# Patient Record
Sex: Male | Born: 1945 | Race: White | Hispanic: No | Marital: Married | Smoking: Never smoker
Health system: Southern US, Community
[De-identification: ages and names within clinical notes are randomized; demographics above are authoritative.]

## PROBLEM LIST (undated history)

## (undated) DIAGNOSIS — G629 Polyneuropathy, unspecified: Secondary | ICD-10-CM

## (undated) DIAGNOSIS — R011 Cardiac murmur, unspecified: Secondary | ICD-10-CM

## (undated) DIAGNOSIS — I359 Nonrheumatic aortic valve disorder, unspecified: Secondary | ICD-10-CM

## (undated) DIAGNOSIS — G8929 Other chronic pain: Secondary | ICD-10-CM

## (undated) DIAGNOSIS — E785 Hyperlipidemia, unspecified: Secondary | ICD-10-CM

## (undated) DIAGNOSIS — G4730 Sleep apnea, unspecified: Secondary | ICD-10-CM

## (undated) DIAGNOSIS — R519 Headache, unspecified: Secondary | ICD-10-CM

## (undated) DIAGNOSIS — M1711 Unilateral primary osteoarthritis, right knee: Secondary | ICD-10-CM

## (undated) DIAGNOSIS — K219 Gastro-esophageal reflux disease without esophagitis: Secondary | ICD-10-CM

## (undated) DIAGNOSIS — M1990 Unspecified osteoarthritis, unspecified site: Secondary | ICD-10-CM

## (undated) DIAGNOSIS — L570 Actinic keratosis: Secondary | ICD-10-CM

## (undated) DIAGNOSIS — Z8709 Personal history of other diseases of the respiratory system: Secondary | ICD-10-CM

## (undated) DIAGNOSIS — M549 Dorsalgia, unspecified: Secondary | ICD-10-CM

## (undated) DIAGNOSIS — R531 Weakness: Secondary | ICD-10-CM

## (undated) DIAGNOSIS — R51 Headache: Secondary | ICD-10-CM

## (undated) DIAGNOSIS — I2510 Atherosclerotic heart disease of native coronary artery without angina pectoris: Secondary | ICD-10-CM

## (undated) HISTORY — PX: CHOLECYSTECTOMY: SHX55

## (undated) HISTORY — PX: KNEE ARTHROSCOPY: SUR90

## (undated) HISTORY — PX: BACK SURGERY: SHX140

## (undated) HISTORY — PX: ESOPHAGOGASTRODUODENOSCOPY: SHX1529

## (undated) HISTORY — DX: Atherosclerotic heart disease of native coronary artery without angina pectoris: I25.10

## (undated) HISTORY — DX: Actinic keratosis: L57.0

## (undated) HISTORY — DX: Polyneuropathy, unspecified: G62.9

## (undated) HISTORY — PX: OTHER SURGICAL HISTORY: SHX169

## (undated) HISTORY — PX: COLONOSCOPY: SHX174

---

## 1970-10-12 HISTORY — PX: FRACTURE SURGERY: SHX138

## 2005-12-01 IMAGING — CT CT LMYELO
2 of 9 series · 5 of 33 positions shown, 6 images · non-contrast
Comparison: none

______________________________________________________________
BASIMA [DATE] BASIMA [DATE]

REASON FOR CONSULTATION: ROUTINE
____________________________________________
EXAM: SCREEN BREAST EXAM
BILATERAL MAMMOGRAM, [DATE]:
Comparison is made to study [DATE].
The breasts reveal an involutional pattern. A small amount of residual
dense fibroglandular tissue remains in the retroareolar regions. I see
no dominant mass nor malignant-appearing grouping of microcalcification.
 No new architectural distortion nor skin thickening is seen.

[Series 2: — · axial · 0.39mm/px · z∈[+1237,+1327]mm · 2 of 92 slices shown]
[im 31/92  bone]
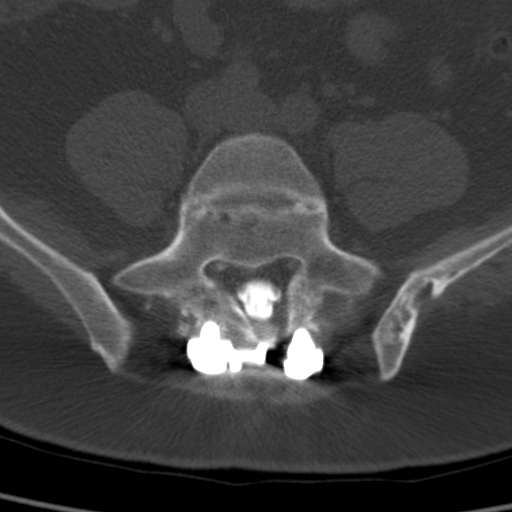
[im 61/92  bone]
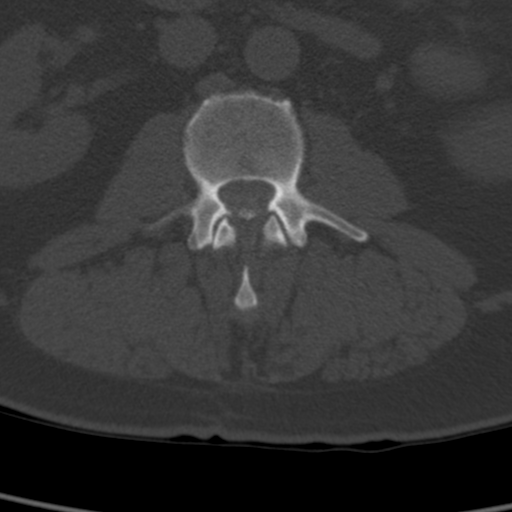

[Series 5: bone · axial · 0.39mm/px · z∈[+1213,+1351]mm · 3 of 92 slices shown, 4 images]
[im 23/92  soft-tissue]
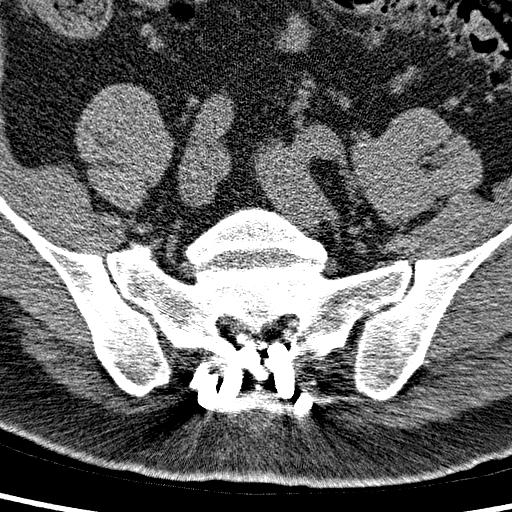
[im 23/92  bone]
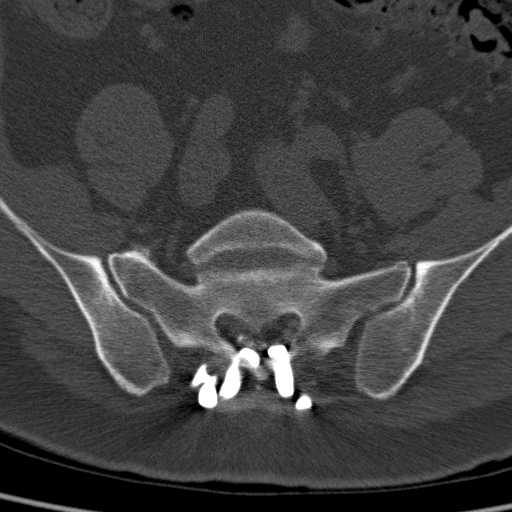
[im 46/92  bone]
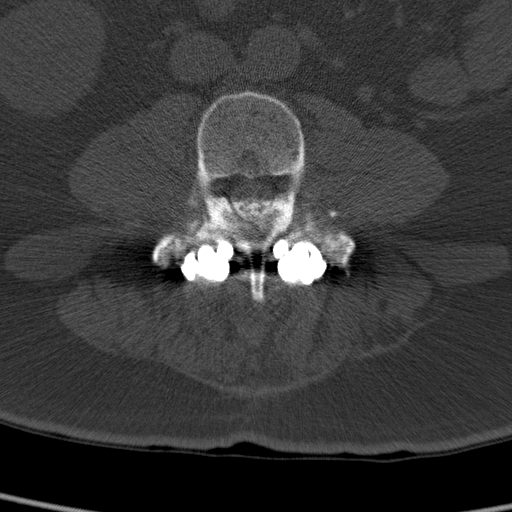
[im 69/92  bone]
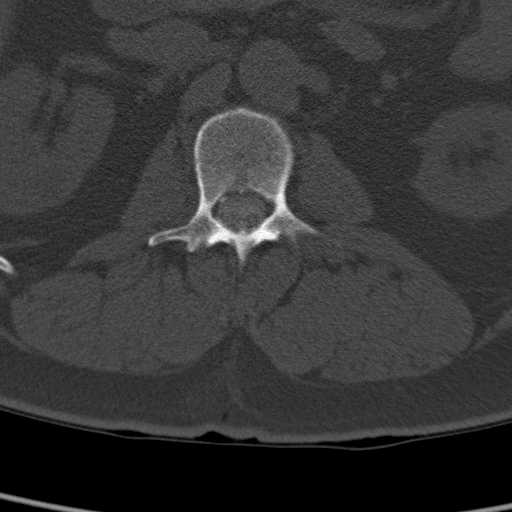

[5 of 33 positions shown; findings below may reference images not displayed]

IMPRESSION: Stable breasts with no findings suspicious for malignancy.

Recommendation: Please continue to encourage yearly follow-up.

## 2006-03-19 ENCOUNTER — Encounter (INDEPENDENT_AMBULATORY_CARE_PROVIDER_SITE_OTHER): Payer: Self-pay | Admitting: *Deleted

## 2006-03-19 ENCOUNTER — Ambulatory Visit (HOSPITAL_COMMUNITY): Admission: RE | Admit: 2006-03-19 | Discharge: 2006-03-19 | Payer: Self-pay | Admitting: *Deleted

## 2006-10-13 DIAGNOSIS — C4491 Basal cell carcinoma of skin, unspecified: Secondary | ICD-10-CM

## 2006-10-13 HISTORY — DX: Basal cell carcinoma of skin, unspecified: C44.91

## 2007-02-15 DIAGNOSIS — C4491 Basal cell carcinoma of skin, unspecified: Secondary | ICD-10-CM

## 2007-02-15 HISTORY — DX: Basal cell carcinoma of skin, unspecified: C44.91

## 2007-08-16 DIAGNOSIS — C4499 Other specified malignant neoplasm of skin, unspecified: Secondary | ICD-10-CM

## 2007-08-16 DIAGNOSIS — C4491 Basal cell carcinoma of skin, unspecified: Secondary | ICD-10-CM

## 2007-08-16 HISTORY — DX: Other specified malignant neoplasm of skin, unspecified: C44.99

## 2007-08-16 HISTORY — DX: Basal cell carcinoma of skin, unspecified: C44.91

## 2007-09-14 ENCOUNTER — Ambulatory Visit (HOSPITAL_COMMUNITY): Admission: RE | Admit: 2007-09-14 | Discharge: 2007-09-14 | Payer: Self-pay | Admitting: *Deleted

## 2007-09-14 ENCOUNTER — Encounter (INDEPENDENT_AMBULATORY_CARE_PROVIDER_SITE_OTHER): Payer: Self-pay | Admitting: *Deleted

## 2011-02-24 NOTE — Op Note (Signed)
NAMEGRAESYN, SCHREIFELS               ACCOUNT NO.:  [id number]   MEDICAL RECORD NO.:  [id number]          PATIENT TYPE:  AMB   LOCATION:  ENDO                         FACILITY:  [facility]   PHYSICIAN:  Georgiana Spinner, M.D.    DATE OF BIRTH:  [birth year]   DATE OF PROCEDURE:  DATE OF DISCHARGE:                               OPERATIVE REPORT   PROCEDURE:  Upper endoscopy with biopsy.   INDICATIONS:  GERD.   ANESTHESIA:  Fentanyl 75 mcg, Versed 8 mg, Benadryl 25 mg.   PROCEDURE:  With the patient mildly sedated in the left lateral  decubitus position,  the Pentax videoscopic endoscope was inserted in  the mouth, passed under direct vision through the esophagus which  appeared normal until we reached the distal esophagus and there was  changes of Barrett's photographed and biopsied.  We entered into the  stomach. The fundus, body, antrum, duodenal bulb, and second portion of  duodenum were visualized. From this point, the endoscope was slowly  withdrawn taking circumferential views of the remaining gastric and  esophageal mucosa.  The patient's vital signs and pulse oximeter  remained stable.  The patient tolerated the procedure well without  apparent complications.   FINDINGS:  Barrett's esophagus biopsied.  Await biopsy report.  The  patient will call me for results and follow-up with me as an outpatient.           ______________________________  Georgiana Spinner, M.D.     GMO/MEDQ  D:  09/14/2007  T:  09/14/2007  Job:  [id number]

## 2011-02-27 NOTE — Op Note (Signed)
[name], Jimmy Rangel               ACCOUNT NO.:  [id number]   MEDICAL RECORD NO.:  [id number]          PATIENT TYPE:  AMB   LOCATION:  ENDO                         FACILITY:  [facility]   PHYSICIAN:  Georgiana Spinner, M.D.    DATE OF BIRTH:  25-Jun-1946   DATE OF PROCEDURE:  03/19/2006  DATE OF DISCHARGE:                                 OPERATIVE REPORT   INDICATIONS:  GERD.   ANESTHESIA:  Demerol 75, Versed 7.5 mg.   PROCEDURE:  With the patient mildly sedated in the left lateral decubitus  position, the Olympus videoscopic endoscope was inserted in the mouth,  passed under direct vision through the esophagus which appeared normal until  we reached distal esophagus.  There was a question of short segment  Barrett's photographed and biopsied.  We entered into the stomach.  Fundus,  body, antrum, duodenal bulb, and second portion duodenum appeared normal.  From this point, the endoscope was slowly withdrawn, taking circumferential  views of duodenal mucosa until the endoscope was then pulled back in the  stomach, placed in retroflexion to view the stomach from below.  The  endoscope was straightened and withdrawn, taking circumferential views of  the remaining gastric and esophageal mucosa.  The patient's vital signs and  pulse oximeter remained stable.  The patient tolerated the procedure well  without apparent complications.   FINDINGS:  Question of Barrett's esophagus, biopsied.  Await biopsy report.  The patient will call me for results and follow-up with me as an outpatient.  Proceed to colonoscopy as planned.           ______________________________  Georgiana Spinner, M.D.     GMO/MEDQ  D:  03/19/2006  T:  03/19/2006  Job:  [id number]

## 2011-02-27 NOTE — Op Note (Signed)
Jimmy Rangel, Jimmy Rangel               ACCOUNT NO.:  [id number]   MEDICAL RECORD NO.:  [id number]          PATIENT TYPE:  AMB   LOCATION:  ENDO                         FACILITY:  [facility]   PHYSICIAN:  Georgiana Spinner, M.D.    DATE OF BIRTH:  10/08/1946   DATE OF PROCEDURE:  03/19/2006  DATE OF DISCHARGE:                                 OPERATIVE REPORT   PROCEDURE:  Colonoscopy   INDICATIONS:  Colon cancer screening.   ANESTHESIA:  Demerol 25, Versed 2.5 mg.   PROCEDURE:  With the patient mildly sedated in the left lateral decubitus  position, the Olympus videoscopic colonoscope was inserted into the rectum  after normal rectal exam; and passed under direct vision to the cecum,  identified by ileocecal valve and appendiceal orifice both of which were  photographed.  From this point the colonoscope was slowly withdrawn taking  circumferential views of colonic mucosa stopping only to photograph  diverticulosis seen in the right and left colon,  and also in the sigmoid  colon; where there was a change in the mucosa in the area of diverticulosis.  Thus, I elected to biopsy it; until we reached the rectum which appeared  normal on direct and showed hemorrhoids on retroflexed view.  The endoscope  was straightened and withdrawn.  The patient's vital signs and pulse  oximeter remained stable.  The patient tolerated the procedure well without  apparent complications.   FINDINGS:  Diverticulosis of right-and-left colon.  Change in the mucosa  lining in the sigmoid colon near the area of diverticulosis biopsied.  Await  biopsy report.  The patient will call me for results and follow up with me  as an outpatient.           ______________________________  Georgiana Spinner, M.D.     GMO/MEDQ  D:  03/19/2006  T:  03/19/2006  Job:  [id number]

## 2011-10-26 ENCOUNTER — Other Ambulatory Visit: Payer: Self-pay | Admitting: Dermatology

## 2013-02-27 ENCOUNTER — Other Ambulatory Visit: Payer: Self-pay | Admitting: Dermatology

## 2015-03-29 IMAGING — CR DG ABDOMEN 1V
2 series · 2 of 2 positions shown · non-contrast
Comparison: None.

CLINICAL DATA: Constipation.  Evaluate for small bowel obstruction.

EXAM:
ABDOMEN - 1 VIEW

[abdomen kub (1 of 2)]
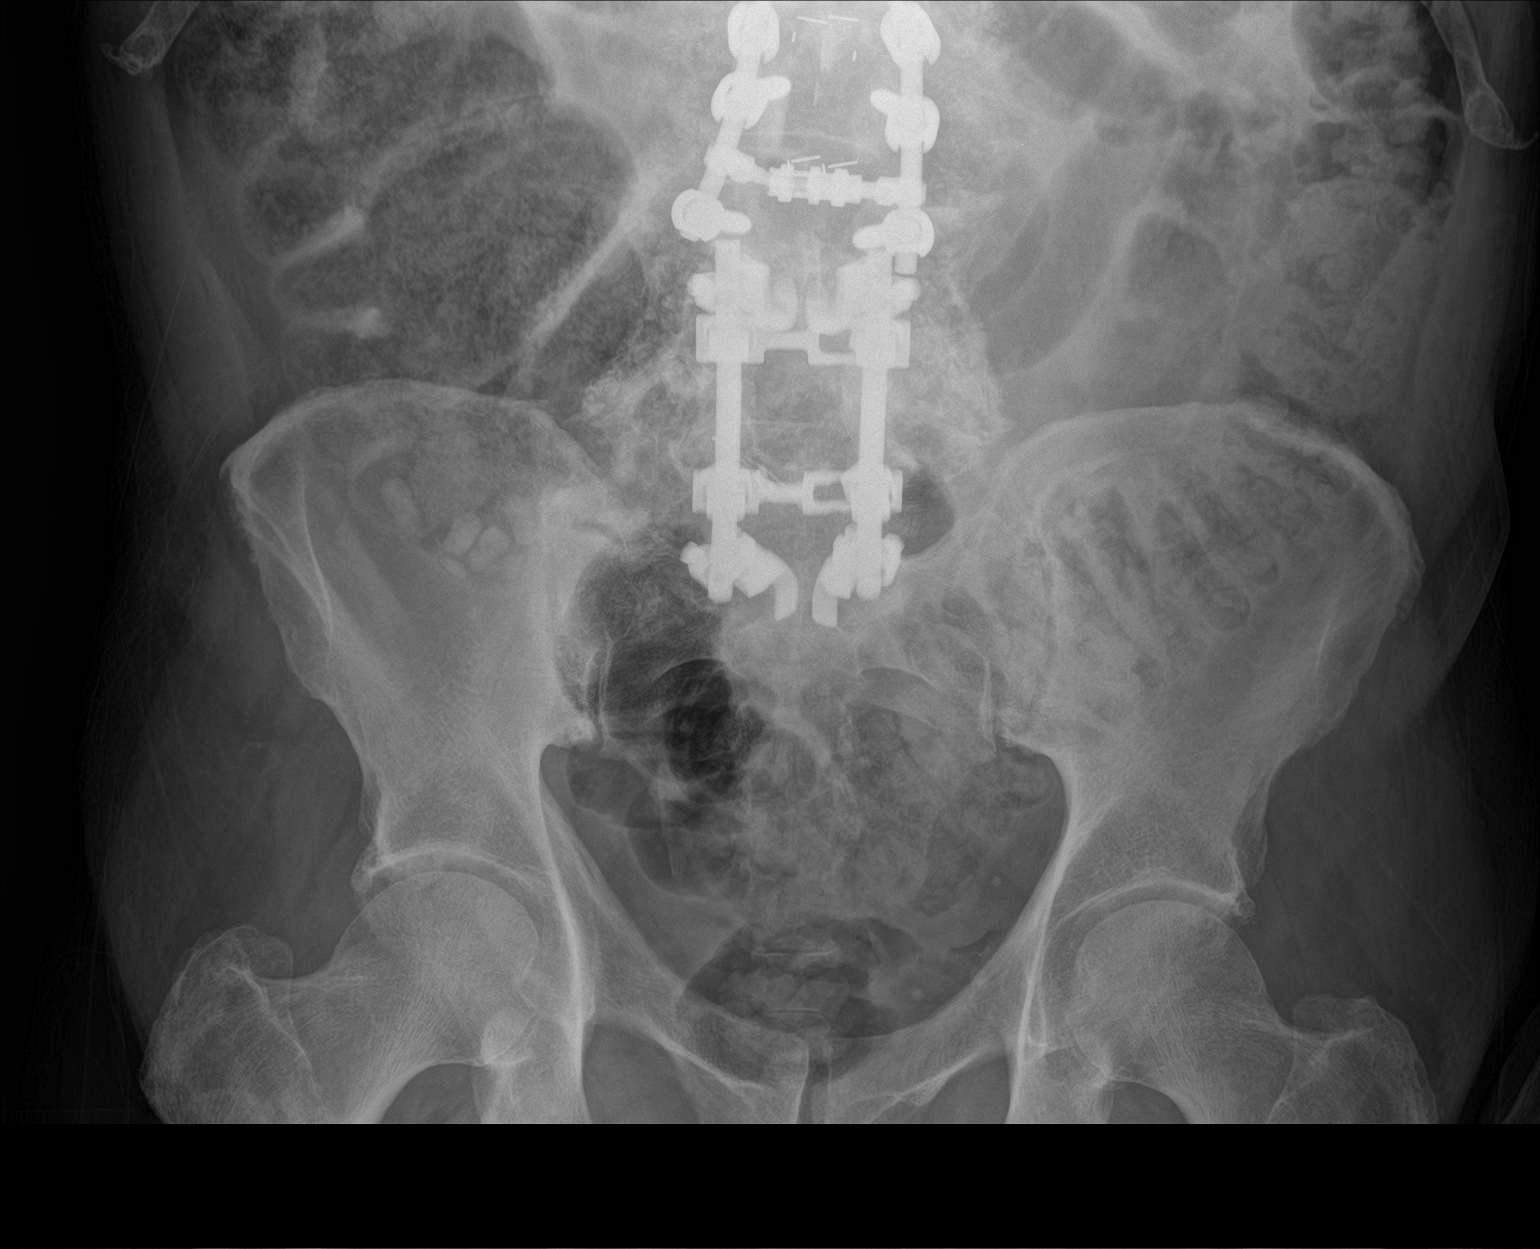

[abdomen kub (2 of 2)]
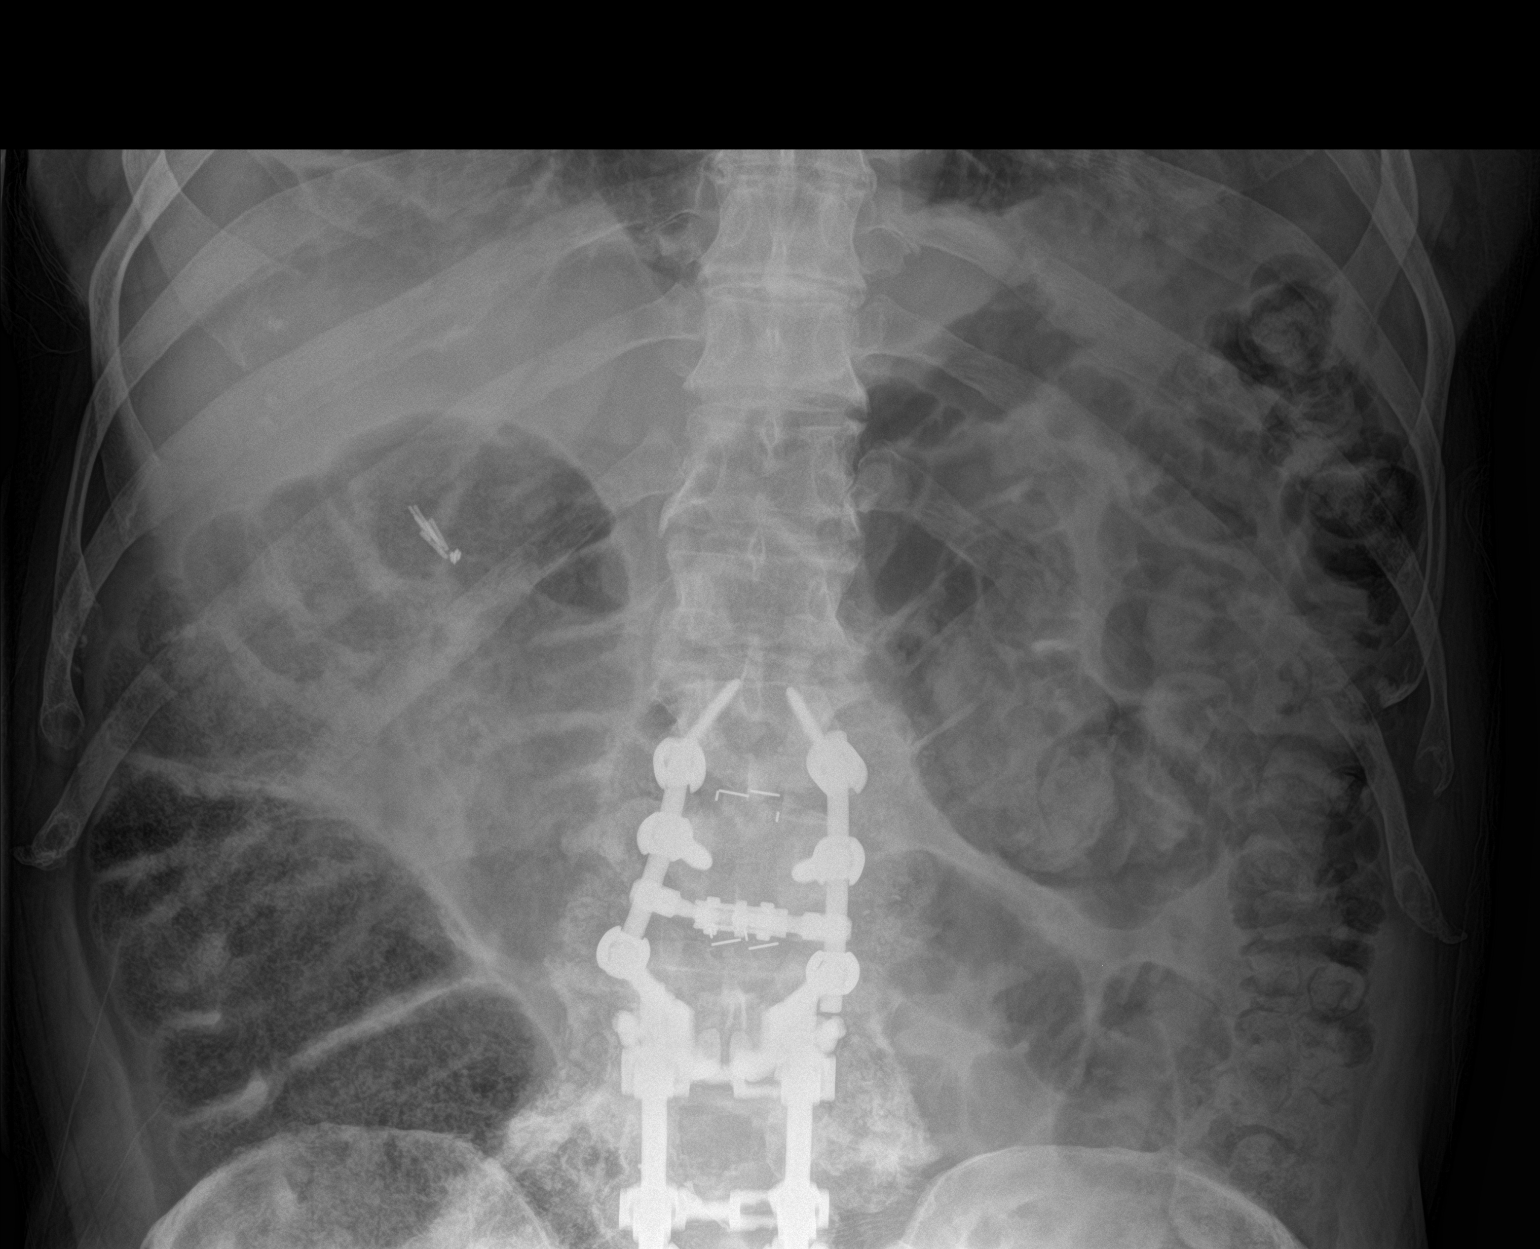

[2 of 2 positions shown; findings below may reference images not displayed]

FINDINGS: Two views of the abdomen and pelvis. Cholecystectomy. Lumbosacral
spine fixation. No free intraperitoneal air. Large colonic stool
burden. Colonic prominence throughout. No small bowel distension.
Distal gas in stool.
IMPRESSION: Possible constipation.

No evidence of bowel obstruction or free intraperitoneal air.

## 2015-04-09 ENCOUNTER — Other Ambulatory Visit: Payer: Self-pay | Admitting: Dermatology

## 2015-05-30 DIAGNOSIS — E7800 Pure hypercholesterolemia, unspecified: Secondary | ICD-10-CM | POA: Insufficient documentation

## 2016-02-19 ENCOUNTER — Other Ambulatory Visit: Payer: Self-pay | Admitting: Neurosurgery

## 2016-02-25 ENCOUNTER — Encounter (HOSPITAL_COMMUNITY)
Admission: RE | Admit: 2016-02-25 | Discharge: 2016-02-25 | Disposition: A | Discharge status: Home / Self Care | Payer: Medicare Other | Source: Ambulatory Visit | Attending: Neurosurgery | Admitting: Neurosurgery

## 2016-02-25 ENCOUNTER — Encounter (HOSPITAL_COMMUNITY): Payer: Self-pay

## 2016-02-25 HISTORY — DX: Weakness: R53.1

## 2016-02-25 HISTORY — DX: Personal history of other diseases of the respiratory system: Z87.09

## 2016-02-25 HISTORY — DX: Gastro-esophageal reflux disease without esophagitis: K21.9

## 2016-02-25 HISTORY — DX: Unspecified osteoarthritis, unspecified site: M19.90

## 2016-02-25 HISTORY — DX: Dorsalgia, unspecified: M54.9

## 2016-02-25 HISTORY — DX: Headache, unspecified: R51.9

## 2016-02-25 HISTORY — DX: Hyperlipidemia, unspecified: E78.5

## 2016-02-25 HISTORY — DX: Other chronic pain: G89.29

## 2016-02-25 HISTORY — DX: Headache: R51

## 2016-02-25 LAB — BASIC METABOLIC PANEL
Anion gap: 9 (ref 5–15)
BUN: 11 mg/dL (ref 6–20)
CALCIUM: 9.2 mg/dL (ref 8.9–10.3)
CO2: 26 mmol/L (ref 22–32)
CREATININE: 0.96 mg/dL (ref 0.61–1.24)
Chloride: 100 mmol/L — ABNORMAL LOW (ref 101–111)
GFR calc Af Amer: >60 mL/min (ref >60)
GFR calc non Af Amer: >60 mL/min (ref >60)
GLUCOSE: 123 mg/dL — AB (ref 65–99)
Potassium: 3.9 mmol/L (ref 3.5–5.1)
Sodium: 135 mmol/L (ref 135–145)

## 2016-02-25 LAB — TYPE AND SCREEN
ABO/RH(D): A NEG
Antibody Screen: NEGATIVE

## 2016-02-25 LAB — CBC
HCT: 50.5 % (ref 39.0–52.0)
HEMOGLOBIN: 16.7 g/dL (ref 13.0–17.0)
MCH: 32.1 pg (ref 26.0–34.0)
MCHC: 33.1 g/dL (ref 30.0–36.0)
MCV: 97.1 fL (ref 78.0–100.0)
Platelets: 344 10*3/uL (ref 150–400)
RBC: 5.2 MIL/uL (ref 4.22–5.81)
RDW: 14.8 % (ref 11.5–15.5)
WBC: 6.9 10*3/uL (ref 4.0–10.5)

## 2016-02-25 LAB — SURGICAL PCR SCREEN
MRSA, PCR: POSITIVE — AB
Staphylococcus aureus: POSITIVE — AB

## 2016-02-25 LAB — ABO/RH: ABO/RH(D): A NEG

## 2016-02-25 NOTE — Progress Notes (Signed)
Mupirocin script called into the [facility] on [address].

## 2016-02-25 NOTE — Pre-Procedure Instructions (Signed)
GARR SUDBURY  02/25/2016      [id number] [address], [place] [id number] [place] [address] Phone: [phone] Fax: [phone]    Your procedure is scheduled on Thurs, May 18 @ 10:00 AM  Report to [facility] Admitting at 7:00 AM  Call this number if you have problems the morning of surgery:  [phone]   Remember:  Do not eat food or drink liquids after midnight.  Take these medicines the morning of surgery with A SIP OF WATER Pain Pill(if needed) and Omeprazole(Prilosec)              Stop taking your Diclofenac along with any Vitamins or Herbal Medications. No Goody's,BC's,Aleve,Advil,Motrin,Ibuprofen,or Fish Oil.    Do not wear jewelry.  Do not wear lotions, powders, or colognes.               Men may shave face and neck.  Do not bring valuables to the hospital.  [facility] is not responsible for any belongings or valuables.  Contacts, dentures or bridgework may not be worn into surgery.  Leave your suitcase in the car.  After surgery it may be brought to your room.  For patients admitted to the hospital, discharge time will be determined by your treatment team.  Patients discharged the day of surgery will not be allowed to drive home.    [facility]  Before surgery, you can play an important role.  Because skin is not sterile, your skin needs to be as free of germs as possible.  You can reduce the number of germs on you skin by washing with CHG (chlorahexidine gluconate) soap before surgery.  CHG is an antiseptic cleaner which kills germs and bonds with the skin to continue killing germs even after washing.  Please DO NOT use if you have an allergy to CHG or antibacterial soaps.  If your skin becomes reddened/irritated stop using the CHG and inform your nurse when you arrive at Short Stay.  Do not shave (including legs and underarms) for at  least 48 hours prior to the first CHG shower.  You may shave your face.  Please follow these instructions carefully:   1.  Shower with CHG Soap the night before surgery and the                                morning of Surgery.  2.  If you choose to wash your hair, wash your hair first as usual with your       normal shampoo.  3.  After you shampoo, rinse your hair and body thoroughly to remove the                      Shampoo.  4.  Use CHG as you would any other liquid soap.  You can apply chg directly       to the skin and wash gently with scrungie or a clean washcloth.  5.  Apply the CHG Soap to your body ONLY FROM THE NECK DOWN.        Do not use on open wounds or open sores.  Avoid contact with your eyes,       ears, mouth and genitals (private parts).  Wash genitals (private parts)       with  your normal soap.  6.  Wash thoroughly, paying special attention to the area where your surgery        will be performed.  7.  Thoroughly rinse your body with warm water from the neck down.  8.  DO NOT shower/wash with your normal soap after using and rinsing off       the CHG Soap.  9.  Pat yourself dry with a clean towel.            10.  Wear clean pajamas.            11.  Place clean sheets on your bed the night of your first shower and do not        sleep with pets.  Day of Surgery  Do not apply any lotions/deoderants the morning of surgery.  Please wear clean clothes to the hospital/surgery center.  ons:    Please read over the following fact sheets that you were given. Pain Booklet, Coughing and Deep Breathing, Blood Transfusion Information, MRSA Information and Surgical Site Infection Prevention

## 2016-02-25 NOTE — Progress Notes (Signed)
Cardiologist denies  Medical Md is Dr.Aidenn Melford Aase  Echo/stress test > 12 yrs ago  Heart cath denies  EKG denies  CXR denies

## 2016-02-26 MED ORDER — CEFAZOLIN SODIUM-DEXTROSE 2-4 GM/100ML-% IV SOLN
2.0000 g | INTRAVENOUS | Status: AC
  Administered 2016-02-27 (×2): 2 g via INTRAVENOUS
  Filled 2016-02-26: qty 100

## 2016-02-27 ENCOUNTER — Encounter (HOSPITAL_COMMUNITY): Payer: Self-pay | Admitting: *Deleted

## 2016-02-27 ENCOUNTER — Inpatient Hospital Stay (HOSPITAL_COMMUNITY): Payer: Medicare Other | Admitting: Certified Registered"

## 2016-02-27 ENCOUNTER — Encounter (HOSPITAL_COMMUNITY)
Admission: AD | Disposition: A | Discharge status: Home / Self Care | Payer: Self-pay | Source: Ambulatory Visit | Attending: Neurosurgery

## 2016-02-27 ENCOUNTER — Inpatient Hospital Stay (HOSPITAL_COMMUNITY): Payer: Medicare Other

## 2016-02-27 ENCOUNTER — Inpatient Hospital Stay (HOSPITAL_COMMUNITY)
Admission: AD | Admit: 2016-02-27 | Discharge: 2016-02-29 | DRG: 460 | Disposition: A | Discharge status: Home / Self Care | Payer: Medicare Other | Source: Ambulatory Visit | Attending: Neurosurgery | Admitting: Neurosurgery

## 2016-02-27 DIAGNOSIS — Z419 Encounter for procedure for purposes other than remedying health state, unspecified: Secondary | ICD-10-CM

## 2016-02-27 DIAGNOSIS — M4726 Other spondylosis with radiculopathy, lumbar region: Secondary | ICD-10-CM | POA: Diagnosis present

## 2016-02-27 DIAGNOSIS — M48062 Spinal stenosis, lumbar region with neurogenic claudication: Secondary | ICD-10-CM | POA: Diagnosis present

## 2016-02-27 DIAGNOSIS — Z01812 Encounter for preprocedural laboratory examination: Secondary | ICD-10-CM

## 2016-02-27 DIAGNOSIS — Z981 Arthrodesis status: Secondary | ICD-10-CM

## 2016-02-27 DIAGNOSIS — M5116 Intervertebral disc disorders with radiculopathy, lumbar region: Principal | ICD-10-CM | POA: Diagnosis present

## 2016-02-27 DIAGNOSIS — K219 Gastro-esophageal reflux disease without esophagitis: Secondary | ICD-10-CM | POA: Diagnosis present

## 2016-02-27 DIAGNOSIS — Z0183 Encounter for blood typing: Secondary | ICD-10-CM

## 2016-02-27 DIAGNOSIS — E785 Hyperlipidemia, unspecified: Secondary | ICD-10-CM | POA: Diagnosis present

## 2016-02-27 DIAGNOSIS — M4806 Spinal stenosis, lumbar region: Secondary | ICD-10-CM | POA: Diagnosis present

## 2016-02-27 IMAGING — RF DG C-ARM 61-120 MIN
1 series · 3 of 3 positions shown · non-contrast
Comparison: Lumbar MRI [DATE].

CLINICAL DATA: 70-year-old male undergoing lumbar spine surgery.
Initial encounter.

EXAM:
DG C-ARM 61-120 MIN; LUMBAR SPINE - 2-3 VIEW

[Series 1: run · 3 of 3 slices shown]
[im 1/3]
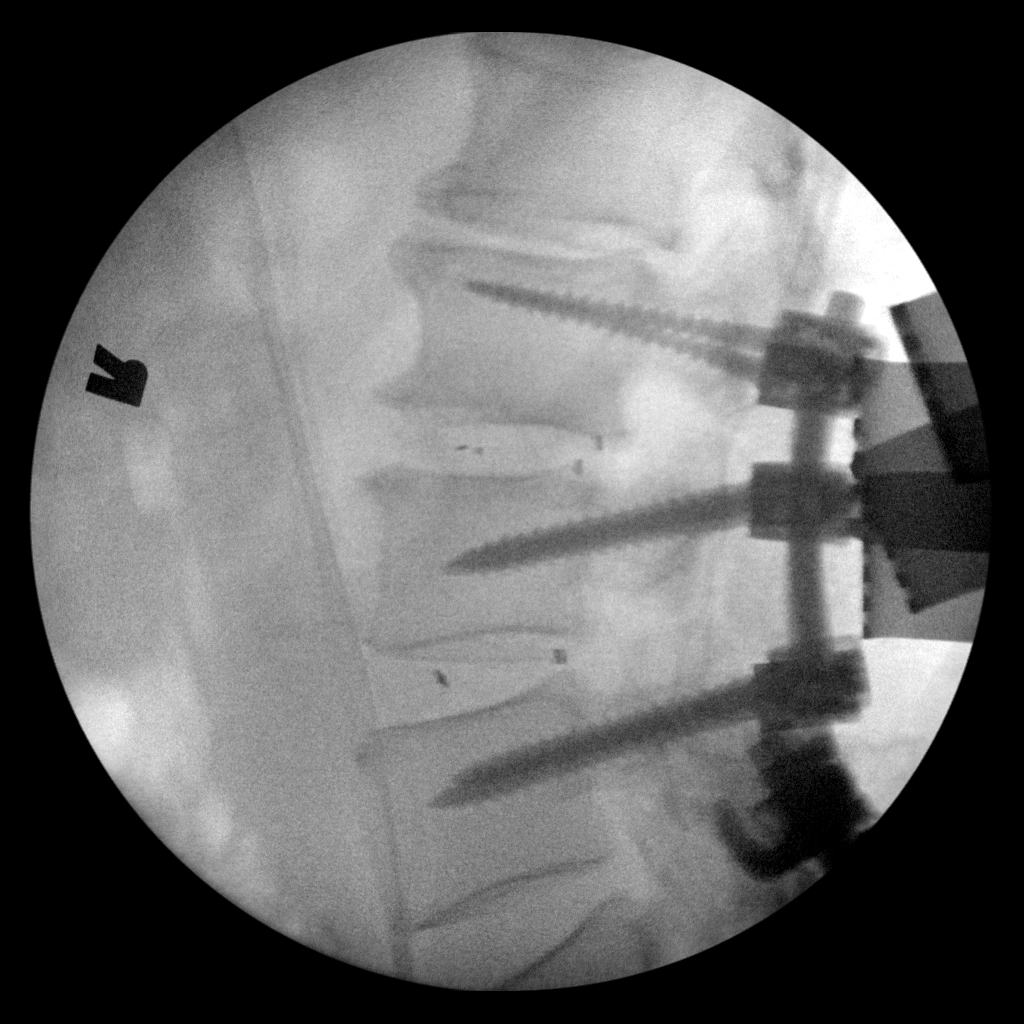
[im 2/3]
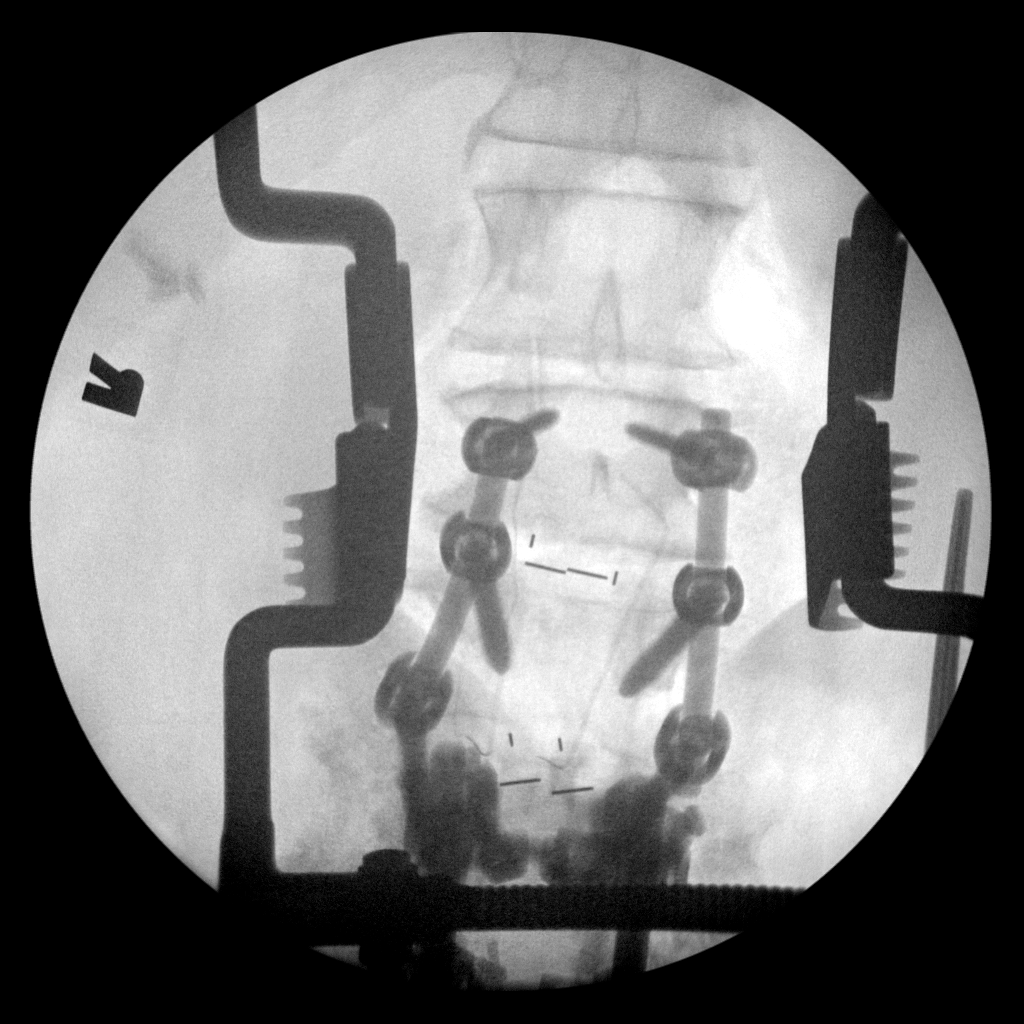
[im 3/3]
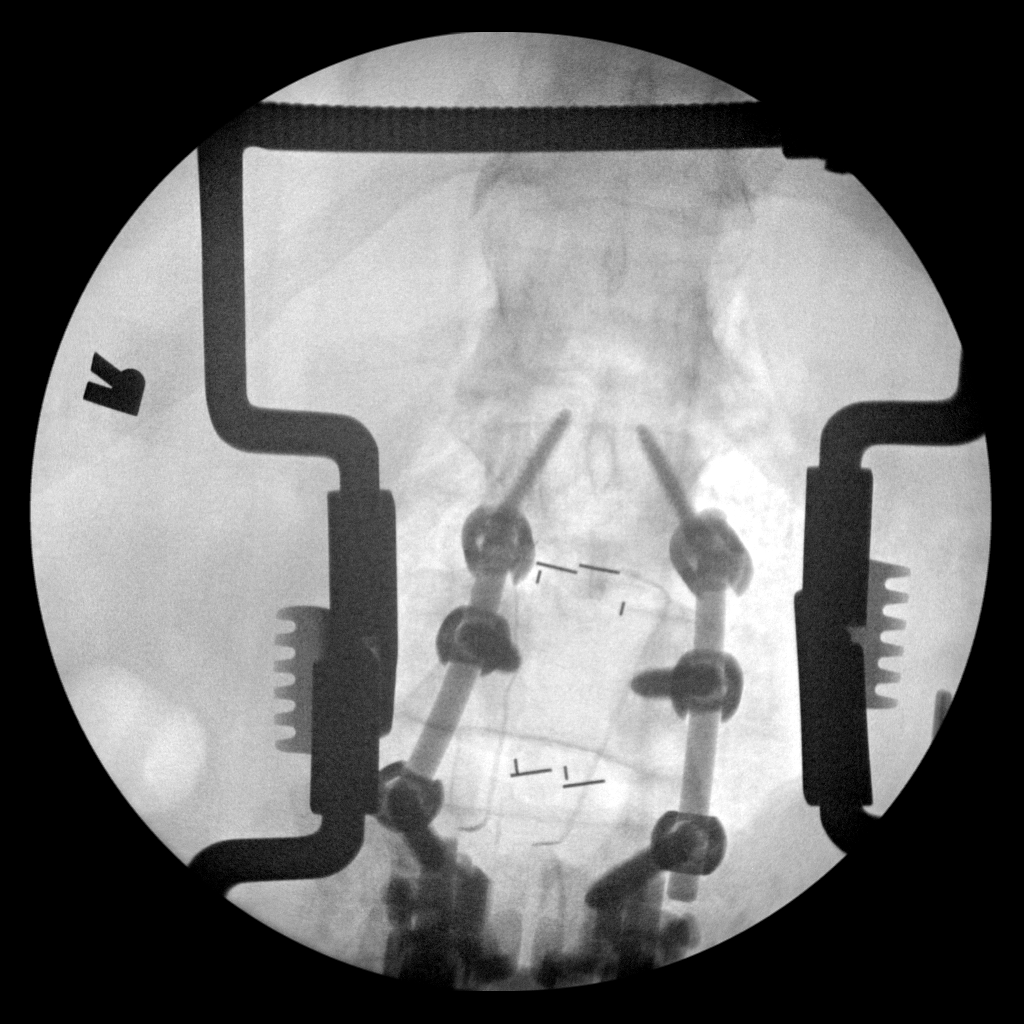

[3 of 3 positions shown; findings below may reference images not displayed]

Intraoperative image 110 4 hours
today.

Also there are thoracic and lumbar spine radiographs from [HOSPITAL]
neurosurgery dated [DATE].
FINDINGS: 3 intraoperative fluoroscopic views of the lumbar spine in the AP
and lateral projections.

The numbering system on the previous MRI and today intraoperative
films designate the level of chronic anterolisthesis as L4-L5, with
a sacralized L1 level.

However, the comparison thoracic and lumbar radiographs from
[DATE] suggest the lowest full size ribs are T12 and the S1
level is lumbarized, resulting in the level of chronic
anterolisthesis as L5-S1.

By the later numbering system, the fusion depicted on these images
is at L2-L3 and L3-L4, with transpedicular and interbody implants.
IMPRESSION: Multilevel lumbar posterior and interbody fusion hardware placed.
Note Transitional anatomy, with different numbering systems on the
recent comparison MRI versus the numbering suggested on thoracic and
lumbar radiographs performed [DATE].

## 2016-02-27 IMAGING — CR DG LUMBAR SPINE 2-3V
2 series · 2 of 2 positions shown · non-contrast
Comparison: [DATE] lumbar spine MRI.

CLINICAL DATA: L2-3 and L3-4 decompression and PLIF.

EXAM:
LUMBAR SPINE - 2-3 VIEW

[lat (1 of 2)]
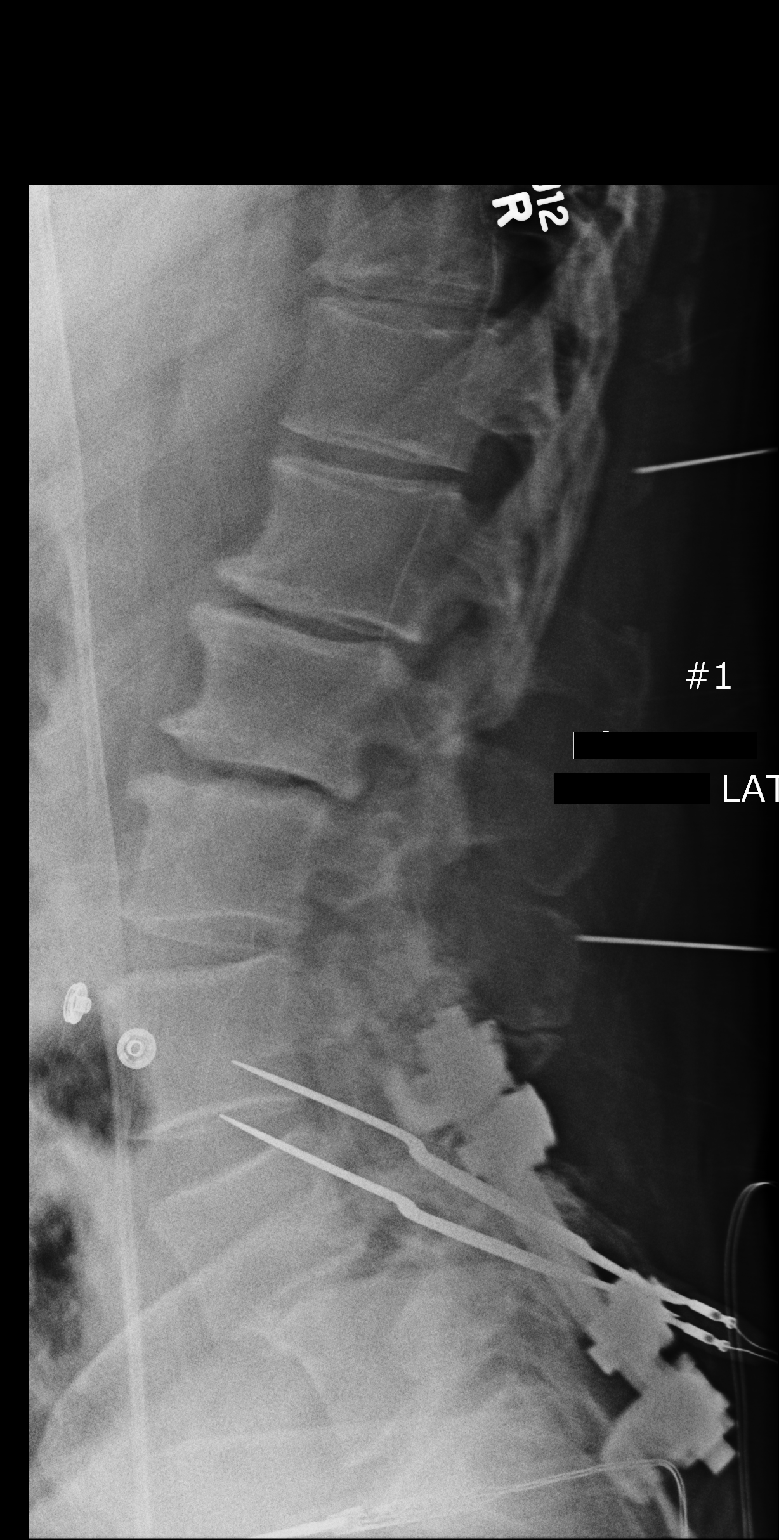

[lat (2 of 2)]
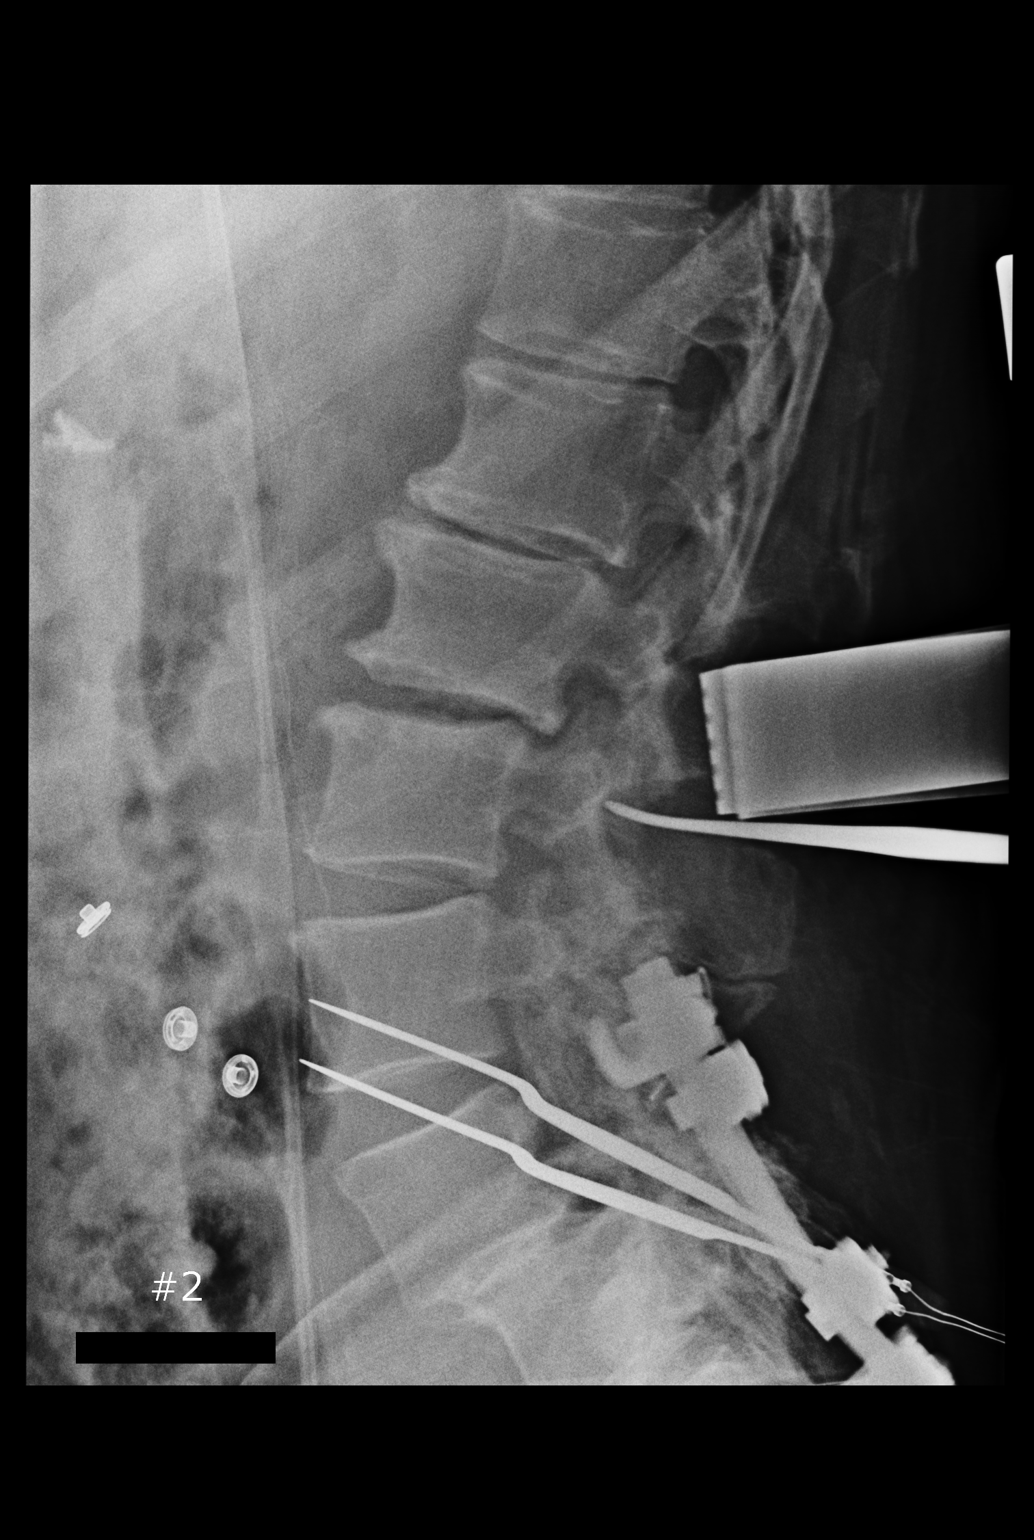

[2 of 2 positions shown; findings below may reference images not displayed]

FINDINGS: Initial portable cross-table lateral intraoperative radiograph
demonstrates 2 posterior approach surgical marking devices, the more
superior of which terminates over the T11 spinous process in the
more inferior of which terminates over the posterior upper L2
spinous process. Posterior spinal fusion hardware is seen from
L3-S1. Additional surgical instruments overlie the lower lumbar
spine.

Subsequent portable cross-table lateral intraoperative radiograph
demonstrates single posterior approach surgical marking device
terminating over the upper L2 lamina. Posterior spinal fusion
hardware is seen from L3-S1. Additional surgical instruments overlie
the lower lumbar spine.
IMPRESSION: Posterior approach surgical marking device positions as described.

## 2016-02-27 SURGERY — POSTERIOR LUMBAR FUSION 2 LEVEL
Anesthesia: General | Site: Back

## 2016-02-27 MED ORDER — ACETAMINOPHEN 10 MG/ML IV SOLN
INTRAVENOUS | Status: AC
  Administered 2016-02-27: 1000 mg via INTRAVENOUS
  Filled 2016-02-27: qty 100

## 2016-02-27 MED ORDER — ONDANSETRON HCL 4 MG/2ML IJ SOLN: 4.0000 mg | Freq: Once | INTRAMUSCULAR | Status: DC | PRN

## 2016-02-27 MED ORDER — FENTANYL CITRATE (PF) 250 MCG/5ML IJ SOLN
INTRAMUSCULAR | Status: AC
  Filled 2016-02-27: qty 5

## 2016-02-27 MED ORDER — LIDOCAINE-EPINEPHRINE 1 %-1:100000 IJ SOLN
INTRAMUSCULAR | Status: DC | PRN
  Administered 2016-02-27: 15 mL

## 2016-02-27 MED ORDER — ACETAMINOPHEN 325 MG PO TABS: 650.0000 mg | ORAL_TABLET | ORAL | Status: DC | PRN

## 2016-02-27 MED ORDER — ALUM & MAG HYDROXIDE-SIMETH 200-200-20 MG/5ML PO SUSP: 30.0000 mL | Freq: Four times a day (QID) | ORAL | Status: DC | PRN

## 2016-02-27 MED ORDER — ATORVASTATIN CALCIUM 10 MG PO TABS
10.0000 mg | ORAL_TABLET | Freq: Every day | ORAL | Status: DC
  Administered 2016-02-28: 10 mg via ORAL
  Filled 2016-02-27 (×3): qty 1

## 2016-02-27 MED ORDER — PHENOL 1.4 % MT LIQD: 1.0000 | OROMUCOSAL | Status: DC | PRN

## 2016-02-27 MED ORDER — ROCURONIUM BROMIDE 100 MG/10ML IV SOLN
INTRAVENOUS | Status: DC | PRN
  Administered 2016-02-27: 20 mg via INTRAVENOUS
  Administered 2016-02-27: 50 mg via INTRAVENOUS
  Administered 2016-02-27: 10 mg via INTRAVENOUS

## 2016-02-27 MED ORDER — KETOROLAC TROMETHAMINE 30 MG/ML IJ SOLN
15.0000 mg | Freq: Once | INTRAMUSCULAR | Status: AC
  Administered 2016-02-27: 15 mg via INTRAVENOUS

## 2016-02-27 MED ORDER — MAGNESIUM HYDROXIDE 400 MG/5ML PO SUSP: 30.0000 mL | Freq: Every day | ORAL | Status: DC | PRN

## 2016-02-27 MED ORDER — ALBUMIN HUMAN 5 % IV SOLN
INTRAVENOUS | Status: DC | PRN
  Administered 2016-02-27 (×2): via INTRAVENOUS

## 2016-02-27 MED ORDER — FENTANYL CITRATE (PF) 100 MCG/2ML IJ SOLN
INTRAMUSCULAR | Status: DC | PRN
  Administered 2016-02-27 (×10): 50 ug via INTRAVENOUS

## 2016-02-27 MED ORDER — SODIUM CHLORIDE 0.9 % IV SOLN
500.0000 mg | INTRAVENOUS | Status: AC
  Filled 2016-02-27: qty 500

## 2016-02-27 MED ORDER — GLYCOPYRROLATE 0.2 MG/ML IJ SOLN
INTRAMUSCULAR | Status: DC | PRN
  Administered 2016-02-27 (×2): 0.2 mg via INTRAVENOUS

## 2016-02-27 MED ORDER — DEXAMETHASONE SODIUM PHOSPHATE 10 MG/ML IJ SOLN
INTRAMUSCULAR | Status: DC | PRN
  Administered 2016-02-27: 8 mg via INTRAVENOUS

## 2016-02-27 MED ORDER — NEOSTIGMINE METHYLSULFATE 5 MG/5ML IV SOSY
PREFILLED_SYRINGE | INTRAVENOUS | Status: AC
  Filled 2016-02-27: qty 5

## 2016-02-27 MED ORDER — THROMBIN 5000 UNITS EX SOLR
CUTANEOUS | Status: DC | PRN
  Administered 2016-02-27: 16:00:00 via TOPICAL

## 2016-02-27 MED ORDER — CYCLOBENZAPRINE HCL 10 MG PO TABS
10.0000 mg | ORAL_TABLET | Freq: Three times a day (TID) | ORAL | Status: DC | PRN
  Administered 2016-02-27 – 2016-02-28 (×3): 10 mg via ORAL
  Filled 2016-02-27 (×2): qty 1

## 2016-02-27 MED ORDER — HYDROXYZINE HCL 50 MG/ML IM SOLN: 50.0000 mg | INTRAMUSCULAR | Status: DC | PRN

## 2016-02-27 MED ORDER — PROPOFOL 10 MG/ML IV BOLUS
INTRAVENOUS | Status: AC
  Filled 2016-02-27: qty 20

## 2016-02-27 MED ORDER — OXYCODONE HCL 5 MG PO TABS: 5.0000 mg | ORAL_TABLET | Freq: Once | ORAL | Status: DC | PRN

## 2016-02-27 MED ORDER — OXYCODONE-ACETAMINOPHEN 5-325 MG PO TABS
1.0000 | ORAL_TABLET | ORAL | Status: DC | PRN
  Administered 2016-02-27 – 2016-02-29 (×7): 2 via ORAL
  Filled 2016-02-27 (×7): qty 2

## 2016-02-27 MED ORDER — SODIUM CHLORIDE 0.9 % IR SOLN
Status: DC | PRN
  Administered 2016-02-27: 12:00:00

## 2016-02-27 MED ORDER — HYDROCODONE-ACETAMINOPHEN 5-325 MG PO TABS: 1.0000 | ORAL_TABLET | ORAL | Status: DC | PRN

## 2016-02-27 MED ORDER — VANCOMYCIN HCL IN DEXTROSE 1-5 GM/200ML-% IV SOLN
1000.0000 mg | INTRAVENOUS | Status: AC
  Filled 2016-02-27: qty 200

## 2016-02-27 MED ORDER — ROCURONIUM BROMIDE 50 MG/5ML IV SOLN
INTRAVENOUS | Status: AC
  Filled 2016-02-27: qty 1

## 2016-02-27 MED ORDER — PHENYLEPHRINE HCL 10 MG/ML IJ SOLN
INTRAMUSCULAR | Status: DC | PRN
  Administered 2016-02-27: 80 ug via INTRAVENOUS

## 2016-02-27 MED ORDER — HYDROXYZINE HCL 25 MG PO TABS: 50.0000 mg | ORAL_TABLET | ORAL | Status: DC | PRN

## 2016-02-27 MED ORDER — ZOLPIDEM TARTRATE 5 MG PO TABS: 5.0000 mg | ORAL_TABLET | Freq: Every evening | ORAL | Status: DC | PRN

## 2016-02-27 MED ORDER — SODIUM CHLORIDE 0.9% FLUSH
3.0000 mL | Freq: Two times a day (BID) | INTRAVENOUS | Status: DC
  Administered 2016-02-28 (×3): 3 mL via INTRAVENOUS

## 2016-02-27 MED ORDER — BISACODYL 10 MG RE SUPP: 10.0000 mg | Freq: Every day | RECTAL | Status: DC | PRN

## 2016-02-27 MED ORDER — ONDANSETRON HCL 4 MG/2ML IJ SOLN
INTRAMUSCULAR | Status: DC | PRN
  Administered 2016-02-27: 4 mg via INTRAVENOUS

## 2016-02-27 MED ORDER — PHENYLEPHRINE HCL 10 MG/ML IJ SOLN
10.0000 mg | INTRAMUSCULAR | Status: DC | PRN
  Administered 2016-02-27: 30 ug/min via INTRAVENOUS

## 2016-02-27 MED ORDER — MUPIROCIN 2 % EX OINT
1.0000 | TOPICAL_OINTMENT | Freq: Two times a day (BID) | CUTANEOUS | Status: DC
Start: 2016-02-27 — End: 2016-02-29
  Administered 2016-02-27 – 2016-02-28 (×3): 1 via NASAL
  Filled 2016-02-27 (×2): qty 22

## 2016-02-27 MED ORDER — LACTATED RINGERS IV SOLN
INTRAVENOUS | Status: DC
  Administered 2016-02-27 (×3): via INTRAVENOUS

## 2016-02-27 MED ORDER — NEOSTIGMINE METHYLSULFATE 10 MG/10ML IV SOLN
INTRAVENOUS | Status: DC | PRN
  Administered 2016-02-27: 2 mg via INTRAVENOUS

## 2016-02-27 MED ORDER — ACETAMINOPHEN 650 MG RE SUPP: 650.0000 mg | RECTAL | Status: DC | PRN

## 2016-02-27 MED ORDER — KETOROLAC TROMETHAMINE 30 MG/ML IJ SOLN
15.0000 mg | Freq: Four times a day (QID) | INTRAMUSCULAR | Status: DC
  Administered 2016-02-27 – 2016-02-29 (×6): 15 mg via INTRAVENOUS
  Filled 2016-02-27 (×6): qty 1

## 2016-02-27 MED ORDER — CHLORHEXIDINE GLUCONATE CLOTH 2 % EX PADS
6.0000 | MEDICATED_PAD | Freq: Every day | CUTANEOUS | Status: DC
  Administered 2016-02-28 – 2016-02-29 (×2): 6 via TOPICAL

## 2016-02-27 MED ORDER — MORPHINE SULFATE (PF) 4 MG/ML IV SOLN: 4.0000 mg | INTRAVENOUS | Status: DC | PRN

## 2016-02-27 MED ORDER — MUPIROCIN 2 % EX OINT: 1.0000 "application " | TOPICAL_OINTMENT | Freq: Two times a day (BID) | CUTANEOUS | Status: DC

## 2016-02-27 MED ORDER — KETOROLAC TROMETHAMINE 30 MG/ML IJ SOLN
INTRAMUSCULAR | Status: AC
  Filled 2016-02-27: qty 1

## 2016-02-27 MED ORDER — PROPOFOL 10 MG/ML IV BOLUS
INTRAVENOUS | Status: DC | PRN
  Administered 2016-02-27: 130 mg via INTRAVENOUS
  Administered 2016-02-27: 70 mg via INTRAVENOUS

## 2016-02-27 MED ORDER — LIDOCAINE HCL (CARDIAC) 20 MG/ML IV SOLN
INTRAVENOUS | Status: DC | PRN
  Administered 2016-02-27: 100 mg via INTRAVENOUS

## 2016-02-27 MED ORDER — ONDANSETRON HCL 4 MG/2ML IJ SOLN: 4.0000 mg | Freq: Four times a day (QID) | INTRAMUSCULAR | Status: DC | PRN

## 2016-02-27 MED ORDER — MENTHOL 3 MG MT LOZG: 1.0000 | LOZENGE | OROMUCOSAL | Status: DC | PRN

## 2016-02-27 MED ORDER — 0.9 % SODIUM CHLORIDE (POUR BTL) OPTIME
TOPICAL | Status: DC | PRN
  Administered 2016-02-27 (×3): 1000 mL

## 2016-02-27 MED ORDER — KCL IN DEXTROSE-NACL 20-5-0.45 MEQ/L-%-% IV SOLN
INTRAVENOUS | Status: DC
  Filled 2016-02-27 (×4): qty 1000

## 2016-02-27 MED ORDER — OXYCODONE HCL 5 MG/5ML PO SOLN
5.0000 mg | Freq: Once | ORAL | Status: DC | PRN
Start: 2016-02-27 — End: 2016-02-27

## 2016-02-27 MED ORDER — THROMBIN 20000 UNITS EX SOLR
CUTANEOUS | Status: DC | PRN
  Administered 2016-02-27: 12:00:00 via TOPICAL

## 2016-02-27 MED ORDER — HYDROMORPHONE HCL 1 MG/ML IJ SOLN
INTRAMUSCULAR | Status: AC
  Filled 2016-02-27: qty 1

## 2016-02-27 MED ORDER — LIDOCAINE 2% (20 MG/ML) 5 ML SYRINGE
INTRAMUSCULAR | Status: AC
  Filled 2016-02-27: qty 5

## 2016-02-27 MED ORDER — MUPIROCIN 2 % EX OINT
TOPICAL_OINTMENT | CUTANEOUS | Status: AC
  Administered 2016-02-27: 08:00:00
  Filled 2016-02-27: qty 22

## 2016-02-27 MED ORDER — ONDANSETRON HCL 4 MG/2ML IJ SOLN
INTRAMUSCULAR | Status: AC
  Filled 2016-02-27: qty 2

## 2016-02-27 MED ORDER — PANTOPRAZOLE SODIUM 40 MG PO TBEC
40.0000 mg | DELAYED_RELEASE_TABLET | Freq: Every day | ORAL | Status: DC
  Administered 2016-02-28: 40 mg via ORAL
  Filled 2016-02-27 (×2): qty 1

## 2016-02-27 MED ORDER — HYDROMORPHONE HCL 1 MG/ML IJ SOLN
0.2500 mg | INTRAMUSCULAR | Status: DC | PRN
  Administered 2016-02-27: 0.5 mg via INTRAVENOUS

## 2016-02-27 MED ORDER — SODIUM CHLORIDE 0.9 % IV SOLN: 250.0000 mL | INTRAVENOUS | Status: DC

## 2016-02-27 MED ORDER — CYCLOBENZAPRINE HCL 10 MG PO TABS
ORAL_TABLET | ORAL | Status: AC
  Filled 2016-02-27: qty 1

## 2016-02-27 MED ORDER — SODIUM CHLORIDE 0.9% FLUSH: 3.0000 mL | INTRAVENOUS | Status: DC | PRN

## 2016-02-27 MED ORDER — DEXAMETHASONE SODIUM PHOSPHATE 10 MG/ML IJ SOLN
INTRAMUSCULAR | Status: AC
  Filled 2016-02-27: qty 1

## 2016-02-27 MED ORDER — VANCOMYCIN HCL IN DEXTROSE 1-5 GM/200ML-% IV SOLN
INTRAVENOUS | Status: AC
  Administered 2016-02-27: 1500 mg via INTRAVENOUS
  Filled 2016-02-27: qty 200

## 2016-02-27 MED ORDER — BUPIVACAINE HCL (PF) 0.5 % IJ SOLN
INTRAMUSCULAR | Status: DC | PRN
  Administered 2016-02-27: 15 mL

## 2016-02-27 SURGICAL SUPPLY — 91 items
ADH SKN CLS APL DERMABOND .7 (GAUZE/BANDAGES/DRESSINGS) ×3
APL SKNCLS STERI-STRIP NONHPOA (GAUZE/BANDAGES/DRESSINGS)
APPLICATOR COTTON TIP 6IN STRL (MISCELLANEOUS) ×1 IMPLANT
BAG DECANTER FOR FLEXI CONT (MISCELLANEOUS) ×2 IMPLANT
BENZOIN TINCTURE PRP APPL 2/3 (GAUZE/BANDAGES/DRESSINGS) IMPLANT
BLADE CLIPPER SURG (BLADE) ×1 IMPLANT
BRUSH SCRUB EZ PLAIN DRY (MISCELLANEOUS) ×2 IMPLANT
BUR ACRON 5.0MM COATED (BURR) ×2 IMPLANT
BUR MATCHSTICK NEURO 3.0 LAGG (BURR) ×2 IMPLANT
CANISTER SUCT 3000ML PPV (MISCELLANEOUS) ×2 IMPLANT
CAP LCK SPNE (Orthopedic Implant) ×7 IMPLANT
CAP LOCK SPINE RADIUS (Orthopedic Implant) IMPLANT
CAP LOCKING (Orthopedic Implant) ×14 IMPLANT
CONT SPEC 4OZ CLIKSEAL STRL BL (MISCELLANEOUS) ×2 IMPLANT
COVER BACK TABLE 60X90IN (DRAPES) ×2 IMPLANT
CROSSLINK VARIABLE M-A (Orthopedic Implant) ×1 IMPLANT
DERMABOND ADVANCED (GAUZE/BANDAGES/DRESSINGS) ×3
DERMABOND ADVANCED .7 DNX12 (GAUZE/BANDAGES/DRESSINGS) ×2 IMPLANT
DRAPE C-ARM 42X72 X-RAY (DRAPES) ×4 IMPLANT
DRAPE C-ARMOR (DRAPES) ×1 IMPLANT
DRAPE LAPAROTOMY 100X72X124 (DRAPES) ×2 IMPLANT
DRAPE POUCH INSTRU U-SHP 10X18 (DRAPES) ×2 IMPLANT
DRAPE PROXIMA HALF (DRAPES) ×1 IMPLANT
DRSG EMULSION OIL 3X3 NADH (GAUZE/BANDAGES/DRESSINGS) IMPLANT
ELECT REM PT RETURN 9FT ADLT (ELECTROSURGICAL) ×2
ELECTRODE REM PT RTRN 9FT ADLT (ELECTROSURGICAL) ×1 IMPLANT
GAUZE SPONGE 4X4 12PLY STRL (GAUZE/BANDAGES/DRESSINGS) ×2 IMPLANT
GAUZE SPONGE 4X4 16PLY XRAY LF (GAUZE/BANDAGES/DRESSINGS) ×1 IMPLANT
GLOVE BIOGEL PI IND STRL 6.5 (GLOVE) IMPLANT
GLOVE BIOGEL PI IND STRL 8 (GLOVE) ×2 IMPLANT
GLOVE BIOGEL PI INDICATOR 6.5 (GLOVE) ×1
GLOVE BIOGEL PI INDICATOR 8 (GLOVE) ×2
GLOVE ECLIPSE 6.5 STRL STRAW (GLOVE) ×1 IMPLANT
GLOVE ECLIPSE 7.5 STRL STRAW (GLOVE) ×4 IMPLANT
GLOVE EXAM NITRILE LRG STRL (GLOVE) IMPLANT
GLOVE EXAM NITRILE MD LF STRL (GLOVE) IMPLANT
GLOVE EXAM NITRILE XL STR (GLOVE) IMPLANT
GLOVE EXAM NITRILE XS STR PU (GLOVE) IMPLANT
GLOVE SURG SS PI 6.5 STRL IVOR (GLOVE) ×4 IMPLANT
GOWN STRL REUS W/ TWL LRG LVL3 (GOWN DISPOSABLE) IMPLANT
GOWN STRL REUS W/ TWL XL LVL3 (GOWN DISPOSABLE) ×2 IMPLANT
GOWN STRL REUS W/TWL 2XL LVL3 (GOWN DISPOSABLE) IMPLANT
GOWN STRL REUS W/TWL LRG LVL3 (GOWN DISPOSABLE) ×4
GOWN STRL REUS W/TWL XL LVL3 (GOWN DISPOSABLE) ×4
HEMOSTAT POWDER KIT SURGIFOAM (HEMOSTASIS) ×1 IMPLANT
KIT BASIN OR (CUSTOM PROCEDURE TRAY) ×2 IMPLANT
KIT INFUSE MEDIUM (Orthopedic Implant) ×1 IMPLANT
KIT ROOM TURNOVER OR (KITS) ×2 IMPLANT
MARKER SKIN DUAL TIP RULER LAB (MISCELLANEOUS) ×1 IMPLANT
MILL MEDIUM DISP (BLADE) IMPLANT
NDL 18GX1X1/2 (RX/OR ONLY) (NEEDLE) ×1 IMPLANT
NDL SPNL 18GX3.5 QUINCKE PK (NEEDLE) ×1 IMPLANT
NDL SPNL 22GX3.5 QUINCKE BK (NEEDLE) ×1 IMPLANT
NEEDLE 18GX1X1/2 (RX/OR ONLY) (NEEDLE) IMPLANT
NEEDLE BONE MARROW 8GAX6 (NEEDLE) ×1 IMPLANT
NEEDLE SPNL 18GX3.5 QUINCKE PK (NEEDLE) ×4 IMPLANT
NEEDLE SPNL 22GX3.5 QUINCKE BK (NEEDLE) ×2 IMPLANT
NS IRRIG 1000ML POUR BTL (IV SOLUTION) ×5 IMPLANT
PACK LAMINECTOMY NEURO (CUSTOM PROCEDURE TRAY) ×2 IMPLANT
PAD ARMBOARD 7.5X6 YLW CONV (MISCELLANEOUS) ×6 IMPLANT
PATTIES SURGICAL .5 X.5 (GAUZE/BANDAGES/DRESSINGS) IMPLANT
PATTIES SURGICAL .5 X1 (DISPOSABLE) IMPLANT
PATTIES SURGICAL 1X1 (DISPOSABLE) IMPLANT
PEEK PLIF AVS 10X25X4 (Peek) ×2 IMPLANT
PEEK PLIF AVS 9X25X4 (Peek) ×2 IMPLANT
ROD 5.5X60MM GREEN (Rod) ×1 IMPLANT
ROD 70MM (Rod) ×2 IMPLANT
ROD SPNL 70X5.5XNS TI RDS (Rod) IMPLANT
SCREW 5.75X45MM (Screw) ×2 IMPLANT
SCREW 5.75X50MM (Screw) ×2 IMPLANT
SCREW RADIUS 4.0X45MM (Screw) ×2 IMPLANT
SPONGE LAP 4X18 X RAY DECT (DISPOSABLE) ×1 IMPLANT
SPONGE NEURO XRAY DETECT 1X3 (DISPOSABLE) ×1 IMPLANT
SPONGE SURGIFOAM ABS GEL 100 (HEMOSTASIS) ×1 IMPLANT
STAPLER SKIN PROX WIDE 3.9 (STAPLE) IMPLANT
STRIP BIOACTIVE VITOSS 25X100X (Neuro Prosthesis/Implant) ×2 IMPLANT
STRIP CLOSURE SKIN 1/2X4 (GAUZE/BANDAGES/DRESSINGS) IMPLANT
SUT PROLENE 6 0 BV (SUTURE) IMPLANT
SUT VIC AB 1 CT1 18XBRD ANBCTR (SUTURE) ×2 IMPLANT
SUT VIC AB 1 CT1 8-18 (SUTURE) ×8
SUT VIC AB 2-0 CP2 18 (SUTURE) ×5 IMPLANT
SYR 3ML LL SCALE MARK (SYRINGE) ×8 IMPLANT
SYR 5ML LL (SYRINGE) IMPLANT
SYR CONTROL 10ML LL (SYRINGE) ×2 IMPLANT
TAPE CLOTH SURG 4X10 WHT LF (GAUZE/BANDAGES/DRESSINGS) ×1 IMPLANT
TOWEL OR 17X24 6PK STRL BLUE (TOWEL DISPOSABLE) ×2 IMPLANT
TOWEL OR 17X26 10 PK STRL BLUE (TOWEL DISPOSABLE) ×2 IMPLANT
TRAP SPECIMEN MUCOUS 40CC (MISCELLANEOUS) ×1 IMPLANT
TRAY FOLEY CATH 16FRSI W/METER (SET/KITS/TRAYS/PACK) ×1 IMPLANT
TRAY FOLEY W/METER SILVER 14FR (SET/KITS/TRAYS/PACK) ×1 IMPLANT
WATER STERILE IRR 1000ML POUR (IV SOLUTION) ×2 IMPLANT

## 2016-02-27 NOTE — Anesthesia Postprocedure Evaluation (Signed)
Anesthesia Post Note  Patient: Jimmy Rangel  Procedure(s) Performed: Procedure(s) (LRB): Lumbar two-three, Lumbar three-four Lumbar Decompression, Posterior Lumbar Interbody Fusion, Posterior Lateral Arthrodesis (N/A)  Patient location during evaluation: PACU Anesthesia Type: General Level of consciousness: sedated and patient cooperative Pain management: pain level controlled Vital Signs Assessment: post-procedure vital signs reviewed and stable Respiratory status: spontaneous breathing Cardiovascular status: stable Anesthetic complications: no    Last Vitals:  Filed Vitals:   02/27/16 1730 02/27/16 1740  BP: 156/91   Pulse: 103 103  Temp:  36.8 C  Resp: 12 14    Last Pain:  Filed Vitals:   02/27/16 1741  PainSc: [place]

## 2016-02-27 NOTE — Anesthesia Procedure Notes (Signed)
Procedure Name: Intubation Date/Time: 02/27/2016 10:28 AM Performed by: Lance Coon Pre-anesthesia Checklist: Patient identified, Timeout performed, Emergency Drugs available, Suction available and Patient being monitored Patient Re-evaluated:Patient Re-evaluated prior to inductionOxygen Delivery Method: Circle system utilized Preoxygenation: Pre-oxygenation with 100% oxygen Intubation Type: IV induction Ventilation: Oral airway inserted - appropriate to patient size and Two handed mask ventilation required Laryngoscope Size: Miller and 2 Grade View: Grade I Tube type: Oral Tube size: 7.5 mm Number of attempts: 1 Airway Equipment and Method: Stylet Placement Confirmation: ETT inserted through vocal cords under direct vision,  breath sounds checked- equal and bilateral and positive ETCO2 Secured at: 22 cm Tube secured with: Tape Dental Injury: Teeth and Oropharynx as per pre-operative assessment

## 2016-02-27 NOTE — Progress Notes (Signed)
Filed Vitals:   02/27/16 1700 02/27/16 1715 02/27/16 1730 02/27/16 1740  BP: 130/76 136/82 156/91   Pulse: 99 98 103 103  Temp:    98.2 F (36.8 C)  TempSrc:      Resp: 23 19 12 14   Weight:      SpO2: 96% 97% 96% 95%    CBC  Recent Labs  02/25/16 1302  WBC 6.9  HGB 16.7  HCT 50.5  PLT 344   BMET  Recent Labs  02/25/16 1302  NA 135  K 3.9  CL 100*  CO2 26  GLUCOSE 123*  BUN 11  CREATININE 0.96  CALCIUM 9.2    Patient resting in bed, fairly comfortable. Dressing clean and dry. Has only walked from the stretcher to the bed. Foley to straight drainage.   Plan: Encouraged to ambulate with the staff this evening in the halls. We'll DC Foley once up and ambulate actively. We'll continue to progress through postoperative recovery.  Hosie Spangle, MD 02/27/2016, 6:54 PM

## 2016-02-27 NOTE — H&P (Signed)
Subjective: Patient is a 70 y.o. 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  We've treated him for the past year for right lumbar radicular pain secondary to right L2-3 lateral recess and neural foraminal stenosis. 2 weeks ago he developed an acute left lumbar radiculopathy, with weakness of the left iliopsoas, and was found by MRI to have a new left L3-4 lumbar disc herniation. His history is notable for having had a previous L4-S2 lumbar decompression fusion in [place] in 1998. He is admitted now for a L2-L4 decompressive lumbar laminectomy, bilateral L3-4 and L4-L5 facetectomies, and foraminotomies for the L2, L3, and L4 nerve roots bilaterally, along with a 2 level L2-3 and L3-4 posterior lumbar interbody arthrodesis with interbody implants and bone graft, and an L2-L4 posterior lateral arthrodesis with posterior instrumentation and bone graft.    Past Medical History  Diagnosis Date  . Hyperlipidemia     takes Atorvastatin daily  . GERD (gastroesophageal reflux disease)     takes Omeprazole daily  . History of bronchitis as a child   . Headache     rare  . Weakness     left leg  . Arthritis   . Chronic back pain     stenosis    Past Surgical History  Procedure Laterality Date  . Back surgery    . Spleenectomy    . Cholecystectomy    . Colonoscopy    . Esophagogastroduodenoscopy      Prescriptions prior to admission  Medication Sig Dispense Refill Last Dose  . atorvastatin (LIPITOR) 20 MG tablet Take 10 mg by mouth at bedtime.  3 Past Week at Unknown time  . calcium carbonate (OS-CAL - DOSED IN MG OF ELEMENTAL CALCIUM) 1250 (500 Ca) MG tablet Take 1 tablet by mouth daily with breakfast.   02/26/2016 at Unknown time  . diclofenac (VOLTAREN) 75 MG EC tablet Take 75 mg by mouth 2 (two) times daily.   Past Month at Unknown time  . Glucos-Chondroit-Hyaluron-MSM (GLUCOSAMINE CHONDROITIN JOINT PO) Take 2 tablets by  mouth daily.   02/26/2016 at Unknown time  . HYDROcodone-acetaminophen (NORCO/VICODIN) 5-325 MG tablet Take 1 tablet by mouth every 8 (eight) hours as needed for moderate pain.   02/27/2016 at 0600  . omeprazole (PRILOSEC OTC) 20 MG tablet Take 20 mg by mouth every morning.   02/27/2016 at 0600   No Known Allergies  Social History  Substance Use Topics  . Smoking status: Never Smoker   . Smokeless tobacco: Never Used  . Alcohol Use: Yes     Comment: wine nightly    History reviewed. No pertinent family history.   Review of Systems A comprehensive review of systems was negative.  Objective: Vital signs in last 24 hours: Temp:  [98.3 F (36.8 C)] 98.3 F (36.8 C) (05/18 0725) Pulse Rate:  [83] 83 (05/18 0725) Resp:  [18] 18 (05/18 0725) BP: (144)/(92) 144/92 mmHg (05/18 0725) SpO2:  [97 %] 97 % (05/18 0725) Weight:  [87.658 kg (193 lb 4 oz)] 87.658 kg (193 lb 4 oz) (05/18 0725)  EXAM: Patient is a well-developed well-nourished white male in no acute distress. Lungs are clear to auscultation , the patient has symmetrical respiratory excursion. Heart has a regular rate and rhythm normal S1 and S2 no murmur.   Abdomen is soft nontender nondistended bowel sounds are present. Extremity examination shows no clubbing cyanosis or edema. Neurologic examination shows left iliopsoas is 4 minus/5 right iliopsoas  is 5/5. The quadriceps, dorsi flexor, EHL, and plantar flexor are 5/5 bilaterally. Sensation is intact to pinprick the distal lower from his reflexes are absent at the quadriceps and gastrocnemius. They're symmetrical body. Toes are downgoing bilaterally. His gait and stance favor the left lower extremity.  Data Review:CBC    Component Value Date/Time   WBC 6.9 02/25/2016 1302   RBC 5.20 02/25/2016 1302   HGB 16.7 02/25/2016 1302   HCT 50.5 02/25/2016 1302   PLT 344 02/25/2016 1302   MCV 97.1 02/25/2016 1302   MCH 32.1 02/25/2016 1302   MCHC 33.1 02/25/2016 1302   RDW 14.8 02/25/2016  1302                          BMET    Component Value Date/Time   NA 135 02/25/2016 1302   K 3.9 02/25/2016 1302   CL 100* 02/25/2016 1302   CO2 26 02/25/2016 1302   GLUCOSE 123* 02/25/2016 1302   BUN 11 02/25/2016 1302   CREATININE 0.96 02/25/2016 1302   CALCIUM 9.2 02/25/2016 1302   GFRNONAA >60 02/25/2016 1302   GFRAA >60 02/25/2016 1302     Assessment/Plan: Patient with previous L4-S2 decompression and arthrodesis, with advanced degeneration at the L2-3 and L3-4 levels, with an acute left lumbar radiculopathy, with left iliopsoas weakness, and is admitted for lumbar decompression and stabilization.  I've discussed with the patient the nature of his condition, the nature the surgical procedure, the typical length of surgery, hospital stay, and overall recuperation, the limitations postoperatively, and risks of surgery. I discussed risks including risks of infection, bleeding, possibly need for transfusion, the risk of nerve root dysfunction with pain, weakness, numbness, or paresthesias, the risk of dural tear and CSF leakage and possible need for further surgery, the risk of failure of the arthrodesis and possibly for further surgery, the risk of anesthetic complications including myocardial infarction, stroke, pneumonia, and death. We discussed the need for postoperative immobilization in a lumbar brace. Understanding all this the patient does wish to proceed with surgery and is admitted for such.     Hosie Spangle, MD 02/27/2016 10:13 AM

## 2016-02-27 NOTE — Transfer of Care (Signed)
Immediate Anesthesia Transfer of Care Note  Patient: Jimmy Rangel  Procedure(s) Performed: Procedure(s): Lumbar two-three, Lumbar three-four Lumbar Decompression, Posterior Lumbar Interbody Fusion, Posterior Lateral Arthrodesis (N/A)  Patient Location: PACU  Anesthesia Type:General  Level of Consciousness: awake  Airway & Oxygen Therapy: Patient Spontanous Breathing and Patient connected to face mask oxygen  Post-op Assessment: Report given to RN, Post -op Vital signs reviewed and stable and Patient moving all extremities X 4  Post vital signs: Reviewed and stable  Last Vitals:  Filed Vitals:   02/27/16 0725 02/27/16 1641  BP: 144/92 152/91  Pulse: 83 105  Temp: 36.8 C 36.5 C  Resp: 18 16    Last Pain:  Filed Vitals:   02/27/16 1647  PainSc: 5          Complications: No apparent anesthesia complications

## 2016-02-27 NOTE — Anesthesia Preprocedure Evaluation (Addendum)
Anesthesia Evaluation  Patient identified by MRN, date of birth, ID band Patient awake    Reviewed: Allergy & Precautions, H&P , NPO status , Patient's Chart, lab work & pertinent test results  History of Anesthesia Complications Negative for: history of anesthetic complications  Airway Mallampati: IV  TM Distance: <3 FB Neck ROM: full   Comment: He does have a recessed chin and Mallampati view of 4, full neck range of motion and > 6cm thyromental distance Dental no notable dental hx. (+) Dental Advisory Given   Pulmonary neg pulmonary ROS,    Pulmonary exam normal breath sounds clear to auscultation       Cardiovascular negative cardio ROS Normal cardiovascular exam Rhythm:regular Rate:Normal     Neuro/Psych negative neurological ROS     GI/Hepatic Neg liver ROS, GERD  ,  Endo/Other  negative endocrine ROS  Renal/GU negative Renal ROS     Musculoskeletal  (+) Arthritis ,   Abdominal   Peds  Hematology negative hematology ROS (+)   Anesthesia Other Findings   Reproductive/Obstetrics negative OB ROS                            Anesthesia Physical Anesthesia Plan  ASA: II  Anesthesia Plan: General   Post-op Pain Management:    Induction: Intravenous  Airway Management Planned: Oral ETT  Additional Equipment:   Intra-op Plan:   Post-operative Plan:   Informed Consent: I have reviewed the patients History and Physical, chart, labs and discussed the procedure including the risks, benefits and alternatives for the proposed anesthesia with the patient or authorized representative who has indicated his/her understanding and acceptance.   Dental Advisory Given  Plan Discussed with: Anesthesiologist, CRNA and Surgeon  Anesthesia Plan Comments: (Will have glidescope available given recessed chin and mallampati IV grade)       Anesthesia Quick Evaluation

## 2016-02-27 NOTE — Op Note (Signed)
02/27/2016  4:43 PM  PATIENT:  Jimmy Rangel  70 y.o. male  PRE-OPERATIVE DIAGNOSIS:  Multilevel, multifactorial lumbar stenosis, left L3-4 lumbar HNP, lumbar spondylosis, lumbar degenerative disease, lumbar radiculopathy  POST-OPERATIVE DIAGNOSIS:  Multilevel, multifactorial lumbar stenosis, left L3-4 lumbar HNP, lumbar spondylosis, lumbar degenerative disease, lumbar radiculopathy  PROCEDURE:  Procedure(s):  L2, L3, and L4 decompressive lumbar laminectomy, with bilateral L2-3 and L3-4 facetectomies, and foraminotomies for the exiting L2, L3, and L4 nerve roots, with left L3-4 microdiscectomy for free fragment disc herniation, with decompression beyond that required for interbody arthrodesis; bilateral L2-3 and L3-4 posterior lumbar interbody arthrodesis with AVS peek interbody implants, Vitoss BA with bone marrow aspirate, and infuse; bilateral L2-L4 posterior lateral arthrodesis with segmental radius posterior instrumentation, locally harvested morcellized autograft, Vitoss BA with bone marrow aspirate, and infuse  SURGEON:  Surgeon(s): Jovita Gamma, MD Ashok Pall, MD  ASSISTANTS: Ashok Pall, M.D.  ANESTHESIA:   general  EBL:  Total I/O In: 2500 [I.V.:2000; IV Piggyback:500] Out: 850 [Urine:550; Blood:300]  BLOOD ADMINISTERED:none  CELL SAVER GIVEN: Cell Saver technician felt that there was insufficient blood loss to process the collected blood  COUNT: Correct per nursing staff  DICTATION: Patient was brought to the operating room placed under general endotracheal anesthesia. The patient was turned to prone position, the lumbar region was prepped with Betadine soap and solution and draped in a sterile fashion. The midline was infiltrated with local anesthesia with epinephrine. A localizing x-ray was taken and then a midline incision was made through the existing midline incision, and extended rostrally, and carried down through the subcutaneous tissue, bipolar cautery and  electrocautery were used to maintain hemostasis. Dissection was carried down to the lumbar fascia. The fascia was incised bilaterally and the paraspinal muscles were dissected with a spinous process and lamina in a subperiosteal fashion. Another x-ray was taken for localization and the L2-3 and L3-4 levels were localized. Dissection was then carried out laterally over the facet complexes and the transverse processes of L2, L3, and L4 were exposed and decorticated. We did identify the existing superior aspect of the instrumentation from his previous fusion 19 years ago. We did find a Songer cable embedded within the scar tissue, and this was removed. However the rest of the previous fusion instrumentation was left intact.   We then proceeded with the decompression. L2, L3, and L4 decompressive lumbar laminectomies were performed using double-action rongeurs, the high-speed drill, and Kerrison punches. There is marked spondylitic overgrowth, and ligamentum flavum thickening. The thecal sac was carefully decompressed. Decompression was extended laterally, performing bilateral L2-3 and L3-4 facetectomies, and then spondylitic overgrowth causing nerve root stenosis was decompressed, so as to decompress the exiting L2, L3, and L4 nerve roots. We carefully examined the epidural space on the left side at L3-4. The exiting L4 nerve root was identified, gently retracted medially. There was a free fragment disc herniation extending caudally behind the body of L4, that was removed.  Once the decompression of the stenotic compression of the thecal sac and exiting nerve roots was completed we proceeded with the posterior lumbar interbody arthrodesis. The annulus at each level was incised bilaterally and the disc space entered. A thorough discectomy was performed using pituitary rongeurs and curettes. Once the discectomy was completed we began to prepare the endplate surfaces, removing the cartilaginous endplates surface. We  then measured the height of the intervertebral disc space. We selected 9 x 25 x 4 AVS peek interbody implants for the L2-3 level, and  10 x 25 x 4 AVS peek interbody implants for the L3-4 level.  The C-arm fluoroscope was then draped and brought in the field and we identified the pedicle entry points bilaterally at the L2, L3, and L4 levels. Each of the 6 pedicles was probed, we aspirated bone marrow aspirate from the vertebral bodies, this was injected over two 10 cc strips of Vitoss BA. Then each of the pedicles was examined with the ball probe, good bony surfaces were found and no bony cuts were found. The L2 pedicles were tapped with a 3.50 mm tap, again examined with the ball probe, good threading was found, no bony cuts were found, we placed 4.00 x 45 mm screws bilaterally at L2. Each of the L3 and L4 pedicles was then tapped with a 5.25 mm tap, again examined with the ball probe good threading was found and no bony cuts were found. We then placed 5.75 by 45 millimeter screws bilaterally at the L3 level and 5.75 by 50 millimeter screws bilaterally at the L4 level.  We then packed the AVS peek interbody implants with Vitoss BA with bone marrow aspirate and infuse, and then placed the first implant at the L2-3 level on the right side, carefully retracting the thecal sac and nerve root medially. We then went back to the left side and packed the midline with additional Vitoss BA with bone marrow aspirate and infuse, and then placed a second implant on the left side again retracting the thecal sac and nerve root medially. Additional Vitoss BA with bone marrow aspirate was packed lateral to the right sided implant.  Then at the L3-4 level, we placed the first implant on the right side, carefully retracting the thecal sac and nerve root medially. We then went back to the left side and placed a second implant on the left side, again retracting the thecal sac and nerve root medially.  We then packed the lateral  gutter over the transverse processes and intertransverse space with locally harvested morcellized autograft, Vitoss BA with bone marrow aspirate, and infuse. We then selected pre-lordosed rods, they were placed within the screw heads and secured with locking caps once all 6 locking caps were placed final tightening was performed against a counter torque.  We used a 70 mm rod on the left, and a 60 mm rod on the right. A variable multi angle cross connector was secured to the rods, between the L3 and L4 screws bilaterally. It was locked down to each of the rods, and then both of the central locking collars were tightened down.  The wound had been irrigated multiple times during the procedure with saline solution and bacitracin solution, good hemostasis was established with a combination of bipolar cautery and Gelfoam with thrombin. The Gelfoam was removed, and a thin layer of Surgifoam applied. Once good hemostasis was confirmed we proceeded with closure paraspinal muscles deep fascia and Scarpa's fascia were closed with interrupted undyed 1 Vicryl sutures the subcutaneous and subcuticular closed with interrupted inverted 2-0 undyed Vicryl sutures the skin edges were approximated with Dermabond. The wound was stressed with sterile gauze and Hypafix.  Following surgery the patient was turned back to the supine position to be reversed and the anesthetic extubated and transferred to the recovery room for further care.    PLAN OF CARE: Admit for overnight observation  PATIENT DISPOSITION:  PACU - hemodynamically stable.   Delay start of Pharmacological VTE agent (>24hrs) due to surgical blood loss or risk of bleeding:  yes

## 2016-02-28 DIAGNOSIS — M5116 Intervertebral disc disorders with radiculopathy, lumbar region: Secondary | ICD-10-CM | POA: Diagnosis not present

## 2016-02-28 DIAGNOSIS — M545 Low back pain: Secondary | ICD-10-CM | POA: Diagnosis present

## 2016-02-28 DIAGNOSIS — Z981 Arthrodesis status: Secondary | ICD-10-CM | POA: Diagnosis not present

## 2016-02-28 DIAGNOSIS — M4726 Other spondylosis with radiculopathy, lumbar region: Secondary | ICD-10-CM | POA: Diagnosis not present

## 2016-02-28 DIAGNOSIS — M4806 Spinal stenosis, lumbar region: Secondary | ICD-10-CM | POA: Diagnosis not present

## 2016-02-28 DIAGNOSIS — K219 Gastro-esophageal reflux disease without esophagitis: Secondary | ICD-10-CM | POA: Diagnosis present

## 2016-02-28 DIAGNOSIS — Z01812 Encounter for preprocedural laboratory examination: Secondary | ICD-10-CM | POA: Diagnosis not present

## 2016-02-28 DIAGNOSIS — Z0183 Encounter for blood typing: Secondary | ICD-10-CM | POA: Diagnosis not present

## 2016-02-28 DIAGNOSIS — E785 Hyperlipidemia, unspecified: Secondary | ICD-10-CM | POA: Diagnosis not present

## 2016-02-28 MED FILL — Sodium Chloride IV Soln 0.9%: INTRAVENOUS | Qty: 1000 | Status: AC

## 2016-02-28 MED FILL — Heparin Sodium (Porcine) Inj 1000 Unit/ML: INTRAMUSCULAR | Qty: 30 | Status: AC

## 2016-02-28 NOTE — Progress Notes (Signed)
Filed Vitals:   02/27/16 1740 02/27/16 2030 02/27/16 2324 02/28/16 0400  BP:  152/84 153/89 147/85  Pulse: 103 112 102 95  Temp: 98.2 F (36.8 C) 98.5 F (36.9 C) 98.6 F (37 C) 98.3 F (36.8 C)  TempSrc:  Oral Oral Oral  Resp: 14 18 18 20   Weight:      SpO2: 95% 94% 95% 99%    CBC  Recent Labs  02/25/16 1302  WBC 6.9  HGB 16.7  HCT 50.5  PLT 344   BMET  Recent Labs  02/25/16 1302  NA 135  K 3.9  CL 100*  CO2 26  GLUCOSE 123*  BUN 11  CREATININE 0.96  CALCIUM 9.2    Patient resting in bed, has ambulated with the staff in the halls. Dressing clean and dry. Patient having some mild weeping from his chin, due to pressure breakdown. A dressing of Adaptic, Telfa, and Hypafix applied. We'll change daily. Foley DC'd about an hour ago, nursing staff to monitor voiding function. Encouraged to ambulate at least 5 times up and down the halls today.  Plan: Continue to progress through postoperative recovery.  Hosie Spangle, MD 02/28/2016, 7:42 AM

## 2016-02-28 NOTE — Progress Notes (Signed)
Pt adm to unit as a transfer from [facility]. Pt A&O x4; arrived to the unit via wheelchair with spouse and belongings to the side. Pt incision dsg remains clean, dry and intact. VSS; pt oriented to the unit and room; fall/safety precaution and prevention education completed with pt and spouse. Pt sitting up in chair with call within reach. Will continue to monitor. Delia Heady RN

## 2016-02-28 NOTE — Progress Notes (Signed)
Pt. seen for CPAP order-refused prior this shift, RN aware, RT to monitor.

## 2016-02-28 NOTE — Progress Notes (Signed)
Utilization review completed.  

## 2016-02-29 MED ORDER — CYCLOBENZAPRINE HCL 10 MG PO TABS: 5.0000 mg | ORAL_TABLET | Freq: Three times a day (TID) | ORAL | Status: DC | PRN

## 2016-02-29 MED ORDER — HYDROCODONE-ACETAMINOPHEN 5-325 MG PO TABS: 1.0000 | ORAL_TABLET | ORAL | Status: DC | PRN

## 2016-02-29 NOTE — Progress Notes (Signed)
Discharge instructions reviewed with patient and spouse. No further questions at this time. Patient is transported to family vehicle.

## 2016-02-29 NOTE — Progress Notes (Signed)
Patient in room with wife, denies pain/discomfort, states that he is ready to go home-he said that MD came in and told him that he is going home today. Will continue to monitor.

## 2016-02-29 NOTE — Discharge Summary (Signed)
Physician Discharge Summary  Patient ID: Jimmy Rangel MRN: [id number] DOB/AGE: [birth year] 70 y.o.  Admit date: 02/27/2016 Discharge date: 02/29/2016  Admission Diagnoses:  Multilevel, multifactorial lumbar stenosis, left L3-4 lumbar HNP, lumbar spondylosis, lumbar degenerative disease, lumbar radiculopathy  Discharge Diagnoses:  Multilevel, multifactorial lumbar stenosis, left L3-4 lumbar HNP, lumbar spondylosis, lumbar degenerative disease, lumbar radiculopathy Active Problems:   Lumbar stenosis with neurogenic claudication   Discharged Condition: good  Hospital Course: Patient was admitted, underwent an L2-L4 decompressive lumbar laminectomy, L2-3 and L3-4 PLIF and PLA. Postoperatively he has done well. He is up and ambulate actively in the halls. His dressing was removed, and his incision is healing nicely. There is no erythema, swelling, drainage, or ecchymosis.  He and his wife have been given instructions regarding wound care and activities following discharge. He is already scheduled to follow-up with me in 3 weeks, with x-rays at my office.  Discharge Exam: Blood pressure 129/76, pulse 92, temperature 98 F (36.7 C), temperature source Oral, resp. rate 18, weight 87.658 kg (193 lb 4 oz), SpO2 96 %.  Disposition:  Home    Medication List    TAKE these medications        atorvastatin 20 MG tablet  Commonly known as:  LIPITOR  Take 10 mg by mouth at bedtime.     calcium carbonate 1250 (500 Ca) MG tablet  Commonly known as:  OS-CAL - dosed in mg of elemental calcium  Take 1 tablet by mouth daily with breakfast.     cyclobenzaprine 10 MG tablet  Commonly known as:  FLEXERIL  Take 0.5-1 tablets (5-10 mg total) by mouth 3 (three) times daily as needed for muscle spasms.     diclofenac 75 MG EC tablet  Commonly known as:  VOLTAREN  Take 75 mg by mouth 2 (two) times daily.     GLUCOSAMINE CHONDROITIN JOINT PO  Take 2 tablets by mouth daily.     HYDROcodone-acetaminophen  5-325 MG tablet  Commonly known as:  NORCO/VICODIN  Take 1 tablet by mouth every 8 (eight) hours as needed for moderate pain.     HYDROcodone-acetaminophen 5-325 MG tablet  Commonly known as:  NORCO/VICODIN  Take 1-2 tablets by mouth every 4 (four) hours as needed (mild pain).     omeprazole 20 MG tablet  Commonly known as:  PRILOSEC OTC  Take 20 mg by mouth every morning.         SignedHosie Spangle 02/29/2016, 7:53 AM

## 2016-03-08 ENCOUNTER — Emergency Department (HOSPITAL_COMMUNITY)
Admission: EM | Admit: 2016-03-08 | Discharge: 2016-03-08 | Disposition: A | Discharge status: Home / Self Care | Payer: Medicare Other | Attending: Emergency Medicine | Admitting: Emergency Medicine

## 2016-03-08 ENCOUNTER — Encounter (HOSPITAL_COMMUNITY): Payer: Self-pay

## 2016-03-08 ENCOUNTER — Emergency Department (HOSPITAL_COMMUNITY): Payer: Medicare Other

## 2016-03-08 DIAGNOSIS — R350 Frequency of micturition: Secondary | ICD-10-CM | POA: Diagnosis present

## 2016-03-08 DIAGNOSIS — E785 Hyperlipidemia, unspecified: Secondary | ICD-10-CM | POA: Diagnosis not present

## 2016-03-08 DIAGNOSIS — R339 Retention of urine, unspecified: Secondary | ICD-10-CM

## 2016-03-08 DIAGNOSIS — Z79899 Other long term (current) drug therapy: Secondary | ICD-10-CM | POA: Diagnosis not present

## 2016-03-08 DIAGNOSIS — Z791 Long term (current) use of non-steroidal anti-inflammatories (NSAID): Secondary | ICD-10-CM | POA: Diagnosis not present

## 2016-03-08 LAB — URINALYSIS, ROUTINE W REFLEX MICROSCOPIC
Bilirubin Urine: NEGATIVE
Glucose, UA: NEGATIVE mg/dL
Hgb urine dipstick: NEGATIVE
Ketones, ur: NEGATIVE mg/dL
Leukocytes, UA: NEGATIVE
Nitrite: NEGATIVE
Protein, ur: NEGATIVE mg/dL
Specific Gravity, Urine: 1.018 (ref 1.005–1.030)
pH: 7.5 (ref 5.0–8.0)

## 2016-03-08 NOTE — ED Notes (Signed)
Patient transported to X-ray 

## 2016-03-08 NOTE — ED Notes (Signed)
Patient here with complaint of back spasms and decreased urination and constipation for past 10 days since back surgery. Reports frequent urination with minimal output.

## 2016-03-08 NOTE — ED Provider Notes (Signed)
CSN: [id number]     Arrival date & time 03/08/16  1237 History   First MD Initiated Contact with Patient 03/08/16 1306     Chief Complaint  Patient presents with  . possible uti   . Back Pain   (Consider location/radiation/quality/duration/timing/severity/associated sxs/prior Treatment) HPI 70 y.o. male with a hx of Lumbar Laminectomy, presents to the Emergency Department today complaining of constipation and increase in urinary frequency with minimal output. Pt had recent admission for L2-L4 Lumbar Laminectomy on 5/18 with Dr. Sherwood Gambler. DCed on [id number] with no complications. Presents today due to increased urinary frequency as well as burning with urination. Pt states that he called Dr. Sherwood Gambler who suspects UTI due to Foley use in the hospital. Pt last BM was Wednesday with no diarrhea or hematochezia. Has tried [facility] and MOM for the past few days with minimal output. States that he is passing flatus. Able to tolerate PO. No fevers. No loss of bowel or bladder function. No saddle anesthesia. No other symptoms noted.    Past Medical History  Diagnosis Date  . Hyperlipidemia     takes Atorvastatin daily  . GERD (gastroesophageal reflux disease)     takes Omeprazole daily  . History of bronchitis as a child   . Headache     rare  . Weakness     left leg  . Arthritis   . Chronic back pain     stenosis   Past Surgical History  Procedure Laterality Date  . Back surgery    . Spleenectomy    . Cholecystectomy    . Colonoscopy    . Esophagogastroduodenoscopy     No family history on file. Social History  Substance Use Topics  . Smoking status: Never Smoker   . Smokeless tobacco: Never Used  . Alcohol Use: Yes     Comment: wine nightly    Review of Systems ROS reviewed and all are negative for acute change except as noted in the HPI.  Allergies  Review of patient's allergies indicates no known allergies.  Home Medications   Prior to Admission medications   Medication  Sig Start Date End Date Taking? Authorizing Provider  atorvastatin (LIPITOR) 20 MG tablet Take 10 mg by mouth at bedtime. 01/11/16   Historical Provider, MD  calcium carbonate (OS-CAL - DOSED IN MG OF ELEMENTAL CALCIUM) 1250 (500 Ca) MG tablet Take 1 tablet by mouth daily with breakfast.    Historical Provider, MD  cyclobenzaprine (FLEXERIL) 10 MG tablet Take 0.5-1 tablets (5-10 mg total) by mouth 3 (three) times daily as needed for muscle spasms. 02/29/16   Jovita Gamma, MD  diclofenac (VOLTAREN) 75 MG EC tablet Take 75 mg by mouth 2 (two) times daily.    Historical Provider, MD  Glucos-Chondroit-Hyaluron-MSM (GLUCOSAMINE CHONDROITIN JOINT PO) Take 2 tablets by mouth daily.    Historical Provider, MD  HYDROcodone-acetaminophen (NORCO/VICODIN) 5-325 MG tablet Take 1 tablet by mouth every 8 (eight) hours as needed for moderate pain.    Historical Provider, MD  HYDROcodone-acetaminophen (NORCO/VICODIN) 5-325 MG tablet Take 1-2 tablets by mouth every 4 (four) hours as needed (mild pain). 02/29/16   Jovita Gamma, MD  omeprazole (PRILOSEC OTC) 20 MG tablet Take 20 mg by mouth every morning.    Historical Provider, MD   BP 131/80 mmHg  Pulse 103  Temp(Src) 98.2 F (36.8 C) (Oral)  Resp 18  SpO2 95%   Physical Exam  Constitutional: He is oriented to person, place, and time. He appears well-developed and  well-nourished.  HENT:  Head: Normocephalic and atraumatic.  Eyes: EOM are normal. Pupils are equal, round, and reactive to light.  Neck: Normal range of motion.  Cardiovascular: Normal rate, regular rhythm and normal heart sounds.   Pulmonary/Chest: Effort normal and breath sounds normal.  Abdominal: Soft. Bowel sounds are normal. There is no tenderness.  Musculoskeletal: Normal range of motion.       Lumbar back: He exhibits normal range of motion, no tenderness, no bony tenderness, no swelling, no edema, no deformity, no pain, no spasm and normal pulse.  Pt able to ambulate without  difficulty. Surgical incision noted. Well healed. No signs of infection. No erythema.   Neurological: He is alert and oriented to person, place, and time. He has normal strength and normal reflexes. No cranial nerve deficit or sensory deficit.  Skin: Skin is warm and dry.  Psychiatric: He has a normal mood and affect. His behavior is normal. Thought content normal.  Nursing note and vitals reviewed.  ED Course  Procedures (including critical care time) Labs Review Labs Reviewed  URINALYSIS, ROUTINE W REFLEX MICROSCOPIC (NOT AT [facility])   Imaging Review Dg Abd 1 View  03/08/2016  CLINICAL DATA:  Constipation.  Evaluate for small bowel obstruction. EXAM: ABDOMEN - 1 VIEW COMPARISON:  None. FINDINGS: Two views of the abdomen and pelvis. Cholecystectomy. Lumbosacral spine fixation. No free intraperitoneal air. Large colonic stool burden. Colonic prominence throughout. No small bowel distension. Distal gas in stool. IMPRESSION: Possible constipation. No evidence of bowel obstruction or free intraperitoneal air. Electronically Signed   By: Abigail Miyamoto M.D.   On: 03/08/2016 15:08   I have personally reviewed and evaluated these images and lab results as part of my medical decision-making.   EKG Interpretation None      MDM  I have reviewed and evaluated the relevant laboratory values. I have reviewed and evaluated the relevant imaging studies.  I have reviewed the relevant previous healthcare records. I obtained HPI from historian. Patient discussed with supervising physician  ED Course:  Assessment: Pt is a 70yM with hx L2-L4 Lumbar Laminectomy on 5/18 presents with constipation x 4-5 days as well as increase in urinary output with dysuria. Surgeon suspects UTI. Sent to ED for evaluation. On exam, pt in NAD. Nontoxic/nonseptic appearing. VSS. Afebrile. Lungs CTA. Heart RRR. Abdomen nontender/soft. Bladder Scan showed retention of 997. Foley placed. UA unremarkable. KUB unremarkable. Plan is to  DC home with follow up to Urology for acute urinary retention. At time of discharge, Patient is in no acute distress. Vital Signs are stable. Patient is able to ambulate. Patient able to tolerate PO.  Discussed patient presentation with Dr. Sherwood Gambler and recommended no further imaging. Does not feel that this is neurological given exam. Follow up with Urology with foley.       Disposition/Plan:  DC Home Additional Verbal discharge instructions given and discussed with patient.  Pt Instructed to f/u with Urology in the next week for evaluation and treatment of symptoms. Return precautions given Pt acknowledges and agrees with plan  Supervising Physician Lajean Saver, MD   Final diagnoses:  Urinary retention      Shary Decamp, PA-C 03/08/16 [place], MD 03/10/16 1233

## 2016-03-08 NOTE — Discharge Instructions (Signed)
Please read and follow all provided instructions.  Your diagnoses today include:  1. Urinary retention    Tests performed today include:  Vital signs. See below for your results today.   Medications prescribed:   Take as prescribed   Home care instructions:  Follow any educational materials contained in this packet.  Follow-up instructions: Please follow-up with Urology for further evaluation of symptoms and treatment   Return instructions:   Please return to the Emergency Department if you do not get better, if you get worse, or new symptoms OR  - Fever (temperature greater than 101.38F)  - Bleeding that does not stop with holding pressure to the area    -Severe pain (please note that you may be more sore the day after your accident)  - Chest Pain  - Difficulty breathing  - Severe nausea or vomiting  - Inability to tolerate food and liquids  - Passing out  - Skin becoming red around your wounds  - Change in mental status (confusion or lethargy)  - New numbness or weakness     Please return if you have any other emergent concerns.  Additional Information:  Your vital signs today were: BP 131/80 mmHg   Pulse 103   Temp(Src) 98.2 F (36.8 C) (Oral)   Resp 18   SpO2 95% If your blood pressure (BP) was elevated above 135/85 this visit, please have this repeated by your doctor within one month. ---------------

## 2016-03-08 NOTE — ED Notes (Signed)
Has been taking ducosate OTC and MOM for past few days-- no bowel movements since Wednesday-- is passing gas,  C/o urinary frequency, with oliguria at time of urination.   Pt's family talked with dr. Sherwood Gambler -- sent here to be checked for UTI.

## 2016-03-08 NOTE — ED Notes (Addendum)
Instructions on catheter given.

## 2016-03-08 NOTE — ED Notes (Signed)
Bladder Scan Done On PT - Holding 927ml. PT unable to provide UA at this time

## 2016-05-26 ENCOUNTER — Ambulatory Visit: Payer: Medicare Other | Attending: Neurosurgery | Admitting: Physical Therapy

## 2016-05-26 DIAGNOSIS — M6281 Muscle weakness (generalized): Secondary | ICD-10-CM | POA: Insufficient documentation

## 2016-05-26 DIAGNOSIS — M545 Low back pain, unspecified: Secondary | ICD-10-CM

## 2016-05-26 NOTE — Patient Instructions (Signed)
    Hamstring Step 1   Straighten left knee. Keep knee level with other knee or on bolster. Hold __30_ seconds. Relax knee by returning foot to start. Repeat __3_ times.    Ruben Im PT [facility] [address] [place], [place] [address] Phone # [phone] Fax [phone]

## 2016-05-26 NOTE — Therapy (Signed)
[facility] [address], [place], [place], [address] Phone: [phone]   Fax:  [phone]  Physical Therapy Evaluation  Patient Details  Name: Jimmy Rangel MRN: [id number] Date of Birth: 06/27/1946 Referring Provider: Dr. Sherwood Gambler  Encounter Date: 05/26/2016      PT End of Session - 05/26/16 1527    Visit Number 1   Number of Visits 10   Date for PT Re-Evaluation 07/21/16   Authorization Type UHC Medicare;  G codes;  KX at visit 15   PT Start Time 1445   PT Stop Time 1530   PT Time Calculation (min) 45 min   Activity Tolerance Patient tolerated treatment well      Past Medical History:  Diagnosis Date  . Arthritis   . Chronic back pain    stenosis  . GERD (gastroesophageal reflux disease)    takes Omeprazole daily  . Headache    rare  . History of bronchitis as a child   . Hyperlipidemia    takes Atorvastatin daily  . Weakness    left leg    Past Surgical History:  Procedure Laterality Date  . BACK SURGERY    . CHOLECYSTECTOMY    . COLONOSCOPY    . ESOPHAGOGASTRODUODENOSCOPY    . spleenectomy      There were no vitals filed for this visit.       Subjective Assessment - 05/26/16 1452    Subjective Further degeneration of spine over the last 3 years and worsened.  Was working at [employer] and was doing a lot of awkward lifting and twisting.  Bilateral low back. Lumbar fusion L2,3,4. with hardware attached to L4-5 02/27/16.  Only in hospital 2 days.  Went home with brace.  Now weaning off to 4 hours/day brace.     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 Walks puppy 10x/day uphill;  thinks he could walk a mile if flat   Diagnostic tests prior to surgery   Patient Stated Goals be as healthy as possible;  play golf;  mow grass (riding mower)     Currently in Pain? Yes   Pain Score 0-No pain   Pain Location Back   Pain Orientation Lower;Right;Left   Aggravating Factors  lifting a case of water into cart or back of car or off the floor frightens me.     Pain Relieving Factors Advil            [facility] PT Assessment - 05/26/16 0001      Assessment   Medical Diagnosis lumbar fusion L2-3-4   Referring Provider Dr. Sherwood Gambler   Onset Date/Surgical Date 02/27/16   Hand Dominance Right   Next MD Visit November   Prior Therapy Had PT prior to surgery "it hurt"       Precautions   Precautions --  lifting 15-20#;  wean off brace in month of Aug     Restrictions   Weight Bearing Restrictions No     Balance Screen   Has the patient fallen in the past 6 months No   Has the patient had a decrease in activity level because of a fear of falling?  No   Is the patient reluctant to leave their home because of a fear of falling?  No     Home Social worker Private residence   Living  Arrangements Spouse/significant other   Home Access Stairs to enter   Home Layout Two level     Prior Function   Level of Independence Independent   Vocation Retired   Surveyor, minerals; work in yard     Observation/Other Assessments   Focus on Therapeutic Outcomes (FOTO)  52% limitation     Posture/Postural Control   Posture/Postural Control Postural limitations   Postural Limitations Decreased lumbar lordosis     AROM   Overall AROM Comments Formal lumbar AROM not assessed secondary to recent fusion precautions   AROM Assessment Site Lumbar     Strength   Strength Assessment Site --  difficulty sustaining a bridge   Right/Left Hip Right;Left   Right Hip Extension 4-/5   Right Hip ABduction 4-/5   Left Hip Extension 4-/5   Left Hip ABduction 4-/5   Lumbar Flexion 3+/5   Lumbar Extension 3+/5     Flexibility   Soft Tissue Assessment /Muscle Length yes   Hamstrings 40 degrees bilaterally   Quadriceps hip flexors 5 degrees     Special Tests   Lumbar Tests FABER  test;Slump Test;Straight Leg Raise     FABER test   findings Negative     Slump test   Findings Negative     Straight Leg Raise   Findings Negative                           PT Education - 05/26/16 1526    Education provided Yes   Education Details abdominal brace series;  HS stretch   Person(s) Educated Patient   Methods Explanation;Demonstration;Handout   Comprehension Verbalized understanding;Returned demonstration          PT Short Term Goals - 05/26/16 2015      PT SHORT TERM GOAL #1   Title The patient will demonstrate basic self care strategies to promote further surgical healing   06/23/16   Time 4   Period Weeks   Status New     PT SHORT TERM GOAL #2   Title The patient will have improved HS and hip flexor muscle lengths to 60 and 10 degrees needed for return to light yardwork tasks   Time 4   Period Weeks   Status New     PT SHORT TERM GOAL #3   Title Patient will have improved hip mobility and strength to get up and down off the floor using good body mechanics   Time 4   Period Weeks           PT Long Term Goals - 05/26/16 2019      PT LONG TERM GOAL #1   Title The patient will be independent in safe self progression of HEP needed for further improvements in mobility and function  07/21/16   Time 8   Period Weeks   Status New     PT LONG TERM GOAL #2   Title Overall pain and discomfort will be < 3/10 with light yard work    Time 8   Period Weeks   Status New     PT LONG TERM GOAL #3   Title Patient will have improved core and hip strength needed to lift 10-15 # from floor to chest with proper body mechanics   Time 8   Period Weeks     PT LONG TERM GOAL #4   Title Patient will have spinal ROM adequate for future return to golf  Time 8   Period Weeks   Status New     PT LONG TERM GOAL #5   Title Patient will report overall pain and function improved by > 70%   Time 8   Period Weeks   Status New     Additional  Long Term Goals   Additional Long Term Goals Yes     PT LONG TERM GOAL #6   Title FOTO functional outcome score improved from 52% to 43% indicating improved function with less pain               Plan - 05/26/16 1957    Clinical Impression Statement The patient is a 70 year old male who underwent multi-level lumbar fusion (L2,3.4) on 02/27/16.  It should also be noted that he has had L4-5 fusion in the past.  He has a lumbar brace and reports he has weaned down to 4 hours/day of wear time and was told to wean completely off by the end of the month.  Formal lumbar AROM not tested secondary to surgical precautions (multi level fusion).  Decreased HS and hip flexor muscle lengths bilaterally.  Decreased core/trunk strength 3+/5 and hip abduction and extension strength 4-/5.  He is unable to do yard work including mowing, playing golf and lifting.   He would benefit from PT to address these deficits.  Due to his lack of co-morbidities and his supportive home environment this patient is of low evaluation complexity.       Rehab Potential Good   PT Frequency 2x / week   PT Duration 8 weeks   PT Treatment/Interventions ADLs/Self Care Home Management;Cryotherapy;Electrical Stimulation;Moist Heat;Patient/family education;Therapeutic exercise;Therapeutic activities;Manual techniques;Taping   PT Next Visit Plan Review HS stretch and abdominal brace;  add standing hip flexor stretch;  add bridge and quadruped bird dogs;  Nu-Step      Patient will benefit from skilled therapeutic intervention in order to improve the following deficits and impairments:  Decreased range of motion, Decreased strength, Decreased mobility, Pain, Impaired flexibility  Visit Diagnosis: Bilateral low back pain without sciatica - Plan: PT plan of care cert/re-cert  Muscle weakness (generalized) - Plan: PT plan of care cert/re-cert      G-Codes - 123456 01-16-25    Functional Assessment Tool Used FOTO; clinical judgement     Functional Limitation Mobility: Walking and moving around   Mobility: Walking and Moving Around Current Status [phone]) At least 40 percent but less than 60 percent impaired, limited or restricted   Mobility: Walking and Moving Around Goal Status [phone]) At least 20 percent but less than 40 percent impaired, limited or restricted       Problem List Patient Active Problem List   Diagnosis Date Noted  . Lumbar stenosis with neurogenic claudication 02/27/2016   Ruben Im, PT 05/26/16 8:29 PM Phone: [phone] Fax: [phone]  Alvera Singh 05/26/2016, 8:28 PM  [facility] [address], [place], [place], [address] Phone: [phone]   Fax:  [phone]  Name: Jimmy Rangel MRN: [id number] Date of Birth: 03/26/1946

## 2016-05-27 ENCOUNTER — Encounter: Payer: Self-pay | Admitting: Physical Therapy

## 2016-05-27 ENCOUNTER — Ambulatory Visit: Payer: Medicare Other | Admitting: Physical Therapy

## 2016-05-27 DIAGNOSIS — M6281 Muscle weakness (generalized): Secondary | ICD-10-CM

## 2016-05-27 DIAGNOSIS — M545 Low back pain, unspecified: Secondary | ICD-10-CM

## 2016-05-27 NOTE — Patient Instructions (Addendum)
Chair Sitting    Sit at edge of seat, spine straight, one leg extended. Put a hand on each thigh and bend forward from the hip, keeping spine straight. Allow hand on extended leg to reach toward toes. Support upper body with other arm. Hold _30__ seconds. Repeat _2__ times per session. Do _1__ sessions per day.  Copyright  VHI. All rights reserved.  Piriformis Stretch, Sitting    Sit, one ankle on opposite knee, same-side hand on crossed knee. Push down on knee, keeping spine straight. Lean torso forward, with flat back, until tension is felt in hamstrings and gluteals of crossed-leg side. Hold _30__ seconds.  Repeat _2__ times per session. Do _1__ sessions per day.  Copyright  VHI. All rights reserved.   Quads / HF, Lunge    Step into deep forward lunge, hands on forward thigh, behind knee lightly touching floor. Push touching knee straight. Do not allow front knee past line of toes. Hold _15__ seconds. Repeat _2__ times per session. Do __1_ sessions per day. Can use a chair for balance Copyright  VHI. All rights reserved.  Bridging    Slowly raise buttocks from floor, keeping stomach tight. Repeat __10__ times per set. Do ___1_ sets per session. Do __1__ sessions per day.  http://orth.exer.us/1096   Copyright  VHI. All rights reserved.  Bracing With Arms / Legs (Hook-Lying)    With neutral spine, tighten pelvic floor and abdominals and hold. Raise arm and opposite leg, then return. Repeat wtih other limbs. Repeat _15__ times. Do _1__ times a day.   Copyright  VHI. All rights reserved.  [facility] [address] [place], [place] [address] Phone # [phone] Fax [phone]

## 2016-05-27 NOTE — Therapy (Signed)
[facility] [address], [place], [place], [address] Phone: [phone]   Fax:  [phone]  Physical Therapy Treatment  Patient Details  Name: Jimmy Rangel MRN: [id number] Date of Birth: 1946-06-03 Referring Provider: Dr. Sherwood Gambler  Encounter Date: 05/27/2016      PT End of Session - 05/27/16 1519    Visit Number 2   Number of Visits 10   Date for PT Re-Evaluation 07/21/16   Authorization Type UHC Medicare;  G codes;  KX at visit 15   PT Start Time 1445   PT Stop Time 1528   PT Time Calculation (min) 43 min   Activity Tolerance Patient tolerated treatment well   Behavior During Therapy [facility] for tasks assessed/performed      Past Medical History:  Diagnosis Date  . Arthritis   . Chronic back pain    stenosis  . GERD (gastroesophageal reflux disease)    takes Omeprazole daily  . Headache    rare  . History of bronchitis as a child   . Hyperlipidemia    takes Atorvastatin daily  . Weakness    left leg    Past Surgical History:  Procedure Laterality Date  . BACK SURGERY    . CHOLECYSTECTOMY    . COLONOSCOPY    . ESOPHAGOGASTRODUODENOSCOPY    . spleenectomy      There were no vitals filed for this visit.      Subjective Assessment - 05/27/16 1450    Subjective I felt good after the eval.  I had a little soreness and walked it off.    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 Walks puppy 10x/day uphill;  thinks he could walk a mile if flat   Diagnostic tests prior to surgery   Patient Stated Goals be as healthy as possible;  play golf;  mow grass (riding mower)     Currently in Pain? No/denies                         [facility] Adult PT Treatment/Exercise - 05/27/16 0001      Lumbar Exercises: Aerobic   Stationary Bike nustep level 1 arm #11, seat #11     Lumbar Exercises: Supine   Ab Set 15 reps;5 seconds  tactile cues to contract abdominals   Dead Bug 15 reps   Bridge 5 seconds;15 reps   Other Supine Lumbar Exercises hookly move yellow plyoball up and down in mid range 20x 2 with abdominal bracing, , then go side to side 10x2; then diagonals 10x2;                 PT Education - 05/27/16 1522    Education provided Yes   Education Details stretches and core strength   Person(s) Educated Patient   Methods Explanation;Demonstration;Verbal cues;Handout   Comprehension Returned demonstration;Verbalized understanding          PT Short Term Goals - 05/26/16 2015      PT SHORT TERM GOAL #1   Title The patient will demonstrate basic self care strategies to promote further surgical healing   06/23/16   Time 4   Period Weeks   Status New     PT SHORT TERM GOAL #2   Title The patient will have improved HS and hip flexor muscle lengths to 60 and 10 degrees  needed for return to light yardwork tasks   Time 4   Period Weeks   Status New     PT SHORT TERM GOAL #3   Title Patient will have improved hip mobility and strength to get up and down off the floor using good body mechanics   Time 4   Period Weeks           PT Long Term Goals - 05-27-16 01/11/2018      PT LONG TERM GOAL #1   Title The patient will be independent in safe self progression of HEP needed for further improvements in mobility and function  07/21/16   Time 8   Period Weeks   Status New     PT LONG TERM GOAL #2   Title Overall pain and discomfort will be < 3/10 with light yard work    Time 8   Period Weeks   Status New     PT LONG TERM GOAL #3   Title Patient will have improved core and hip strength needed to lift 10-15 # from floor to chest with proper body mechanics   Time 8   Period Weeks     PT LONG TERM GOAL #4   Title Patient will have spinal ROM adequate for future return to golf   Time 8   Period Weeks   Status New     PT LONG TERM GOAL #5   Title Patient will report  overall pain and function improved by > 70%   Time 8   Period Weeks   Status New     Additional Long Term Goals   Additional Long Term Goals Yes     PT LONG TERM GOAL #6   Title FOTO functional outcome score improved from 52% to 43% indicating improved function with less pain               Plan - 05/27/16 1519    Clinical Impression Statement Patient has no pain after exercise.  Patient has learned HEP for stretches and back stabilization exercises.  Patient is working on [employer].  Patient will benefit from skilled PT to address weakness and deficits in mobility.    Rehab Potential Good   Clinical Impairments Affecting Rehab Potential None   PT Frequency 2x / week   PT Duration 8 weeks   PT Treatment/Interventions ADLs/Self Care Home Management;Cryotherapy;Electrical Stimulation;Moist Heat;Patient/family education;Therapeutic exercise;Therapeutic activities;Manual techniques;Taping   PT Next Visit Plan  quadruped bird dogs for core; review past HEP   PT Home Exercise Plan progress as needed   Recommended Other Services None   Consulted and Agree with Plan of Care Patient      Patient will benefit from skilled therapeutic intervention in order to improve the following deficits and impairments:  Decreased range of motion, Decreased strength, Decreased mobility, Pain, Impaired flexibility  Visit Diagnosis: Bilateral low back pain without sciatica  Muscle weakness (generalized)       G-Codes - 2016-05-27 2025/01/11    Functional Assessment Tool Used FOTO; clinical judgement    Functional Limitation Mobility: Walking and moving around   Mobility: Walking and Moving Around Current Status [phone]) At least 40 percent but less than 60 percent impaired, limited or restricted   Mobility: Walking and Moving Around Goal Status [phone]) At least 20 percent but less than 40 percent impaired, limited or restricted      Problem List Patient Active Problem List   Diagnosis Date Noted  .  Lumbar stenosis with neurogenic  claudication 02/27/2016    Earlie Counts, PT 05/27/16 3:24 PM   [facility] [address] [place], [place], [address] Phone: [phone]   Fax:  [phone]  Name: Jimmy Rangel MRN: [id number] Date of Birth: 12/20/1945

## 2016-06-01 ENCOUNTER — Ambulatory Visit: Payer: Medicare Other | Admitting: Physical Therapy

## 2016-06-01 ENCOUNTER — Encounter: Payer: Self-pay | Admitting: Physical Therapy

## 2016-06-01 DIAGNOSIS — M545 Low back pain, unspecified: Secondary | ICD-10-CM

## 2016-06-01 DIAGNOSIS — M6281 Muscle weakness (generalized): Secondary | ICD-10-CM

## 2016-06-01 NOTE — Therapy (Signed)
[facility] [address], [address] [facility] [place] [place], [address] Phone: [phone]   Fax:  [phone]  Physical Therapy Treatment  Patient Details  Name: Jimmy LinerMichael L Rangel MRN: [id number] Date of Birth: 03/05/1946 Referring Provider: Dr. Newell CoralNudelman  Encounter Date: 06/01/2016      PT End of Session - 06/01/16 0955    Visit Number 3   Number of Visits 10   Date for PT Re-Evaluation 07/21/16   Authorization Type UHC Medicare;  G codes;  KX at visit 15   PT Start Time 0918   PT Stop Time 0956   PT Time Calculation (min) 38 min   Activity Tolerance Patient tolerated treatment well   Behavior [facility] for tasks assessed/performed      Past Medical History:  Diagnosis Date  . Arthritis   . Chronic back pain    stenosis  . GERD (gastroesophageal reflux disease)    takes Omeprazole daily  . Headache    rare  . History of bronchitis as a child   . Hyperlipidemia    takes Atorvastatin daily  . Weakness    left leg    Past Surgical History:  Procedure Laterality Date  . BACK SURGERY    . CHOLECYSTECTOMY    . COLONOSCOPY    . ESOPHAGOGASTRODUODENOSCOPY    . spleenectomy      There were no vitals filed for this visit.      Subjective Assessment - 06/01/16 0921    Subjective I feel a little sore, maybe from doing a lot of walking over the weekend   Pertinent History arthritis in feet;  19 years ago fusion L4-5;    Patient Stated Goals be as healthy as possible;  play golf;  mow grass (riding mower)     Currently in Pain? Yes   Pain Score 2    Pain Location Back   Pain Orientation Upper;Mid   Pain Descriptors / Indicators Sore                         [facility] Adult PT Treatment/Exercise - 06/01/16 0001      Lumbar Exercises: Stretches   Active Hamstring Stretch 2 reps;30 seconds   Double Knee to Chest Stretch 2 reps;30 seconds   Quadruped Mid Back Stretch 2 reps;30 seconds   Piriformis  Stretch 2 reps;20 seconds  seated     Lumbar Exercises: Aerobic   Stationary Bike nustep level 3 x 8 minutes     Lumbar Exercises: Standing   Other Standing Lumbar Exercises diagonals standing 25# using pulley machine 2 x 10 both directions     Lumbar Exercises: Supine   Dead Bug 15 reps   Bridge 15 reps;5 seconds   Other Supine Lumbar Exercises hooklying yellow plyo ball diagonals x 15 each                PT Education - 06/01/16 0955    Education provided Yes   Education Details golf stretches   Person(s) Educated Patient   Methods Explanation;Demonstration   Comprehension Verbalized understanding;Returned demonstration          PT Short Term Goals - 06/01/16 0957      PT SHORT TERM GOAL #1   Title The patient will demonstrate basic self care strategies to promote further surgical healing   06/23/16   Status On-going     PT SHORT TERM GOAL #2   Title The patient will have  improved HS and hip flexor muscle lengths to 60 and 10 degrees needed for return to light yardwork tasks   Status On-going     PT SHORT TERM GOAL #3   Title Patient will have improved hip mobility and strength to get up and down off the floor using good body mechanics   Status On-going           PT Long Term Goals - 06/01/16 0957      PT LONG TERM GOAL #1   Title The patient will be independent in safe self progression of HEP needed for further improvements in mobility and function  07/21/16   Status On-going     PT LONG TERM GOAL #2   Title Overall pain and discomfort will be < 3/10 with light yard work    Status On-going     PT LONG TERM GOAL #3   Title Patient will have improved core and hip strength needed to lift 10-15 # from floor to chest with proper body mechanics   Status On-going     PT LONG TERM GOAL #4   Title Patient will have spinal ROM adequate for future return to golf   Status On-going     PT LONG TERM GOAL #5   Title Patient will report overall pain and  function improved by > 70%   Status On-going     PT LONG TERM GOAL #6   Title FOTO functional outcome score improved from 52% to 43% indicating improved function with less pain   Status On-going               Plan - 06/01/16 0956    Clinical Impression Statement Pt feels "tight" with exercises but no pain. Pt excited to begin "golf simulation" exercises.  Pt will continue to benefit from continued strengthening and stretching   Rehab Potential Good   PT Frequency 2x / week   PT Duration 8 weeks   PT Next Visit Plan continue golf simulation stretching and strengthening   Consulted and Agree with Plan of Care Patient      Patient will benefit from skilled therapeutic intervention in order to improve the following deficits and impairments:  Decreased range of motion, Decreased strength, Decreased mobility, Pain, Impaired flexibility  Visit Diagnosis: Bilateral low back pain without sciatica  Muscle weakness (generalized)     Problem List Patient Active Problem List   Diagnosis Date Noted  . Lumbar stenosis with neurogenic claudication 02/27/2016    Isabelle Course, PT, DPT 06/01/2016, 9:58 AM  [facility] [address], [place], [place], [address] Phone: [phone]   Fax:  [phone]  Name: Jimmy Rangel MRN: [id number] Date of Birth: [birth year]

## 2016-06-03 ENCOUNTER — Ambulatory Visit: Payer: Medicare Other | Admitting: Physical Therapy

## 2016-06-03 ENCOUNTER — Encounter: Payer: Self-pay | Admitting: Physical Therapy

## 2016-06-03 DIAGNOSIS — M545 Low back pain, unspecified: Secondary | ICD-10-CM

## 2016-06-03 DIAGNOSIS — M6281 Muscle weakness (generalized): Secondary | ICD-10-CM

## 2016-06-03 NOTE — Patient Instructions (Signed)
Shoulder Retraction With Band    With band attached in front, pull arms back as if rowing a boat. Hold __1__ seconds. Repeat __20__ times. Do _3___ sessions per week.  Copyright  VHI. All rights reserved.  (Home) PNF: Flexion Diagonal Pattern, Standing    Right side toward anchor, reach up and out to same side. Pull rope down and out to other side, bending knees. Repeat __10__ times per set. Do __2__ sets per session. Do _3___ sessions per week. Use ____ lb weights.  Copyright  VHI. All rights reserved.  (Clinic) Combined: Diagonal Lift    Left side toward pulley, reach down to same side, bending knees and hips. Straighten knees and hips, pulling arms up and back in opposite direction. Repeat __10__ times per set. Do __2__ sets per session. Do __3__ sessions per week. Use ____ lb weights.  Copyright  VHI. All rights reserved.  (Home) Extension / Flexion (Assist)    Face anchor, right arm as far forward and up as is pain free. Pull arm down toward side. Repeat ___20_ times per set. Do _2___ sets per session. Do __3__ sessions per week. Use ____ lb weights.  Copyright  VHI. All rights reserved.   [facility] [address], [place], [place] [address] Phone # [phone] Fax [phone]

## 2016-06-03 NOTE — Therapy (Signed)
[facility] [address], [place], [place], [address] Phone: [phone]   Fax:  [phone]  Physical Therapy Treatment  Patient Details  Name: Jimmy Rangel MRN: [id number] Date of Birth: 12-03-1945 Referring Provider: Dr. Sherwood Gambler  Encounter Date: 06/03/2016      PT End of Session - 06/03/16 0954    Visit Number 4   Number of Visits 10   Date for PT Re-Evaluation 07/21/16   Authorization Type UHC Medicare;  G codes;  KX at visit 15   PT Start Time 0923   PT Stop Time 0956   PT Time Calculation (min) 33 min   Activity Tolerance Patient tolerated treatment well   Behavior During Therapy [facility] for tasks assessed/performed      Past Medical History:  Diagnosis Date  . Arthritis   . Chronic back pain    stenosis  . GERD (gastroesophageal reflux disease)    takes Omeprazole daily  . Headache    rare  . History of bronchitis as a child   . Hyperlipidemia    takes Atorvastatin daily  . Weakness    left leg    Past Surgical History:  Procedure Laterality Date  . BACK SURGERY    . CHOLECYSTECTOMY    . COLONOSCOPY    . ESOPHAGOGASTRODUODENOSCOPY    . spleenectomy      There were no vitals filed for this visit.      Subjective Assessment - 06/03/16 0926    Subjective Feeling sore today after mowing the grass on riding lawn mower.    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 35 puppy 10x/day uphill;  thinks he could walk a mile if flat   Diagnostic tests prior to surgery   Patient Stated Goals be as healthy as possible;  play golf;  mow grass (riding mower)     Pain Score 3    Pain Location Back   Pain Orientation Mid;Lower   Pain Descriptors / Indicators Sore   Pain Relieving Factors Advil                         [facility] Adult PT Treatment/Exercise - 06/03/16 0001      Lumbar  Exercises: Stretches   Active Hamstring Stretch 2 reps;30 seconds   Double Knee to Chest Stretch 2 reps;30 seconds   Quadruped Mid Back Stretch 2 reps;30 seconds   Piriformis Stretch 2 reps;20 seconds  seated     Lumbar Exercises: Aerobic   Stationary Bike Stationary bike L3 x6     Lumbar Exercises: Standing   Row AROM;Strengthening;Power tower;Both;10 reps   Shoulder Extension AROM;Strengthening;Power Tower;10 reps   Other Standing Lumbar Exercises diagonals standing 25# using pulley machine 2 x 10 both directions   Other Standing Lumbar Exercises D1 extension #15     Lumbar Exercises: Supine   Dead Bug 15 reps   Bridge 15 reps;5 seconds   Other Supine Lumbar Exercises hooklying yellow plyo ball diagonals x 15 each                PT Education - 06/03/16 0953    Education provided Yes   Education Details Strengthening exercises with bands   Person(s) Educated Patient   Methods Explanation;Handout   Comprehension Verbalized understanding          PT Short Term Goals -  06/01/16 0957      PT SHORT TERM GOAL #1   Title The patient will demonstrate basic self care strategies to promote further surgical healing   06/23/16   Status On-going     PT SHORT TERM GOAL #2   Title The patient will have improved HS and hip flexor muscle lengths to 60 and 10 degrees needed for return to light yardwork tasks   Status On-going     PT SHORT TERM GOAL #3   Title Patient will have improved hip mobility and strength to get up and down off the floor using good body mechanics   Status On-going           PT Long Term Goals - 06/01/16 0957      PT LONG TERM GOAL #1   Title The patient will be independent in safe self progression of HEP needed for further improvements in mobility and function  07/21/16   Status On-going     PT LONG TERM GOAL #2   Title Overall pain and discomfort will be < 3/10 with light yard work    Status On-going     PT LONG TERM GOAL #3   Title  Patient will have improved core and hip strength needed to lift 10-15 # from floor to chest with proper body mechanics   Status On-going     PT LONG TERM GOAL #4   Title Patient will have spinal ROM adequate for future return to golf   Status On-going     PT LONG TERM GOAL #5   Title Patient will report overall pain and function improved by > 70%   Status On-going     PT LONG TERM GOAL #6   Title FOTO functional outcome score improved from 52% to 43% indicating improved function with less pain   Status On-going               Plan - 06/03/16 0955    Clinical Impression Statement Pt able to tolerate all therex well and feels good after exercises. Pt purchased resitance bands and was given a hand out for stengthening exercises. Will continue to increase core stability and strength.    Rehab Potential Good   Clinical Impairments Affecting Rehab Potential None   PT Frequency 2x / week   PT Duration 8 weeks   PT Treatment/Interventions ADLs/Self Care Home Management;Cryotherapy;Electrical Stimulation;Moist Heat;Patient/family education;Therapeutic exercise;Therapeutic activities;Manual techniques;Taping   PT Next Visit Plan Continue to strengthen and stabilize core. Posture education.    PT Home Exercise Plan progress as needed   Consulted and Agree with Plan of Care Patient      Patient will benefit from skilled therapeutic intervention in order to improve the following deficits and impairments:  Decreased range of motion, Decreased strength, Decreased mobility, Pain, Impaired flexibility  Visit Diagnosis: Bilateral low back pain without sciatica  Muscle weakness (generalized)     Problem List Patient Active Problem List   Diagnosis Date Noted  . Lumbar stenosis with neurogenic claudication 02/27/2016    Mikle Bosworth PTA 06/03/2016, 10:01 AM  [facility] [address] [place], [place],  [address] Phone: [phone]   Fax:  [phone]  Name: Jimmy Rangel MRN: [id number] Date of Birth: 13-Feb-1946

## 2016-06-08 ENCOUNTER — Ambulatory Visit: Payer: Medicare Other | Admitting: Physical Therapy

## 2016-06-08 ENCOUNTER — Encounter: Payer: Self-pay | Admitting: Physical Therapy

## 2016-06-08 DIAGNOSIS — M545 Low back pain, unspecified: Secondary | ICD-10-CM

## 2016-06-08 DIAGNOSIS — M6281 Muscle weakness (generalized): Secondary | ICD-10-CM

## 2016-06-08 NOTE — Therapy (Signed)
[facility] [address], [place], [place], [address] Phone: [phone]   Fax:  [phone]  Physical Therapy Treatment  Patient Details  Name: Jimmy Rangel MRN: [id number] Date of Birth: 01/21/1946 Referring Provider: Dr. Sherwood Gambler  Encounter Date: 06/08/2016      PT End of Session - 06/08/16 1035    Visit Number 5   Number of Visits 10   Date for PT Re-Evaluation 07/21/16   Authorization Type UHC Medicare;  G codes;  KX at visit 15   PT Start Time 1000   PT Stop Time 1042   PT Time Calculation (min) 42 min   Activity Tolerance Patient tolerated treatment well   Behavior During Therapy [facility] for tasks assessed/performed      Past Medical History:  Diagnosis Date  . Arthritis   . Chronic back pain    stenosis  . GERD (gastroesophageal reflux disease)    takes Omeprazole daily  . Headache    rare  . History of bronchitis as a child   . Hyperlipidemia    takes Atorvastatin daily  . Weakness    left leg    Past Surgical History:  Procedure Laterality Date  . BACK SURGERY    . CHOLECYSTECTOMY    . COLONOSCOPY    . ESOPHAGOGASTRODUODENOSCOPY    . spleenectomy      There were no vitals filed for this visit.      Subjective Assessment - 06/08/16 1037    Subjective Pt denies pain but having pain today. Just some muscle soreness. Has been weaning self off of back brace and trying to stay active at home.    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 Walks puppy 10x/day uphill;  thinks he could walk a mile if flat   Diagnostic tests prior to surgery   Patient Stated Goals be as healthy as possible;  play golf;  mow grass (riding mower)     Currently in Pain? Yes   Pain Score 0-No pain                         [facility] Adult PT Treatment/Exercise - 06/08/16 0001      Lumbar Exercises:  Stretches   Active Hamstring Stretch 2 reps;30 seconds   Double Knee to Chest Stretch 2 reps;30 seconds   Quadruped Mid Back Stretch 2 reps;30 seconds   Piriformis Stretch 2 reps;20 seconds  seated     Lumbar Exercises: Aerobic   Stationary Bike Stationary bike L3 x6     Lumbar Exercises: Standing   Row AROM;Strengthening;Power tower;Both;10 reps  #25   Other Standing Lumbar Exercises diagonals standing 25# using pulley machine 2 x 10 both directions  #25   Other Standing Lumbar Exercises D1 extension #15     Lumbar Exercises: Seated   Sit to Stand 10 reps     Lumbar Exercises: Supine   Dead Bug 15 reps   Bridge 15 reps;5 seconds   Other Supine Lumbar Exercises hooklying yellow plyo ball diagonals x 15 each                  PT Short Term Goals - 06/08/16 1013      PT SHORT TERM GOAL #1   Title The patient will demonstrate basic self care strategies to promote further surgical healing  06/23/16   Time 4   Period Weeks   Status Achieved     PT SHORT TERM GOAL #2   Title The patient will have improved HS and hip flexor muscle lengths to 60 and 10 degrees needed for return to light yardwork tasks   Time 4   Period Weeks   Status On-going     PT SHORT TERM GOAL #3   Title Patient will have improved hip mobility and strength to get up and down off the floor using good body mechanics   Time 4   Period Weeks   Status On-going           PT Long Term Goals - 06/08/16 1014      PT LONG TERM GOAL #1   Title The patient will be independent in safe self progression of HEP needed for further improvements in mobility and function  07/21/16   Time 8   Period Weeks   Status On-going     PT LONG TERM GOAL #2   Title Overall pain and discomfort will be < 3/10 with light yard work    Time 8   Period Weeks   Status On-going     PT LONG TERM GOAL #3   Title Patient will have improved core and hip strength needed to lift 10-15 # from floor to chest with proper  body mechanics   Time 8   Period Weeks   Status On-going     PT LONG TERM GOAL #4   Title Patient will have spinal ROM adequate for future return to golf   Time 8   Period Weeks   Status On-going     PT LONG TERM GOAL #5   Title Patient will report overall pain and function improved by > 70%   Time 8   Period Weeks   Status On-going     PT LONG TERM GOAL #6   Title FOTO functional outcome score improved from 52% to 43% indicating improved function with less pain   Status On-going               Plan - 06/08/16 1036    Clinical Impression Statement Pt denies pain today but does have some mucsle soreness. Able to tolerate all exercises well. Pt reports he is doing some light yard work at home and is tolerating well. Cotiinue to strengthen and stability core. Continue to practive functional movement.     Rehab Potential Good   Clinical Impairments Affecting Rehab Potential None   PT Frequency 2x / week   PT Duration 8 weeks   PT Treatment/Interventions ADLs/Self Care Home Management;Cryotherapy;Electrical Stimulation;Moist Heat;Patient/family education;Therapeutic exercise;Therapeutic activities;Manual techniques;Taping   PT Next Visit Plan Continue to strengthen and stabilize core.   PT Home Exercise Plan progress as needed   Consulted and Agree with Plan of Care Patient      Patient will benefit from skilled therapeutic intervention in order to improve the following deficits and impairments:  Decreased range of motion, Decreased strength, Decreased mobility, Pain, Impaired flexibility  Visit Diagnosis: Bilateral low back pain without sciatica  Muscle weakness (generalized)     Problem List Patient Active Problem List   Diagnosis Date Noted  . Lumbar stenosis with neurogenic claudication 02/27/2016    Mikle Bosworth PTA 06/08/2016, 10:45 AM  [facility] [address], [place], [place],  [address] Phone: [phone]   Fax:  [phone]  Name: Jimmy Rangel MRN: [id number] Date  of Birth: 19-Sep-1946

## 2016-06-10 ENCOUNTER — Encounter: Payer: Self-pay | Admitting: Physical Therapy

## 2016-06-10 ENCOUNTER — Ambulatory Visit: Payer: Medicare Other | Admitting: Physical Therapy

## 2016-06-10 DIAGNOSIS — M545 Low back pain, unspecified: Secondary | ICD-10-CM

## 2016-06-10 DIAGNOSIS — M6281 Muscle weakness (generalized): Secondary | ICD-10-CM

## 2016-06-10 NOTE — Therapy (Signed)
[facility] [address], [place], [place], [address] Phone: [phone]   Fax:  [phone]  Physical Therapy Treatment  Patient Details  Name: Jimmy Rangel MRN: [id number] Date of Birth: [birth year] Referring Provider: Dr. Sherwood Gambler  Encounter Date: 06/10/2016      PT End of Session - 06/10/16 1000    Visit Number 6   Number of Visits 10   Date for PT Re-Evaluation 07/21/16   Authorization Type UHC Medicare;  G codes;  KX at visit 15   PT Start Time 0927   PT Stop Time 1007   PT Time Calculation (min) 40 min   Activity Tolerance Patient tolerated treatment well   Behavior During Therapy [facility] for tasks assessed/performed      Past Medical History:  Diagnosis Date  . Arthritis   . Chronic back pain    stenosis  . GERD (gastroesophageal reflux disease)    takes Omeprazole daily  . Headache    rare  . History of bronchitis as a child   . Hyperlipidemia    takes Atorvastatin daily  . Weakness    left leg    Past Surgical History:  Procedure Laterality Date  . BACK SURGERY    . CHOLECYSTECTOMY    . COLONOSCOPY    . ESOPHAGOGASTRODUODENOSCOPY    . spleenectomy      There were no vitals filed for this visit.      Subjective Assessment - 06/10/16 0927    Subjective Pt reports no pain just soreness. Reports he stood at an event for a couple of hours with no increase in pain.    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 Walks puppy 10x/day uphill;  thinks he could walk a mile if flat   Diagnostic tests prior to surgery   Patient Stated Goals be as healthy as possible;  play golf;  mow grass (riding mower)     Currently in Pain? No/denies                         [facility] Adult PT Treatment/Exercise - 06/10/16 0001      Lumbar Exercises: Stretches   Active Hamstring Stretch 2 reps;30  seconds   Double Knee to Chest Stretch 2 reps;30 seconds   Quadruped Mid Back Stretch 2 reps;30 seconds   Piriformis Stretch 2 reps;20 seconds  seated     Lumbar Exercises: Aerobic   Stationary Bike Nustep L3 x6 mins     Lumbar Exercises: Standing   Row AROM;Strengthening;Power tower;Both;10 reps  #25   Other Standing Lumbar Exercises diagonals standing 25# using pulley machine 2 x 10 both directions  #25   Other Standing Lumbar Exercises Resisted walking  35#     Lumbar Exercises: Seated   Sit to Stand 20 reps     Lumbar Exercises: Supine   Dead Bug 15 reps   Bridge 15 reps;5 seconds   Other Supine Lumbar Exercises Foam Roll Shoulder ABD diagonals yellow                  PT Short Term Goals - 06/10/16 [id number]      PT SHORT TERM GOAL #1   Title The patient will demonstrate basic self care strategies to promote further surgical healing   06/23/16   Time 4   Period Weeks  Status Achieved     PT SHORT TERM GOAL #2   Title The patient will have improved HS and hip flexor muscle lengths to 60 and 10 degrees needed for return to light yardwork tasks   Time 4   Period Weeks   Status On-going     PT SHORT TERM GOAL #3   Title Patient will have improved hip mobility and strength to get up and down off the floor using good body mechanics   Time 4   Period Weeks   Status On-going           PT Long Term Goals - 06/10/16 [id number]      PT LONG TERM GOAL #1   Title The patient will be independent in safe self progression of HEP needed for further improvements in mobility and function  07/21/16   Time 8   Period Weeks   Status On-going     PT LONG TERM GOAL #2   Title Overall pain and discomfort will be < 3/10 with light yard work    Time 8   Period Weeks   Status On-going     PT LONG TERM GOAL #3   Title Patient will have improved core and hip strength needed to lift 10-15 # from floor to chest with proper body mechanics   Time 8   Period Weeks   Status  On-going     PT LONG TERM GOAL #4   Title Patient will have spinal ROM adequate for future return to golf   Time 8   Period Weeks   Status On-going     PT LONG TERM GOAL #5   Title Patient will report overall pain and function improved by > 70%   Time 8   Period Weeks   Status On-going     PT LONG TERM GOAL #6   Title FOTO functional outcome score improved from 52% to 43% indicating improved function with less pain   Status On-going               Plan - 06/10/16 1004    Clinical Impression Statement Pt denies pain but reports just some soreness. Able to tolerate all exrecises well. Continue to strengthen hip flexors and extensors for proper body mechanics with lifting and daily activities.    Rehab Potential Good   Clinical Impairments Affecting Rehab Potential None   PT Frequency 2x / week   PT Duration 8 weeks   PT Treatment/Interventions ADLs/Self Care Home Management;Cryotherapy;Electrical Stimulation;Moist Heat;Patient/family education;Therapeutic exercise;Therapeutic activities;Manual techniques;Taping   PT Next Visit Plan Continue to strengthen and stabilize core.   PT Home Exercise Plan progress as needed   Consulted and Agree with Plan of Care Patient      Patient will benefit from skilled therapeutic intervention in order to improve the following deficits and impairments:  Decreased range of motion, Decreased strength, Decreased mobility, Pain, Impaired flexibility  Visit Diagnosis: Bilateral low back pain without sciatica  Muscle weakness (generalized)     Problem List Patient Active Problem List   Diagnosis Date Noted  . Lumbar stenosis with neurogenic claudication 02/27/2016    Mikle Bosworth PTA 06/10/2016, 10:08 AM  [facility] [address], [place], [place], [address] Phone: [phone]   Fax:  [phone]  Name: ABDULBASIT MCCOURY MRN: [id number] Date of Birth:  June 28, 1946

## 2016-06-16 ENCOUNTER — Encounter: Payer: Self-pay | Admitting: Physical Therapy

## 2016-06-16 ENCOUNTER — Ambulatory Visit: Payer: Medicare Other | Attending: Neurosurgery | Admitting: Physical Therapy

## 2016-06-16 DIAGNOSIS — M545 Low back pain, unspecified: Secondary | ICD-10-CM

## 2016-06-16 DIAGNOSIS — M6281 Muscle weakness (generalized): Secondary | ICD-10-CM | POA: Diagnosis present

## 2016-06-16 NOTE — Therapy (Signed)
[facility] [address] [place], [place], [address] Phone: [phone]   Fax:  [phone]  Physical Therapy Treatment  Patient Details  Name: Jimmy Rangel MRN: [id number] Date of Birth: September 04, 1946 Referring Provider: Dr. Sherwood Gambler  Encounter Date: 06/16/2016      PT End of Session - 06/16/16 1140    Visit Number 7   Number of Visits 10   Date for PT Re-Evaluation 07/21/16   Authorization Type UHC Medicare;  G codes;  KX at visit 15   PT Start Time 1102   PT Stop Time 1145   PT Time Calculation (min) 43 min   Activity Tolerance Patient tolerated treatment well   Behavior During Therapy [facility] for tasks assessed/performed      Past Medical History:  Diagnosis Date  . Arthritis   . Chronic back pain    stenosis  . GERD (gastroesophageal reflux disease)    takes Omeprazole daily  . Headache    rare  . History of bronchitis as a child   . Hyperlipidemia    takes Atorvastatin daily  . Weakness    left leg    Past Surgical History:  Procedure Laterality Date  . BACK SURGERY    . CHOLECYSTECTOMY    . COLONOSCOPY    . ESOPHAGOGASTRODUODENOSCOPY    . spleenectomy      There were no vitals filed for this visit.      Subjective Assessment - 06/16/16 1106    Subjective Pt reports back feeling good today. Some soreness in Lt low back. Pt reports having been doing well with home exercises.    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 Walks puppy 10x/day uphill;  thinks he could walk a mile if flat   Diagnostic tests prior to surgery   Patient Stated Goals be as healthy as possible;  play golf;  mow grass (riding mower)     Currently in Pain? Yes   Pain Score 2    Pain Location Back   Pain Orientation Mid;Lower   Pain Descriptors / Indicators Sore                         [facility] Adult PT  Treatment/Exercise - 06/16/16 0001      Lumbar Exercises: Stretches   Active Hamstring Stretch 2 reps;30 seconds   Double Knee to Chest Stretch 2 reps;30 seconds   Piriformis Stretch 2 reps;20 seconds  seated     Lumbar Exercises: Aerobic   Stationary Bike L3 6 mins     Lumbar Exercises: Standing   Row AROM;Strengthening;Power tower;Both;10 reps  #25   Other Standing Lumbar Exercises diagonals standing 25# using pulley machine 2 x 10 both directions  #25   Other Standing Lumbar Exercises Resisted walking  35#     Lumbar Exercises: Seated   Sit to Stand 20 reps   Sit to Stand Limitations Russian twist yellow ball      Lumbar Exercises: Supine   Bridge 20 reps   Other Supine Lumbar Exercises hooklying yellow plyo ball diagonals x 15 each   Other Supine Lumbar Exercises Foam Roll Shoulder ABD diagonals yellow                  PT Short Term Goals - 06/16/16 1107      PT SHORT TERM GOAL #  1   Title The patient will demonstrate basic self care strategies to promote further surgical healing   06/23/16   Period Weeks   Status Achieved     PT SHORT TERM GOAL #2   Title The patient will have improved HS and hip flexor muscle lengths to 60 and 10 degrees needed for return to light yardwork tasks   Time 4   Period Weeks   Status Achieved     PT SHORT TERM GOAL #3   Title Patient will have improved hip mobility and strength to get up and down off the floor using good body mechanics   Time 4   Period Weeks   Status Achieved           PT Long Term Goals - 06/16/16 1107      PT LONG TERM GOAL #1   Title The patient will be independent in safe self progression of HEP needed for further improvements in mobility and function  07/21/16   Time 8   Period Weeks   Status On-going     PT LONG TERM GOAL #2   Title Overall pain and discomfort will be < 3/10 with light yard work    Time 8   Period Weeks   Status Achieved     PT LONG TERM GOAL #3   Title Patient will  have improved core and hip strength needed to lift 10-15 # from floor to chest with proper body mechanics   Time 8   Period Weeks   Status On-going     PT LONG TERM GOAL #4   Title Patient will have spinal ROM adequate for future return to golf   Time 8   Period Weeks   Status On-going     PT LONG TERM GOAL #5   Title Patient will report overall pain and function improved by > 70%   Time 8   Period Weeks   Status On-going     PT LONG TERM GOAL #6   Title FOTO functional outcome score improved from 52% to 43% indicating improved function with less pain   Status On-going               Plan - 06/16/16 1132    Clinical Impression Statement Pt has met short term goals for hip flexor length and hip mobility for transfers floor to standing. Pt able to demonstrate trsansferring from quadruped on floor to standing useing good body mechanics and no pain. Pt reports still not feeling ready for full golf swing. Will continue ot strengthen core and increase stability.    Rehab Potential Good   Clinical Impairments Affecting Rehab Potential None   PT Frequency 2x / week   PT Duration 8 weeks   PT Treatment/Interventions ADLs/Self Care Home Management;Cryotherapy;Electrical Stimulation;Moist Heat;Patient/family education;Therapeutic exercise;Therapeutic activities;Manual techniques;Taping   PT Next Visit Plan Continue to strengthen and stabilize core.   PT Home Exercise Plan progress as needed   Consulted and Agree with Plan of Care Patient      Patient will benefit from skilled therapeutic intervention in order to improve the following deficits and impairments:  Decreased range of motion, Decreased strength, Decreased mobility, Pain, Impaired flexibility  Visit Diagnosis: Bilateral low back pain without sciatica  Muscle weakness (generalized)     Problem List Patient Active Problem List   Diagnosis Date Noted  . Lumbar stenosis with neurogenic claudication 02/27/2016     Mikle Bosworth PTA 06/16/2016, 2:07 PM  [facility] [address], [place], [place], [address] Phone: [phone]   Fax:  [phone]  Name: Jimmy Rangel MRN: [id number] Date of Birth: 1945-12-25

## 2016-06-18 ENCOUNTER — Ambulatory Visit: Payer: Medicare Other | Admitting: Physical Therapy

## 2016-06-18 DIAGNOSIS — M545 Low back pain, unspecified: Secondary | ICD-10-CM

## 2016-06-18 DIAGNOSIS — M6281 Muscle weakness (generalized): Secondary | ICD-10-CM

## 2016-06-18 NOTE — Therapy (Signed)
[facility] [address], [place], [place], [address] Phone: [phone]   Fax:  [phone]  Physical Therapy Treatment  Patient Details  Name: Jimmy Rangel MRN: [id number] Date of Birth: 1946-06-13 Referring Provider: Dr. Sherwood Gambler  Encounter Date: 06/18/2016      PT End of Session - 06/18/16 0959    Visit Number 8   Number of Visits 10   Date for PT Re-Evaluation 07/21/16   Authorization Type UHC Medicare;  G codes;  KX at visit 15   PT Start Time 0925   PT Stop Time 1008   PT Time Calculation (min) 43 min   Activity Tolerance Patient tolerated treatment well   Behavior During Therapy [facility] for tasks assessed/performed      Past Medical History:  Diagnosis Date  . Arthritis   . Chronic back pain    stenosis  . GERD (gastroesophageal reflux disease)    takes Omeprazole daily  . Headache    rare  . History of bronchitis as a child   . Hyperlipidemia    takes Atorvastatin daily  . Weakness    left leg    Past Surgical History:  Procedure Laterality Date  . BACK SURGERY    . CHOLECYSTECTOMY    . COLONOSCOPY    . ESOPHAGOGASTRODUODENOSCOPY    . spleenectomy      There were no vitals filed for this visit.      Subjective Assessment - 06/18/16 0925    Subjective Pt reports some increased pain in back today after throwing trash in dumpster yesterday.    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 Walks puppy 10x/day uphill;  thinks he could walk a mile if flat   Diagnostic tests prior to surgery   Patient Stated Goals be as healthy as possible;  play golf;  mow grass (riding mower)     Currently in Pain? Yes   Pain Score 2    Pain Location Back   Pain Orientation Mid;Lower   Pain Descriptors / Indicators Sore   Multiple Pain Sites No                         [facility] Adult PT  Treatment/Exercise - 06/18/16 0001      Lumbar Exercises: Stretches   Active Hamstring Stretch 2 reps;30 seconds   Double Knee to Chest Stretch 2 reps;30 seconds   Quadruped Mid Back Stretch 2 reps;30 seconds   Piriformis Stretch 2 reps;20 seconds  seated     Lumbar Exercises: Aerobic   Stationary Bike L3 6 mins     Lumbar Exercises: Standing   Other Standing Lumbar Exercises Rotating ball toss  yellow ball   Other Standing Lumbar Exercises Resisted walking  35#     Lumbar Exercises: Seated   Sit to Stand 20 reps  With yellow ball     Lumbar Exercises: Supine   Bridge 20 reps  yellow band   Straight Leg Raise 20 reps   Other Supine Lumbar Exercises hooklying yellow plyo ball diagonals x 15 each     Lumbar Exercises: Sidelying   Hip Abduction 20 reps     Lumbar Exercises: Prone   Other Prone Lumbar Exercises Quadruped kick back                PT Education - 06/18/16 [id number]  Education provided Yes   Education Details Straight leg raises   Person(s) Educated Patient   Methods Explanation;Handout;Demonstration   Comprehension Verbalized understanding          PT Short Term Goals - 06/16/16 1107      PT SHORT TERM GOAL #1   Title The patient will demonstrate basic self care strategies to promote further surgical healing   06/23/16   Period Weeks   Status Achieved     PT SHORT TERM GOAL #2   Title The patient will have improved HS and hip flexor muscle lengths to 60 and 10 degrees needed for return to light yardwork tasks   Time 4   Period Weeks   Status Achieved     PT SHORT TERM GOAL #3   Title Patient will have improved hip mobility and strength to get up and down off the floor using good body mechanics   Time 4   Period Weeks   Status Achieved           PT Long Term Goals - 06/16/16 1107      PT LONG TERM GOAL #1   Title The patient will be independent in safe self progression of HEP needed for further improvements in mobility and  function  07/21/16   Time 8   Period Weeks   Status On-going     PT LONG TERM GOAL #2   Title Overall pain and discomfort will be < 3/10 with light yard work    Time 8   Period Weeks   Status Achieved     PT LONG TERM GOAL #3   Title Patient will have improved core and hip strength needed to lift 10-15 # from floor to chest with proper body mechanics   Time 8   Period Weeks   Status On-going     PT LONG TERM GOAL #4   Title Patient will have spinal ROM adequate for future return to golf   Time 8   Period Weeks   Status On-going     PT LONG TERM GOAL #5   Title Patient will report overall pain and function improved by > 70%   Time 8   Period Weeks   Status On-going     PT LONG TERM GOAL #6   Title FOTO functional outcome score improved from 52% to 43% indicating improved function with less pain   Status On-going               Plan - 06/18/16 0959    Clinical Impression Statement Pt having some able to tolerate increase resistance with exercises and able to tolerate new exercises. Some difficulty with side lying hip abduction needs tactile cues for good form. Will continue to increase hip strength and stability.    Rehab Potential Good   Clinical Impairments Affecting Rehab Potential None   PT Frequency 2x / week   PT Duration 8 weeks   PT Treatment/Interventions ADLs/Self Care Home Management;Cryotherapy;Electrical Stimulation;Moist Heat;Patient/family education;Therapeutic exercise;Therapeutic activities;Manual techniques;Taping   PT Next Visit Plan Continue to strengthen and stabilize core.   Consulted and Agree with Plan of Care Patient      Patient will benefit from skilled therapeutic intervention in order to improve the following deficits and impairments:  Decreased range of motion, Decreased strength, Decreased mobility, Pain, Impaired flexibility  Visit Diagnosis: Bilateral low back pain without sciatica  Muscle weakness (generalized)     Problem  List Patient Active Problem List   Diagnosis Date  Noted  . Lumbar stenosis with neurogenic claudication 02/27/2016    Mikle Bosworth PTA 06/18/2016, 10:08 AM  [facility] [address], [place], [place], [address] Phone: [phone]   Fax:  [phone]  Name: Jimmy Rangel MRN: [id number] Date of Birth: [birth year]

## 2016-06-18 NOTE — Patient Instructions (Signed)
Hip Flexion / Knee Extension: Straight-Leg Raise (Eccentric)   Lie on back. Lift leg with knee straight. Slowly lower leg for 3-5 seconds. ___ reps per set, ___ sets per day, ___ days per week. Lower like elevator, stopping at each floor. Add ___ lbs when you achieve ___ repetitions. Rest on elbows. Rest on straight arms.  ABDUCTION: Side-Lying (Active)   Lie on left side, top leg straight. Raise top leg as far as possible.  Complete ___ sets of ___ repetitions. Perform ___ sessions per day.  [email or web address].[email or web address]   Jeanie Sewer PTA [facility] [address], [place],  [address] Phone # [phone] Fax [phone]

## 2016-06-22 ENCOUNTER — Ambulatory Visit: Payer: Medicare Other | Admitting: Physical Therapy

## 2016-06-22 ENCOUNTER — Encounter: Payer: Self-pay | Admitting: Physical Therapy

## 2016-06-22 DIAGNOSIS — M6281 Muscle weakness (generalized): Secondary | ICD-10-CM

## 2016-06-22 DIAGNOSIS — M545 Low back pain, unspecified: Secondary | ICD-10-CM

## 2016-06-22 NOTE — Therapy (Signed)
[facility] [address], [place], [place], [address] Phone: [phone]   Fax:  [phone]  Physical Therapy Treatment  Patient Details  Name: Jimmy Rangel MRN: [id number] Date of Birth: 1945/11/28 Referring Provider: Dr. Sherwood Gambler  Encounter Date: 06/22/2016      PT End of Session - 06/22/16 1018    Visit Number 9   Number of Visits 10   Date for PT Re-Evaluation 07/21/16   Authorization Type UHC Medicare;  G codes;  KX at visit 15   PT Start Time 1010   PT Stop Time 1050   PT Time Calculation (min) 40 min   Activity Tolerance Patient tolerated treatment well   Behavior During Therapy [facility] for tasks assessed/performed      Past Medical History:  Diagnosis Date  . Arthritis   . Chronic back pain    stenosis  . GERD (gastroesophageal reflux disease)    takes Omeprazole daily  . Headache    rare  . History of bronchitis as a child   . Hyperlipidemia    takes Atorvastatin daily  . Weakness    left leg    Past Surgical History:  Procedure Laterality Date  . BACK SURGERY    . CHOLECYSTECTOMY    . COLONOSCOPY    . ESOPHAGOGASTRODUODENOSCOPY    . spleenectomy      There were no vitals filed for this visit.      Subjective Assessment - 06/22/16 1008    Subjective Pt reports no pain today.    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 Walks puppy 10x/day uphill;  thinks he could walk a mile if flat   Diagnostic tests prior to surgery   Patient Stated Goals be as healthy as possible;  play golf;  mow grass (riding mower)     Currently in Pain? No/denies   Multiple Pain Sites No                         [facility] Adult PT Treatment/Exercise - 06/22/16 0001      Lumbar Exercises: Stretches   Active Hamstring Stretch 2 reps;30 seconds   Double Knee to Chest Stretch 2 reps;30 seconds   Piriformis Stretch 2 reps;20 seconds  seated     Lumbar Exercises: Aerobic   Stationary Bike L3 7 mins     Lumbar Exercises: Standing   Lifting From floor;10 reps  Box with 5#   Other Standing Lumbar Exercises Rotating ball toss  yellow ball   Other Standing Lumbar Exercises Resisted walking  35#     Lumbar Exercises: Seated   Sit to Stand --     Lumbar Exercises: Supine   Bridge 20 reps  yellow band   Straight Leg Raise 20 reps     Lumbar Exercises: Sidelying   Hip Abduction 20 reps     Lumbar Exercises: Prone   Other Prone Lumbar Exercises Quadruped kick back                  PT Short Term Goals - 06/22/16 1011      PT SHORT TERM GOAL #1   Title The patient will demonstrate basic self care strategies to promote further surgical healing   06/23/16   Time 4   Period Weeks   Status Achieved     PT SHORT  TERM GOAL #2   Title The patient will have improved HS and hip flexor muscle lengths to 60 and 10 degrees needed for return to light yardwork tasks   Time 4   Period Weeks   Status Achieved           PT Long Term Goals - 06/22/16 1012      PT LONG TERM GOAL #1   Title The patient will be independent in safe self progression of HEP needed for further improvements in mobility and function  07/21/16   Time 8   Period Weeks   Status Achieved     PT LONG TERM GOAL #2   Title Overall pain and discomfort will be < 3/10 with light yard work    Time 8   Period Weeks   Status Achieved     PT LONG TERM GOAL #4   Title Patient will have spinal ROM adequate for future return to golf   Time 8   Period Weeks   Status On-going     PT LONG TERM GOAL #5   Title Patient will report overall pain and function improved by > 70%   Time 8   Period Weeks   Status On-going     PT LONG TERM GOAL #6   Title FOTO functional outcome score improved from 52% to 43% indicating improved function with less pain   Status On-going               Plan -  06/22/16 1033    Clinical Impression Statement Pt progressing well with rehab. Pt has a good understanding of body mechnicans and proper techniues to maintain good posture. Pt has met all short term goals and continues to progress towards long term goals. Pt is satisfied with progress so far and is motivated to continue at execises at home. Will continue to increase strength and ROM for pt to return to all previous ADLs.    Rehab Potential Good   Clinical Impairments Affecting Rehab Potential None   PT Frequency 2x / week   PT Duration 8 weeks   PT Treatment/Interventions ADLs/Self Care Home Management;Cryotherapy;Electrical Stimulation;Moist Heat;Patient/family education;Therapeutic exercise;Therapeutic activities;Manual techniques;Taping   PT Next Visit Plan Continue to strengthen and stabilize core.   PT Home Exercise Plan progress as needed   Consulted and Agree with Plan of Care Patient      Patient will benefit from skilled therapeutic intervention in order to improve the following deficits and impairments:  Decreased range of motion, Decreased strength, Decreased mobility, Pain, Impaired flexibility  Visit Diagnosis: Bilateral low back pain without sciatica  Muscle weakness (generalized)     Problem List Patient Active Problem List   Diagnosis Date Noted  . Lumbar stenosis with neurogenic claudication 02/27/2016    Mikle Bosworth PTA 06/22/2016, 10:48 AM  [facility] [address] [place], [place], [address] Phone: [phone]   Fax:  [phone]  Name: Jimmy Rangel MRN: [id number] Date of Birth: 1946/09/17

## 2016-06-24 ENCOUNTER — Encounter: Payer: Self-pay | Admitting: Physical Therapy

## 2016-06-24 ENCOUNTER — Ambulatory Visit: Payer: Medicare Other | Admitting: Physical Therapy

## 2016-06-24 DIAGNOSIS — M545 Low back pain, unspecified: Secondary | ICD-10-CM

## 2016-06-24 DIAGNOSIS — M6281 Muscle weakness (generalized): Secondary | ICD-10-CM

## 2016-06-24 NOTE — Therapy (Signed)
[facility] [address] [place], [place], [address] Phone: [phone]   Fax:  [phone]  Physical Therapy Treatment  Patient Details  Name: Jimmy Rangel MRN: [id number] Date of Birth: 1946/03/01 Referring Provider: Dr. Sherwood Gambler  Encounter Date: 06/24/2016      PT End of Session - 06/24/16 1002    Visit Number 10   Number of Visits 10   Date for PT Re-Evaluation 07/21/16   Authorization Type UHC Medicare;  G codes;  KX at visit 15   PT Start Time 0932   PT Stop Time 1014   PT Time Calculation (min) 42 min   Activity Tolerance Patient tolerated treatment well   Behavior During Therapy [facility] for tasks assessed/performed      Past Medical History:  Diagnosis Date  . Arthritis   . Chronic back pain    stenosis  . GERD (gastroesophageal reflux disease)    takes Omeprazole daily  . Headache    rare  . History of bronchitis as a child   . Hyperlipidemia    takes Atorvastatin daily  . Weakness    left leg    Past Surgical History:  Procedure Laterality Date  . BACK SURGERY    . CHOLECYSTECTOMY    . COLONOSCOPY    . ESOPHAGOGASTRODUODENOSCOPY    . spleenectomy      There were no vitals filed for this visit.      Subjective Assessment - 06/24/16 0955    Subjective Pt reports no pain today. Pt reports feeling great after leaving therapy last treatment. Pt feels that he is ready to discontinue therapy and continue exercises at home.    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 Walks puppy 10x/day uphill;  thinks he could walk a mile if flat   Diagnostic tests prior to surgery   Patient Stated Goals be as healthy as possible;  play golf;  mow grass (riding mower)     Currently in Pain? No/denies            [facility] PT Assessment - 06/24/16 0001      Assessment   Medical Diagnosis lumbar fusion L2-3-4    Referring Provider Dr. Sherwood Gambler   Onset Date/Surgical Date 02/27/16   Hand Dominance Right   Next MD Visit November   Prior Therapy Had PT prior to surgery "it hurt"       Precautions   Precautions --  lifting 15-20#;  wean off brace in month of Aug     Restrictions   Weight Bearing Restrictions No     Balance Screen   Has the patient fallen in the past 6 months No   Has the patient had a decrease in activity level because of a fear of falling?  No   Is the patient reluctant to leave their home because of a fear of falling?  No     Home Environment   Living Environment Private residence   Living Arrangements Spouse/significant other   Home Access Stairs to enter   Home Layout Two level     Prior Function   Level of Independence Independent   Vocation Retired   Surveyor, minerals; work in yard     Observation/Other Assessments   Focus on [place] (FOTO)  6%  D/C CI goals: CK     Posture/Postural Control   Posture/Postural Control  Postural limitations   Postural Limitations Decreased lumbar lordosis     Strength   Right Hip Extension 5/5   Right Hip ABduction 4+/5   Left Hip Extension 5/5   Left Hip ABduction 4+/5   Lumbar Flexion 4+/5   Lumbar Extension 5/5     Flexibility   Hamstrings 26 LT 31 RT   Quadriceps hip flexors 11 LT 10 RT                     [facility] Adult PT Treatment/Exercise - 06/24/16 0001      Lumbar Exercises: Stretches   Active Hamstring Stretch 2 reps;30 seconds   Piriformis Stretch 2 reps;20 seconds  seated     Lumbar Exercises: Aerobic   Stationary Bike L3 7 mins     Lumbar Exercises: Standing   Other Standing Lumbar Exercises Rotating ball toss  yellow ball   Other Standing Lumbar Exercises Resisted walking  35#     Lumbar Exercises: Seated   Sit to Stand 20 reps  Blue theraball                  PT Short Term Goals - 06/24/16 0957      PT SHORT TERM GOAL #1   Title The patient will  demonstrate basic self care strategies to promote further surgical healing   06/23/16   Time 4   Period Weeks   Status Achieved     PT SHORT TERM GOAL #2   Title The patient will have improved HS and hip flexor muscle lengths to 60 and 10 degrees needed for return to light yardwork tasks   Time 4   Period Weeks   Status Achieved     PT SHORT TERM GOAL #3   Title Patient will have improved hip mobility and strength to get up and down off the floor using good body mechanics   Time 4   Period Weeks   Status Achieved           PT Long Term Goals - 06/24/16 4287      PT LONG TERM GOAL #1   Title The patient will be independent in safe self progression of HEP needed for further improvements in mobility and function  07/21/16   Time 8   Period Weeks   Status Achieved     PT LONG TERM GOAL #2   Title Overall pain and discomfort will be < 3/10 with light yard work    Time 8   Period Weeks   Status Achieved     PT LONG TERM GOAL #3   Title Patient will have improved core and hip strength needed to lift 10-15 # from floor to chest with proper body mechanics   Time 8   Period Weeks   Status Achieved     PT LONG TERM GOAL #4   Title Patient will have spinal ROM adequate for future return to golf   Time 8   Period Weeks   Status Achieved     PT LONG TERM GOAL #5   Title Patient will report overall pain and function improved by > 70%   Time 8   Period Weeks   Status Achieved     PT LONG TERM GOAL #6   Title FOTO functional outcome score improved from 52% to 43% indicating improved function with less pain   Time 8   Period Weeks   Status Achieved  Plan - July 03, 2016 1010    Clinical Impression Statement Pt has progressed well with therapy. Pt has met all short term goals and long term goals for rehab. Pt has requested to discontinue therapy and progress to home exercise program. Pt is pleased with progress and is well motivated to contiue independently.     Rehab Potential Good   Clinical Impairments Affecting Rehab Potential None   PT Treatment/Interventions ADLs/Self Care Home Management;Cryotherapy;Electrical Stimulation;Moist Heat;Patient/family education;Therapeutic exercise;Therapeutic activities;Manual techniques;Taping   PT Next Visit Plan Discharge to HEP   PT Home Exercise Plan Current HEP   Consulted and Agree with Plan of Care Patient      Patient will benefit from skilled therapeutic intervention in order to improve the following deficits and impairments:  Decreased range of motion, Decreased strength, Decreased mobility, Pain, Impaired flexibility  Visit Diagnosis: Bilateral low back pain without sciatica  Muscle weakness (generalized)       G-Codes - 2016/07/03 1131    Functional Assessment Tool Used FOTO; clinical judgement    Functional Limitation Mobility: Walking and moving around   Mobility: Walking and Moving Around Goal Status [phone]) At least 20 percent but less than 40 percent impaired, limited or restricted   Mobility: Walking and Moving Around Discharge Status [phone]) At least 1 percent but less than 20 percent impaired, limited or restricted      Problem List Patient Active Problem List   Diagnosis Date Noted  . Lumbar stenosis with neurogenic claudication 02/27/2016   Earlie Counts, PT 2016-07-03 3:08 PM   Mikle Bosworth, PTA 2016-07-03 1:45 PM [facility] [address], [place], [place], [address] Phone: [phone]   Fax:  [phone]  Name: Jimmy Rangel MRN: [id number] Date of Birth: 1946-02-13  PHYSICAL THERAPY DISCHARGE SUMMARY  Visits from Start of Care: 10  Current functional level related to goals / functional outcomes: See above.   Remaining deficits: See above.   Education / Equipment: HEP Plan: Patient agrees to discharge.  Patient goals were partially met. Patient is being discharged due to meeting the stated  rehab goals.  Thank you for the referral. Earlie Counts, PT 07-03-2016 3:12 PM  ?????

## 2016-06-29 ENCOUNTER — Encounter: Payer: Medicare Other | Admitting: Physical Therapy

## 2017-12-29 DIAGNOSIS — M25461 Effusion, right knee: Secondary | ICD-10-CM | POA: Insufficient documentation

## 2018-02-04 DIAGNOSIS — S83231A Complex tear of medial meniscus, current injury, right knee, initial encounter: Secondary | ICD-10-CM | POA: Insufficient documentation

## 2018-02-07 DIAGNOSIS — S83206A Unspecified tear of unspecified meniscus, current injury, right knee, initial encounter: Secondary | ICD-10-CM | POA: Insufficient documentation

## 2018-04-08 ENCOUNTER — Ambulatory Visit (HOSPITAL_COMMUNITY)
Admission: EM | Admit: 2018-04-08 | Discharge: 2018-04-08 | Disposition: A | Discharge status: Home / Self Care | Payer: Medicare Other | Attending: Family Medicine | Admitting: Family Medicine

## 2018-04-08 ENCOUNTER — Encounter (HOSPITAL_COMMUNITY): Payer: Self-pay | Admitting: Emergency Medicine

## 2018-04-08 DIAGNOSIS — S40862A Insect bite (nonvenomous) of left upper arm, initial encounter: Secondary | ICD-10-CM

## 2018-04-08 DIAGNOSIS — W57XXXA Bitten or stung by nonvenomous insect and other nonvenomous arthropods, initial encounter: Secondary | ICD-10-CM

## 2018-04-08 MED ORDER — DOXYCYCLINE HYCLATE 100 MG PO CAPS: 200.0000 mg | ORAL_CAPSULE | Freq: Once | ORAL | 0 refills | Status: AC

## 2018-04-08 MED ORDER — HYDROCORTISONE 2.5 % EX CREA: TOPICAL_CREAM | Freq: Two times a day (BID) | CUTANEOUS | 0 refills | Status: DC

## 2018-04-08 NOTE — Discharge Instructions (Signed)
Please take 2 tablets of doxycycline today, this is a preventative dose to prevent tickborne illnesses in case this is a tick bite  Please use hydrocortisone cream to apply to area twice daily to help with local reaction as well as itching.  Please monitor for worsening redness, swelling, pain or drainage, please return if developing headaches, fevers, joint pains, rashes over the next several weeks.

## 2018-04-08 NOTE — ED Triage Notes (Signed)
Pt noticed a spot on his L upper arm, possibly a bite by insect.

## 2018-04-08 NOTE — ED Provider Notes (Signed)
[place]    CSN: [id number] Arrival date & time: 04/08/18  1028     History   Chief Complaint Chief Complaint  Patient presents with  . Possible Insect Bite    HPI FISCHER HALLEY is a 72 y.o. male history of arthritis, spinal stenosis, GERD presenting today for evaluation of possible tick bite.  He woke up this morning and was concerned about a reddened area to his left upper arm.  It is associated with tenderness and mild itching.  He states that he has a dog and has had frequent ticks on him over the past few months.  He never sought attached tick.  He also recently had scratches to bilateral upper extremities from a dog, but this spot is new.  He has not taken anything or put anything on it.  HPI  Past Medical History:  Diagnosis Date  . Arthritis   . Chronic back pain    stenosis  . GERD (gastroesophageal reflux disease)    takes Omeprazole daily  . Headache    rare  . History of bronchitis as a child   . Hyperlipidemia    takes Atorvastatin daily  . Weakness    left leg    Patient Active Problem List   Diagnosis Date Noted  . Lumbar stenosis with neurogenic claudication 02/27/2016    Past Surgical History:  Procedure Laterality Date  . BACK SURGERY    . CHOLECYSTECTOMY    . COLONOSCOPY    . ESOPHAGOGASTRODUODENOSCOPY    . spleenectomy         Home Medications    Prior to Admission medications   Medication Sig Start Date End Date Taking? Authorizing Provider  diclofenac (VOLTAREN) 75 MG EC tablet Take 75 mg by mouth 2 (two) times daily.   Yes [provider]  omeprazole (PRILOSEC OTC) 20 MG tablet Take 20 mg by mouth every morning.   Yes [provider]  calcium carbonate (OS-CAL - DOSED IN MG OF ELEMENTAL CALCIUM) 1250 (500 Ca) MG tablet Take 1 tablet by mouth daily with breakfast.    [provider]  doxycycline (VIBRAMYCIN) 100 MG capsule Take 2 capsules (200 mg total) by mouth once for 1 dose. 04/08/18  04/08/18  Torina Ey C, PA-C  Glucos-Chondroit-Hyaluron-MSM (GLUCOSAMINE CHONDROITIN JOINT PO) Take 2 tablets by mouth daily.    [provider]  hydrocortisone 2.5 % cream Apply topically 2 (two) times daily. 04/08/18   Aster Eckrich, Elesa Hacker, PA-C    Family History No family history on file.  Social History Social History   Tobacco Use  . Smoking status: Never Smoker  . Smokeless tobacco: Never Used  Substance Use Topics  . Alcohol use: Yes    Comment: wine nightly  . Drug use: No     Allergies   Penicillins   Review of Systems Review of Systems  Constitutional: Negative for fatigue and fever.  Eyes: Negative for redness, itching and visual disturbance.  Respiratory: Negative for shortness of breath.   Cardiovascular: Negative for chest pain and leg swelling.  Gastrointestinal: Negative for nausea and vomiting.  Musculoskeletal: Negative for arthralgias and myalgias.  Skin: Positive for color change and wound. Negative for rash.  Neurological: Negative for dizziness, syncope, weakness, light-headedness and headaches.     Physical Exam Triage Vital Signs ED Triage Vitals  Enc Vitals Group     BP 04/08/18 1038 (!) 147/78     Pulse Rate 04/08/18 1038 84     Resp  04/08/18 1038 18     Temp 04/08/18 1038 98 F (36.7 C)     Temp src --      SpO2 04/08/18 1038 96 %     Weight --      Height --      Head Circumference --      Peak Flow --      Pain Score 04/08/18 1039 0     Pain Loc --      Pain Edu? --      Excl. in [place]? --    No data found.  Updated Vital Signs BP (!) 147/78   Pulse 84   Temp 98 F (36.7 C)   Resp 18   SpO2 96%   Visual Acuity Right Eye Distance:   Left Eye Distance:   Bilateral Distance:    Right Eye Near:   Left Eye Near:    Bilateral Near:     Physical Exam  Constitutional: He is oriented to person, place, and time. He appears well-developed and well-nourished.  No acute distress  HENT:  Head: Normocephalic and  atraumatic.  Nose: Nose normal.  Eyes: Conjunctivae are normal.  Neck: Neck supple.  Cardiovascular: Normal rate.  Pulmonary/Chest: Effort normal. No respiratory distress.  Abdominal: He exhibits no distension.  Musculoskeletal: Normal range of motion.  Neurological: He is alert and oriented to person, place, and time.  Skin: Skin is warm and dry. There is erythema.  1 cm x 1-1/2 cm area of erythema, central blackened area, appears scab-like  Multiple other areas on his right lower arm without erythema, mild scabbing, appear well-healing  Psychiatric: He has a normal mood and affect.  Nursing note and vitals reviewed.    UC Treatments / Results  Labs (all labs ordered are listed, but only abnormal results are displayed) Labs Reviewed - No data to display  EKG None  Radiology No results found.  Procedures Procedures (including critical care time)  Medications Ordered in UC Medications - No data to display  Initial Impression / Assessment and Plan / UC Course  I have reviewed the triage vital signs and the nursing notes.  Pertinent labs & imaging results that were available during my care of the patient were reviewed by me and considered in my medical decision making (see chart for details).     Patient with tick bite versus other insect bite.  Patient does not appear cellulitic at this time, will treat with prophylactic dose of doxycycline today, will apply hydrocortisone cream for local reaction.  Continue to monitor, discussed signs and symptoms of worsening infection as well as tickborne illness to monitor for.  Expect self resolution.Discussed strict return precautions. Patient verbalized understanding and is agreeable with plan.  Final Clinical Impressions(s) / UC Diagnoses   Final diagnoses:  Tick bite, initial encounter     Discharge Instructions     Please take 2 tablets of doxycycline today, this is a preventative dose to prevent tickborne illnesses in  case this is a tick bite  Please use hydrocortisone cream to apply to area twice daily to help with local reaction as well as itching.  Please monitor for worsening redness, swelling, pain or drainage, please return if developing headaches, fevers, joint pains, rashes over the next several weeks.   ED Prescriptions    Medication Sig Dispense Auth. Provider   doxycycline (VIBRAMYCIN) 100 MG capsule Take 2 capsules (200 mg total) by mouth once for 1 dose. 2 capsule Miaisabella Bacorn, [place] C, PA-C  hydrocortisone 2.5 % cream Apply topically 2 (two) times daily. 30 g Vera Wishart, [place] C, PA-C     Controlled Substance Prescriptions [place] Controlled Substance Registry consulted? Not Applicable   Janith Lima, [place] 04/08/18 1056

## 2018-08-26 DIAGNOSIS — K439 Ventral hernia without obstruction or gangrene: Secondary | ICD-10-CM | POA: Insufficient documentation

## 2018-08-26 DIAGNOSIS — K219 Gastro-esophageal reflux disease without esophagitis: Secondary | ICD-10-CM | POA: Insufficient documentation

## 2018-08-26 DIAGNOSIS — Z9081 Acquired absence of spleen: Secondary | ICD-10-CM | POA: Insufficient documentation

## 2018-09-07 ENCOUNTER — Encounter (HOSPITAL_COMMUNITY): Payer: Self-pay | Admitting: Physician Assistant

## 2018-09-07 DIAGNOSIS — M1711 Unilateral primary osteoarthritis, right knee: Secondary | ICD-10-CM | POA: Diagnosis present

## 2018-09-07 NOTE — H&P (Signed)
TOTAL KNEE ADMISSION H&P  Patient is being admitted for right total knee arthroplasty.  Subjective:  Chief Complaint:right knee pain.  HPI: Jimmy Rangel, 72 y.o. male, has a history of pain and functional disability in the right knee due to arthritis and has failed non-surgical conservative treatments for greater than 12 weeks to includeNSAID's and/or analgesics, corticosteriod injections, viscosupplementation injections, flexibility and strengthening excercises, supervised PT with diminished ADL's post treatment, use of assistive devices and activity modification.  Onset of symptoms was gradual, starting 10 years ago with gradually worsening course since that time. The patient noted prior procedures on the knee to include  arthroscopy and menisectomy on the right knee(s).  Patient currently rates pain in the right knee(s) at 10 out of 10 with activity. Patient has night pain, worsening of pain with activity and weight bearing, pain that interferes with activities of daily living, crepitus and joint swelling.  Patient has evidence of subchondral sclerosis, periarticular osteophytes and joint space narrowing by imaging studies. There is no active infection.  Patient Active Problem List   Diagnosis Date Noted  . Primary localized osteoarthritis of right knee   . 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 Arthritis   . Chronic back pain    stenosis  . GERD (gastroesophageal reflux disease)    takes Omeprazole daily  . Headache    rare  . History of bronchitis as a child   . Hyperlipidemia    takes Atorvastatin daily  . Primary localized osteoarthritis of right knee   . Weakness    left leg    Past Surgical History:  Procedure Laterality Date  . BACK SURGERY    . CHOLECYSTECTOMY    . COLONOSCOPY    . ESOPHAGOGASTRODUODENOSCOPY    . spleenectomy      No current facility-administered medications for this encounter.     Current Outpatient Medications  Medication Sig Dispense Refill Last Dose  . diclofenac (VOLTAREN) 75 MG EC tablet Take 75 mg by mouth 2 (two) times daily.   09/07/2018 at Unknown time  . fluticasone (FLONASE) 50 MCG/ACT nasal spray Place 2 sprays into both nostrils at bedtime as needed for allergies or rhinitis.   09/06/2018 at Unknown time  . Glucos-Chondroit-Hyaluron-MSM (GLUCOSAMINE CHONDROITIN JOINT PO) Take 1 tablet by mouth daily.    Past Week at Unknown time  . Omega-3 Fatty Acids (FISH OIL) 1200 MG CAPS Take 1,200 mg by mouth daily.   Past Week at Unknown time  . omeprazole (PRILOSEC) 20 MG capsule Take 20 mg by mouth daily.   09/07/2018 at Unknown time  . hydrocortisone 2.5 % cream Apply topically 2 (two) times daily. (Patient not taking: Reported on 09/02/2018) 30 g 0 Completed Course at Unknown time   Allergies  Allergen Reactions  . Penicillins Other (See Comments)    Passed out Has patient had a PCN reaction causing immediate rash, facial/tongue/throat swelling, SOB or lightheadedness with hypotension: Yes Has patient had a PCN reaction causing severe rash involving mucus membranes or skin necrosis: No Has patient had a PCN reaction that required hospitalization: No Has patient had a PCN reaction occurring within the last 10 years: No If all of the above answers are "NO", then may proceed with Cephalosporin use.     Social History   Tobacco Use  . Smoking status: Never Smoker  . Smokeless tobacco: Never Used  Substance Use Topics  . Alcohol use: Yes  Comment: wine nightly    Family History  Problem Relation Age of Onset  . Leukemia Mother   . Lung cancer Father      Review of Systems  Constitutional: Negative.   HENT: Negative.   Eyes: Negative.   Respiratory: Negative.   Cardiovascular: Negative.   Gastrointestinal: Negative.   Genitourinary: Negative.   Musculoskeletal: Positive for back pain and joint pain.  Skin: Negative.   Neurological: Negative.    Endo/Heme/Allergies: Negative.   Psychiatric/Behavioral: Negative.     Objective:  Physical Exam  Constitutional: He appears well-developed and well-nourished.  HENT:  Head: Normocephalic and atraumatic.  Mouth/Throat: Oropharynx is clear and moist.  Eyes: Pupils are equal, round, and reactive to light. Conjunctivae are normal.  Neck: Neck supple.  Cardiovascular: Normal rate and regular rhythm.  Murmur heard. Respiratory: Effort normal and breath sounds normal.  GI: Soft. Bowel sounds are normal.  Genitourinary:  Genitourinary Comments: Not pertinent to current symptomatology therefore not examined.  Musculoskeletal:  .  Examination of his right knee reveals pain medially and laterally.  1+ crepitation.  1+ synovitis.  Range of motion -5-120 degrees.  Knee is stable with mild varus deformity and normal patella tracking.  Examination of the left knee reveals posterior knee pain.  Mildly positive medial McMurray's.  Full range of motion.  Knee is stable with normal patella tracking    Vital signs in last 24 hours: Temp:  [97.6 F (36.4 C)] 97.6 F (36.4 C) (11/27 1400) Pulse Rate:  [84] 84 (11/27 1400) BP: (142)/(80) 142/80 (11/27 1400) SpO2:  [96 %] 96 % (11/27 1400) Weight:  [95.3 kg] 95.3 kg (11/27 1400)  Labs:   Estimated body mass index is 30.13 kg/m as calculated from the following:   Height as of this encounter: 5\' 10"  (1.778 m).   Weight as of this encounter: 95.3 kg.   Imaging Review Plain radiographs demonstrate severe degenerative joint disease of the right knee(s). The overall alignment issignificant varus. The bone quality appears to be excellent for age and reported activity level.   Preoperative templating of the joint replacement has been completed, documented, and submitted to the Operating Room personnel in order to optimize intra-operative equipment management.    Patient's anticipated LOS is less than 2 midnights, meeting these requirements: -  Younger than 72 - Lives within 1 hour of care - Has a competent adult at home to recover with post-op recover - NO history of  - Chronic pain requiring opiods  - Diabetes  - Coronary Artery Disease  - Heart failure  - Heart attack  - Stroke  - DVT/VTE  - Cardiac arrhythmia  - Respiratory Failure/COPD  - Renal failure  - Anemia  - Advanced Liver disease        Assessment/Plan:  End stage arthritis, right knee   The patient history, physical examination, clinical judgment of the provider and imaging studies are consistent with end stage degenerative joint disease of the right knee(s) and total knee arthroplasty is deemed medically necessary. The treatment options including medical management, injection therapy arthroscopy and arthroplasty were discussed at length. The risks and benefits of total knee arthroplasty were presented and reviewed. The risks due to aseptic loosening, infection, stiffness, patella tracking problems, thromboembolic complications and other imponderables were discussed. The patient acknowledged the explanation, agreed to proceed with the plan and consent was signed. Patient is being admitted for inpatient treatment for surgery, pain control, PT, OT, prophylactic antibiotics, VTE prophylaxis, progressive ambulation and ADL's and  discharge planning. The patient is planning to be discharged home with home health services  He is approved as an outpatient by his insurance

## 2018-09-07 NOTE — Pre-Procedure Instructions (Signed)
Jimmy Rangel  09/07/2018      [id number] [place], [place] - [address] [address] [place] [address] Phone: [phone] Fax: [phone]    Your procedure is scheduled on Mon., Dec. 9, 2019 from 7:15AM-9:39AM  Report to [facility] Admitting Entrance "A" at 5:30AM  Call this number if you have problems the morning of surgery:  [phone]   Remember:  Do not eat or drink after midnight on Dec. 8th    Take these medicines the morning of surgery with A SIP OF WATER: Omeprazole (PRILOSEC)   If needed: Fluticasone (FLONASE)   7 days before surgery (09/12/18), stop taking all Other Aspirin Products, Vitamins, Fish oils, and Herbal medications. Also stop all NSAIDS i.e. Advil, Ibuprofen, Motrin, Aleve, Anaprox, Naproxen, BC, Goody Powders, and all Supplements. Including: Diclofenac (VOLTAREN)   Do not wear jewelry.  Do not wear lotions, powders, colognes, or deodorant.  Do not shave 48 hours prior to surgery.  Men may shave face.  Do not bring valuables to the hospital.  [facility] is not responsible for any belongings or valuables.  Contacts, dentures or bridgework may not be worn into surgery.  Leave your suitcase in the car.  After surgery it may be brought to your room.  For patients admitted to the hospital, discharge time will be determined by your treatment team.  Patients discharged the day of surgery will not be allowed to drive home.   Special instructions:  [place]- Preparing For Surgery  Before surgery, you can play an important role. Because skin is not sterile, your skin needs to be as free of germs as possible. You can reduce the number of germs on your skin by washing with CHG (chlorahexidine gluconate) Soap before surgery.  CHG is an antiseptic cleaner which kills germs and bonds with the skin to continue killing germs even after washing.    Oral Hygiene is also important to reduce  your risk of infection.  Remember - BRUSH YOUR TEETH THE MORNING OF SURGERY WITH YOUR REGULAR TOOTHPASTE  Please do not use if you have an allergy to CHG or antibacterial soaps. If your skin becomes reddened/irritated stop using the CHG.  Do not shave (including legs and underarms) for at least 48 hours prior to first CHG shower. It is OK to shave your face.  Please follow these instructions carefully.   1. Shower the NIGHT BEFORE SURGERY and the MORNING OF SURGERY with CHG.   2. If you chose to wash your hair, wash your hair first as usual with your normal shampoo.  3. After you shampoo, rinse your hair and body thoroughly to remove the shampoo.  4. Use CHG as you would any other liquid soap. You can apply CHG directly to the skin and wash gently with a scrungie or a clean washcloth.   5. Apply the CHG Soap to your body ONLY FROM THE NECK DOWN.  Do not use on open wounds or open sores. Avoid contact with your eyes, ears, mouth and genitals (private parts). Wash Face and genitals (private parts)  with your normal soap.  6. Wash thoroughly, paying special attention to the area where your surgery will be performed.  7. Thoroughly rinse your body with warm water from the neck down.  8. DO NOT shower/wash with your normal soap after using and rinsing off the CHG Soap.  9. Pat yourself dry with a CLEAN TOWEL.  10. Wear CLEAN PAJAMAS to bed the night before surgery, wear comfortable clothes the morning of surgery  11. Place CLEAN SHEETS on your bed the night of your first shower and DO NOT SLEEP WITH PETS.  Day of Surgery:  Do not apply any deodorants/lotions.  Please wear clean clothes to the hospital/surgery center.   Remember to brush your teeth WITH YOUR REGULAR TOOTHPASTE.  Please read over the following fact sheets that you were given. Pain Booklet, Coughing and Deep Breathing, MRSA Information and Surgical Site Infection Prevention

## 2018-09-09 ENCOUNTER — Encounter (HOSPITAL_COMMUNITY): Payer: Self-pay

## 2018-09-09 ENCOUNTER — Other Ambulatory Visit: Payer: Self-pay

## 2018-09-09 ENCOUNTER — Encounter (HOSPITAL_COMMUNITY)
Admission: RE | Admit: 2018-09-09 | Discharge: 2018-09-09 | Disposition: A | Discharge status: Home / Self Care | Payer: Medicare Other | Source: Ambulatory Visit | Attending: Orthopedic Surgery | Admitting: Orthopedic Surgery

## 2018-09-09 DIAGNOSIS — Z79899 Other long term (current) drug therapy: Secondary | ICD-10-CM | POA: Diagnosis not present

## 2018-09-09 DIAGNOSIS — I444 Left anterior fascicular block: Secondary | ICD-10-CM | POA: Insufficient documentation

## 2018-09-09 DIAGNOSIS — E785 Hyperlipidemia, unspecified: Secondary | ICD-10-CM | POA: Insufficient documentation

## 2018-09-09 DIAGNOSIS — M1711 Unilateral primary osteoarthritis, right knee: Secondary | ICD-10-CM | POA: Diagnosis not present

## 2018-09-09 DIAGNOSIS — Z01818 Encounter for other preprocedural examination: Secondary | ICD-10-CM | POA: Insufficient documentation

## 2018-09-09 DIAGNOSIS — G4733 Obstructive sleep apnea (adult) (pediatric): Secondary | ICD-10-CM | POA: Diagnosis not present

## 2018-09-09 DIAGNOSIS — Z96651 Presence of right artificial knee joint: Secondary | ICD-10-CM | POA: Diagnosis not present

## 2018-09-09 DIAGNOSIS — M549 Dorsalgia, unspecified: Secondary | ICD-10-CM | POA: Diagnosis not present

## 2018-09-09 DIAGNOSIS — Z9889 Other specified postprocedural states: Secondary | ICD-10-CM | POA: Diagnosis not present

## 2018-09-09 DIAGNOSIS — Z9049 Acquired absence of other specified parts of digestive tract: Secondary | ICD-10-CM | POA: Insufficient documentation

## 2018-09-09 DIAGNOSIS — G8929 Other chronic pain: Secondary | ICD-10-CM | POA: Diagnosis not present

## 2018-09-09 DIAGNOSIS — K219 Gastro-esophageal reflux disease without esophagitis: Secondary | ICD-10-CM | POA: Diagnosis not present

## 2018-09-09 HISTORY — DX: Sleep apnea, unspecified: G47.30

## 2018-09-09 LAB — CBC WITH DIFFERENTIAL/PLATELET
Abs Immature Granulocytes: 0.03 10*3/uL (ref 0.00–0.07)
BASOS ABS: 0.1 10*3/uL (ref 0.0–0.1)
BASOS PCT: 1 %
EOS ABS: 0.3 10*3/uL (ref 0.0–0.5)
EOS PCT: 3 %
HEMATOCRIT: 42.2 % (ref 39.0–52.0)
Hemoglobin: 13.5 g/dL (ref 13.0–17.0)
IMMATURE GRANULOCYTES: 0 %
LYMPHS ABS: 4 10*3/uL (ref 0.7–4.0)
Lymphocytes Relative: 40 %
MCH: 31.5 pg (ref 26.0–34.0)
MCHC: 32 g/dL (ref 30.0–36.0)
MCV: 98.4 fL (ref 80.0–100.0)
Monocytes Absolute: 1 10*3/uL (ref 0.1–1.0)
Monocytes Relative: 10 %
NEUTROS PCT: 46 %
NRBC: 0 % (ref 0.0–0.2)
Neutro Abs: 4.5 10*3/uL (ref 1.7–7.7)
PLATELETS: 417 10*3/uL — AB (ref 150–400)
RBC: 4.29 MIL/uL (ref 4.22–5.81)
RDW: 13.4 % (ref 11.5–15.5)
WBC: 10 10*3/uL (ref 4.0–10.5)

## 2018-09-09 LAB — COMPREHENSIVE METABOLIC PANEL
ALBUMIN: 4 g/dL (ref 3.5–5.0)
ALT: 38 U/L (ref 0–44)
ANION GAP: 9 (ref 5–15)
AST: 25 U/L (ref 15–41)
Alkaline Phosphatase: 51 U/L (ref 38–126)
BILIRUBIN TOTAL: 0.4 mg/dL (ref 0.3–1.2)
BUN: 15 mg/dL (ref 8–23)
CHLORIDE: 104 mmol/L (ref 98–111)
CO2: 23 mmol/L (ref 22–32)
Calcium: 9.6 mg/dL (ref 8.9–10.3)
Creatinine, Ser: 0.76 mg/dL (ref 0.61–1.24)
GFR calc Af Amer: >60 mL/min (ref >60)
GLUCOSE: 96 mg/dL (ref 70–99)
POTASSIUM: 4.1 mmol/L (ref 3.5–5.1)
Sodium: 136 mmol/L (ref 135–145)
Total Protein: 7.4 g/dL (ref 6.5–8.1)

## 2018-09-09 LAB — SURGICAL PCR SCREEN
MRSA, PCR: POSITIVE — AB
Staphylococcus aureus: POSITIVE — AB

## 2018-09-09 LAB — PROTIME-INR
INR: 0.98
Prothrombin Time: 12.9 seconds (ref 11.4–15.2)

## 2018-09-09 LAB — TYPE AND SCREEN
ABO/RH(D): A NEG
ANTIBODY SCREEN: NEGATIVE

## 2018-09-09 LAB — APTT: APTT: 30 s (ref 24–36)

## 2018-09-09 NOTE — Progress Notes (Signed)
PCP - Anastasia Pall Cardiologist - denies  EKG - 09/09/2018   Sleep Study - with Jim Desanctis- currently retired CPAP - wears CPAP at night, does not know settings  Anesthesia review: Request was made for Anesthesia APP to see pt at PAT appointment d/t heart murmur heard at Dr. Archie Endo office. Levada Dy to see pt at appointment today.   Pt denies shortness of breath, leg swelling, or chest pain.   Patient denies shortness of breath, fever, cough and chest pain at PAT appointment   Patient verbalized understanding of instructions that were given to them at the PAT appointment. Patient was also instructed that they will need to review over the PAT instructions again at home before surgery.

## 2018-09-09 NOTE — Progress Notes (Signed)
Anesthesia consult:    Case:  [id number] Date/Time:  09/19/18 0700   Procedures:      TOTAL KNEE ARTHROPLASTY (Right )     LEFT KNEE INJECTION (Left )   Anesthesia type:  Spinal   Pre-op diagnosis:  djd right knee   Location:  [facility] OR ROOM 07 / [facility]   Surgeon:  Elsie Saas, MD      DISCUSSION:  - Pt is a 72 year old male with hx OSA, hyperlipidemia.   - Asked to see pt in pre-admission testing by Luna Glasgow, PA for Dr. Noemi Chapel. Ms. Shepperson ausculted a possible new aortic murmur.  I concur that pt has a 2/6 murmur heard best at right upper sternal border.  I spoke with Ms. Shepperson who will arrange cardiology evaluation for pt prior to surgery.   - At pre-admission testing, pt denied fatigue, DOE, SOB, dizziness, or syncope.  Activity limited right now by knee pain, but he is able to climb stairs to 2nd floor in his house without any CV symptoms.  No hx heart murmur.    VS: BP (!) 147/81   Pulse 81   Temp 36.9 C (Oral)   Resp 16   Ht 5\' 10"  (1.778 m)   Wt 95.1 kg   SpO2 97%   BMI 30.09 kg/m    PROVIDERS: - PCP is Chesley Noon, MD (notes in care everywhere)    LABS: Labs reviewed: Acceptable for surgery. (all labs ordered are listed, but only abnormal results are displayed)  Labs Reviewed  SURGICAL PCR SCREEN - Abnormal; Notable for the following components:      Result Value   MRSA, PCR POSITIVE (*)    Staphylococcus aureus POSITIVE (*)    All other components within normal limits  CBC WITH DIFFERENTIAL/PLATELET - Abnormal; Notable for the following components:   Platelets 417 (*)    All other components within normal limits  URINE CULTURE  APTT  COMPREHENSIVE METABOLIC PANEL  PROTIME-INR  TYPE AND SCREEN    EKG 09/09/18: NSR with frequent premature atrial complexes (PACs). LAFB.    CV:  Past Medical History:  Diagnosis Date  . Arthritis   . Chronic back pain    stenosis  . GERD (gastroesophageal reflux disease)    takes Omeprazole  daily  . Headache    rare  . History of bronchitis as a child   . Hyperlipidemia    takes Atorvastatin daily  . Primary localized osteoarthritis of right knee   . Sleep apnea    uses CPAP, does not know settings  . Weakness    left leg    Past Surgical History:  Procedure Laterality Date  . BACK SURGERY     Lumbar fusion x2, 1997, 2016  . CHOLECYSTECTOMY    . COLONOSCOPY    . ESOPHAGOGASTRODUODENOSCOPY    . KNEE ARTHROSCOPY Right   . spleenectomy      MEDICATIONS: . diclofenac (VOLTAREN) 75 MG EC tablet  . fluticasone (FLONASE) 50 MCG/ACT nasal spray  . Glucos-Chondroit-Hyaluron-MSM (GLUCOSAMINE CHONDROITIN JOINT PO)  . hydrocortisone 2.5 % cream  . Omega-3 Fatty Acids (FISH OIL) 1200 MG CAPS  . omeprazole (PRILOSEC) 20 MG capsule   No current facility-administered medications for this encounter.     Will leave chart for f/u after pt has cardiac eval.   Willeen Cass, FNP-BC [facility] Short Stay Surgical Center/Anesthesiology Phone: [phone] 09/09/2018 2:13 PM

## 2018-09-11 LAB — URINE CULTURE: Culture: NO GROWTH

## 2018-09-15 ENCOUNTER — Ambulatory Visit: Payer: Medicare Other | Admitting: Cardiology

## 2018-09-15 ENCOUNTER — Encounter: Payer: Self-pay | Admitting: Cardiology

## 2018-09-15 DIAGNOSIS — Z01810 Encounter for preprocedural cardiovascular examination: Secondary | ICD-10-CM

## 2018-09-15 NOTE — Progress Notes (Signed)
Cardiology Office Note:    Date:  09/15/2018   ID:  Jimmy Rangel, DOB January 31, 1946, MRN [id number]  PCP:  Chesley Noon, MD  Cardiologist:  Jenean Lindau, MD   Referring MD: Elsie Saas, MD    ASSESSMENT:    1. Pre-operative cardiovascular examination    PLAN:    In order of problems listed above:  1. Primary prevention stressed with the patient.  Importance of compliance with diet and medication stressed and he vocalized understanding. 2. Preop risk stratification process explained to him at length.  Patient will have a echocardiogram to assess murmur heard on auscultation and a Lexiscan sestamibi.  If these tests are negative he is not at high risk for coronary events during the aforementioned surgery.  Meticulous hemodynamic monitoring will further reduce the risk of coronary events. 3. He will be seen in follow-up appointment on a as needed basis only.   Medication Adjustments/Labs and Tests Ordered: Current medicines are reviewed at length with the patient today.  Concerns regarding medicines are outlined above.  No orders of the defined types were placed in this encounter.  No orders of the defined types were placed in this encounter.    History of Present Illness:    Jimmy Rangel is a 72 y.o. male who is being seen today for the evaluation of preop cardiovascular assessment and cardiac murmur at the request of Elsie Saas, MD.  Patient is a pleasant 72 year old male.  He denies any history of hypertension.  He is not sure about his cholesterol status.  He was planning to undergo knee surgery and was incidentally found to have a murmur and sent here for evaluation.  He leads a sedentary lifestyle for obvious reasons.  No chest pain orthopnea or PND.  At the time of my evaluation, the patient is alert awake oriented and in no distress.  Patient denies any chest tightness or any symptoms suggesting of angina or its equivalent.  Past Medical History:  Diagnosis  Date  . Arthritis   . Chronic back pain    stenosis  . GERD (gastroesophageal reflux disease)    takes Omeprazole daily  . Headache    rare  . History of bronchitis as a child   . Hyperlipidemia    takes Atorvastatin daily  . Primary localized osteoarthritis of right knee   . Sleep apnea    uses CPAP, does not know settings  . Weakness    left leg    Past Surgical History:  Procedure Laterality Date  . BACK SURGERY     Lumbar fusion x2, 1997, 2016  . CHOLECYSTECTOMY    . COLONOSCOPY    . ESOPHAGOGASTRODUODENOSCOPY    . KNEE ARTHROSCOPY Right   . spleenectomy      Current Medications: Current Meds  Medication Sig  . fluticasone (FLONASE) 50 MCG/ACT nasal spray Place 2 sprays into both nostrils at bedtime as needed for allergies or rhinitis.  Marland Kitchen omeprazole (PRILOSEC) 20 MG capsule Take 20 mg by mouth daily.     Allergies:   Penicillins   Social History   Socioeconomic History  . Marital status: Married    Spouse name: Not on file  . Number of children: Not on file  . Years of education: Not on file  . Highest education level: Not on file  Occupational History  . Not on file  Social Needs  . Financial resource strain: Not on file  . Food insecurity:    Worry: Not  on file    Inability: Not on file  . Transportation needs:    Medical: Not on file    Non-medical: Not on file  Tobacco Use  . Smoking status: Never Smoker  . Smokeless tobacco: Never Used  Substance and Sexual Activity  . Alcohol use: Yes    Alcohol/week: 6.0 - 7.0 standard drinks    Types: 6 - 7 Glasses of wine per week    Comment: wine nightly  . Drug use: No  . Sexual activity: Not Currently  Lifestyle  . Physical activity:    Days per week: Not on file    Minutes per session: Not on file  . Stress: Not on file  Relationships  . Social connections:    Talks on phone: Not on file    Gets together: Not on file    Attends religious service: Not on file    Active member of club or  organization: Not on file    Attends meetings of clubs or organizations: Not on file    Relationship status: Not on file  Other Topics Concern  . Not on file  Social History Narrative  . Not on file     Family History: The patient's family history includes Leukemia in his mother; Lung cancer in his father.  ROS:   Please see the history of present illness.    All other systems reviewed and are negative.  EKGs/Labs/Other Studies Reviewed:    The following studies were reviewed today: I discussed my findings with the patient at extensive length.  EKG reveals sinus rhythm and nonspecific ST-T changes.   Recent Labs: 09/09/2018: ALT 38; BUN 15; Creatinine, Ser 0.76; Hemoglobin 13.5; Platelets 417; Potassium 4.1; Sodium 136  Recent Lipid Panel No results found for: CHOL, TRIG, HDL, CHOLHDL, VLDL, LDLCALC, LDLDIRECT  Physical Exam:    VS:  BP 128/82 (BP Location: Right Arm, Patient Position: Sitting, Cuff Size: Normal)   Pulse 69   Ht 5\' 10"  (1.778 m)   Wt 207 lb (93.9 kg)   SpO2 96%   BMI 29.70 kg/m     Wt Readings from Last 3 Encounters:  09/15/18 207 lb (93.9 kg)  09/09/18 209 lb 11.2 oz (95.1 kg)  02/27/16 193 lb 4 oz (87.7 kg)     GEN: Patient is in no acute distress HEENT: Normal NECK: No JVD; No carotid bruits LYMPHATICS: No lymphadenopathy CARDIAC: S1 S2 regular, 2/6 systolic murmur at the apex. RESPIRATORY:  Clear to auscultation without rales, wheezing or rhonchi  ABDOMEN: Soft, non-tender, non-distended MUSCULOSKELETAL:  No edema; No deformity  SKIN: Warm and dry NEUROLOGIC:  Alert and oriented x 3 PSYCHIATRIC:  Normal affect    Signed, Jenean Lindau, MD  09/15/2018 10:56 AM    [place]

## 2018-09-15 NOTE — Addendum Note (Signed)
Addended by: Mattie Marlin on: 09/15/2018 11:05 AM   Modules accepted: Orders

## 2018-09-15 NOTE — Patient Instructions (Signed)
Medication Instructions:  Your physician recommends that you continue on your current medications as directed. Please refer to the Current Medication list given to you today.  If you need a refill on your cardiac medications before your next appointment, please call your pharmacy.   Lab work: None  If you have labs (blood work) drawn today and your tests are completely normal, you will receive your results only by: Marland Kitchen MyChart Message (if you have MyChart) OR . A paper copy in the mail If you have any lab test that is abnormal or we need to change your treatment, we will call you to review the results.  Testing/Procedures: Your physician has requested that you have an echocardiogram. Echocardiography is a painless test that uses sound waves to create images of your heart. It provides your doctor with information about the size and shape of your heart and how well your heart's chambers and valves are working. This procedure takes approximately one hour. There are no restrictions for this procedure.  Your physician has requested that you have a lexiscan myoview. For further information please visit [email or web address]. Please follow instruction sheet, as given.  Follow-Up: At [facility], you and your health needs are our priority.  As part of our continuing mission to provide you with exceptional heart care, we have created designated Provider Care Teams.  These Care Teams include your primary Cardiologist (physician) and Advanced Practice Providers (APPs -  Physician Assistants and Nurse Practitioners) who all work together to provide you with the care you need, when you need it.  You will need a follow up appointment in as needed.  Please call our office 2 months in advance to schedule this appointment.  You may see another member of our [employer] Provider Team in [place]: Jenne Campus, MD . Shirlee More, MD  Any Other Special Instructions Will Be Listed Below (If  Applicable).

## 2018-09-16 ENCOUNTER — Telehealth: Payer: Self-pay

## 2018-09-16 ENCOUNTER — Ambulatory Visit (HOSPITAL_BASED_OUTPATIENT_CLINIC_OR_DEPARTMENT_OTHER): Payer: Medicare Other

## 2018-09-16 ENCOUNTER — Other Ambulatory Visit (HOSPITAL_COMMUNITY): Payer: Medicare Other

## 2018-09-16 ENCOUNTER — Ambulatory Visit (HOSPITAL_COMMUNITY): Payer: Medicare Other | Attending: Cardiology

## 2018-09-16 VITALS — Ht 70.0 in | Wt 207.0 lb

## 2018-09-16 DIAGNOSIS — Z01810 Encounter for preprocedural cardiovascular examination: Secondary | ICD-10-CM | POA: Diagnosis present

## 2018-09-16 LAB — ECHOCARDIOGRAM COMPLETE
Height: 70 in
Weight: 3312 oz

## 2018-09-16 LAB — MYOCARDIAL PERFUSION IMAGING
CHL CUP NUCLEAR SSS: 2
LV dias vol: 96 mL (ref 62–150)
LV sys vol: 40 mL
Peak HR: 95 {beats}/min
Rest HR: 69 {beats}/min
SDS: 1
SRS: 1
TID: 0.97

## 2018-09-16 MED ORDER — TRANEXAMIC ACID-NACL 1000-0.7 MG/100ML-% IV SOLN
1000.0000 mg | INTRAVENOUS | Status: AC
  Administered 2018-09-19: 1000 mg via INTRAVENOUS
  Filled 2018-09-16: qty 100

## 2018-09-16 MED ORDER — TECHNETIUM TC 99M TETROFOSMIN IV KIT
10.3000 | PACK | Freq: Once | INTRAVENOUS | Status: AC | PRN
  Administered 2018-09-16: 10.3 via INTRAVENOUS
  Filled 2018-09-16: qty 11

## 2018-09-16 MED ORDER — BUPIVACAINE LIPOSOME 1.3 % IJ SUSP
20.0000 mL | Freq: Once | INTRAMUSCULAR | Status: DC
  Filled 2018-09-16 (×2): qty 20

## 2018-09-16 MED ORDER — REGADENOSON 0.4 MG/5ML IV SOLN
0.4000 mg | Freq: Once | INTRAVENOUS | Status: AC
  Administered 2018-09-16: 0.4 mg via INTRAVENOUS

## 2018-09-16 MED ORDER — TECHNETIUM TC 99M TETROFOSMIN IV KIT
32.8000 | PACK | Freq: Once | INTRAVENOUS | Status: AC | PRN
  Administered 2018-09-16: 32.8 via INTRAVENOUS
  Filled 2018-09-16: qty 33

## 2018-09-16 NOTE — Telephone Encounter (Signed)
-----   Message from Jenean Lindau, MD sent at 09/16/2018 12:20 PM EST ----- The results of the study is unremarkable. Please inform patient. I will discuss in detail at next appointment. Cc  primary care/referring physician Jenean Lindau, MD 09/16/2018 12:20 PM

## 2018-09-16 NOTE — Progress Notes (Signed)
Anesthesia follow-up:  See 09/09/18 PAT note by Willeen Cass, NP. Since then patient was seen by cardiologist Jyl Heinz, MD on 09/15/18 for preoperative evaluation and evaluation of murmur. He wrote, "Patient will have a echocardiogram to assess murmur heard on auscultation and a Lexiscan sestamibi.  If these tests are negative he is not at high risk for coronary events during the aforementioned surgery.  Meticulous hemodynamic monitoring will further reduce the risk of coronary events." Results of tests outlined below.  Echo 09/16/18: Study Conclusions - Left ventricle: The cavity size was normal. Wall thickness was   normal. Systolic function was normal. The estimated ejection   fraction was in the range of 60% to 65%. Wall motion was normal;   there were no regional wall motion abnormalities. Doppler   parameters are consistent with abnormal left ventricular   relaxation (grade 1 diastolic dysfunction). - Aortic valve: Trileaflet; moderately thickened, moderately   calcified leaflets. Valve mobility was restricted. Doppler:   Transvalvular velocity was within the normal range.There was no   stenosis. There was no regurgitation.  Mean gradient (S): 9 mm Hg.    Peak gradient (S): 16 mm Hg. 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 - Ascending aorta: The ascending aorta was mildly dilated. - Right ventricle: The cavity size was mildly dilated. Wall   thickness was normal. - Right atrium: The atrium was mildly dilated.  Nuclear stress test 09/16/18:  Nuclear stress EF: 59%.  There was no ST segment deviation noted during stress.  The study is normal.  This is a low risk study.  The left ventricular ejection fraction is normal (55-65%). Normal pharmacologic nuclear stress test with no evidence for prior infarct or ischemia.   George Hugh [facility]/Anesthesiology Phone [phone] 09/16/2018 4:08 PM

## 2018-09-16 NOTE — Telephone Encounter (Signed)
-----   Message from Jenean Lindau, MD sent at 09/16/2018 11:18 AM EST ----- The results of the study is unremarkable. Please inform patient. I will discuss in detail at next appointment. Cc  primary care/referring physician Jenean Lindau, MD 09/16/2018 11:18 AM

## 2018-09-16 NOTE — Telephone Encounter (Signed)
Patient called and notified of test results. 

## 2018-09-16 NOTE — Anesthesia Preprocedure Evaluation (Deleted)
Anesthesia Evaluation  Patient identified by MRN, date of birth, ID band Patient awake    Reviewed: Allergy & Precautions, H&P , NPO status , Patient's Chart, lab work & pertinent test results, reviewed documented beta blocker date and time   Airway Mallampati: II  TM Distance: >3 FB Neck ROM: full    Dental no notable dental hx.    Pulmonary sleep apnea and Continuous Positive Airway Pressure Ventilation ,    Pulmonary exam normal breath sounds clear to auscultation       Cardiovascular Exercise Tolerance: Good negative cardio ROS   Rhythm:regular Rate:Normal  Echo 19 - Left ventricle: The cavity size was normal. Wall thickness was normal. Systolic function was normal. The estimated ejection fraction was in the range of 60% to 65%. Wall motion was normal; there were no regional wall motion abnormalities. Doppler parameters are consistent with abnormal left ventricular relaxation (grade 1 diastolic dysfunction). - Aortic valve: Trileaflet; moderately thickened, moderately calcified leaflets. Valve mobility was restricted. Doppler:   Transvalvular velocity was within the normal range.There was no   stenosis. There was no regurgitation. Mean gradient (S): 9 mm Hg.    Peak gradient (S): 16 mm Hg. 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 - Ascending aorta: The ascending aorta was mildly dilated. - Right ventricle: The cavity size was mildly dilated. Wall thickness was normal. - Right atrium: The atrium was mildly dilated.  Nuclear stress test 09/16/18:  Nuclear stress EF: 59%.  There was no ST segment deviation noted during stress.  The study is normal.  This is a low risk study.  The left ventricular ejection fraction is normal (55-65%). Normal pharmacologic nuclear stress test with no evidence for prior infarct or ischemia.   Neuro/Psych negative neurological ROS  negative psych ROS    GI/Hepatic Neg liver ROS, GERD  Medicated,  Endo/Other  negative endocrine ROS  Renal/GU negative Renal ROS  negative genitourinary   Musculoskeletal  (+) Arthritis , Osteoarthritis,    Abdominal   Peds  Hematology negative hematology ROS (+)   Anesthesia Other Findings   Reproductive/Obstetrics negative OB ROS                                                              Anesthesia Evaluation  Patient identified by MRN, date of birth, ID band Patient awake    Reviewed: Allergy & Precautions, H&P , NPO status , Patient's Chart, lab work & pertinent test results  History of Anesthesia Complications Negative for: history of anesthetic complications  Airway Mallampati: IV  TM Distance: <3 FB Neck ROM: full   Comment: He does have a recessed chin and Mallampati view of 4, full neck range of motion and > 6cm thyromental distance Dental no notable dental hx. (+) Dental Advisory Given   Pulmonary neg pulmonary ROS,    Pulmonary exam normal breath sounds clear to auscultation       Cardiovascular negative cardio ROS Normal cardiovascular exam Rhythm:regular Rate:Normal     Neuro/Psych negative neurological ROS     GI/Hepatic Neg liver ROS, GERD  ,  Endo/Other  negative endocrine ROS  Renal/GU negative Renal ROS     Musculoskeletal  (+) Arthritis ,   Abdominal   Peds  Hematology negative hematology ROS (+)   Anesthesia  Other Findings   Reproductive/Obstetrics negative OB ROS                            Anesthesia Physical Anesthesia Plan  ASA: II  Anesthesia Plan: General   Post-op Pain Management:    Induction: Intravenous  Airway Management Planned: Oral ETT  Additional Equipment:   Intra-op Plan:   Post-operative Plan:   Informed Consent: I have reviewed the patients History and Physical, chart, labs and discussed the procedure including the risks, benefits and alternatives  for the proposed anesthesia with the patient or authorized representative who has indicated his/her understanding and acceptance.   Dental Advisory Given  Plan Discussed with: Anesthesiologist, CRNA and Surgeon  Anesthesia Plan Comments: (Will have glidescope available given recessed chin and mallampati IV grade)       Anesthesia Quick Evaluation                                   Anesthesia Evaluation  Patient identified by MRN, date of birth, ID band Patient awake    Reviewed: Allergy & Precautions, H&P , NPO status , Patient's Chart, lab work & pertinent test results  History of Anesthesia Complications Negative for: history of anesthetic complications  Airway Mallampati: IV  TM Distance: <3 FB Neck ROM: full   Comment: He does have a recessed chin and Mallampati view of 4, full neck range of motion and > 6cm thyromental distance Dental no notable dental hx. (+) Dental Advisory Given   Pulmonary neg pulmonary ROS,    Pulmonary exam normal breath sounds clear to auscultation       Cardiovascular negative cardio ROS Normal cardiovascular exam Rhythm:regular Rate:Normal     Neuro/Psych negative neurological ROS     GI/Hepatic Neg liver ROS, GERD  ,  Endo/Other  negative endocrine ROS  Renal/GU negative Renal ROS     Musculoskeletal  (+) Arthritis ,   Abdominal   Peds  Hematology negative hematology ROS (+)   Anesthesia Other Findings   Reproductive/Obstetrics negative OB ROS                            Anesthesia Physical Anesthesia Plan  ASA: II  Anesthesia Plan: General   Post-op Pain Management:    Induction: Intravenous  Airway Management Planned: Oral ETT  Additional Equipment:   Intra-op Plan:   Post-operative Plan:   Informed Consent: I have reviewed the patients History and Physical, chart, labs and discussed the procedure including the risks, benefits and alternatives for the proposed  anesthesia with the patient or authorized representative who has indicated his/her understanding and acceptance.   Dental Advisory Given  Plan Discussed with: Anesthesiologist, CRNA and Surgeon  Anesthesia Plan Comments: (Will have glidescope available given recessed chin and mallampati IV grade)       Anesthesia Quick Evaluation                                   Anesthesia Evaluation  Patient identified by MRN, date of birth, ID band Patient awake    Reviewed: Allergy & Precautions, H&P , NPO status , Patient's Chart, lab work & pertinent test results  History of Anesthesia Complications Negative for: history of anesthetic complications  Airway Mallampati: IV  TM Distance: <  3 FB Neck ROM: full   Comment: He does have a recessed chin and Mallampati view of 4, full neck range of motion and > 6cm thyromental distance Dental no notable dental hx. (+) Dental Advisory Given   Pulmonary neg pulmonary ROS,    Pulmonary exam normal breath sounds clear to auscultation       Cardiovascular negative cardio ROS Normal cardiovascular exam Rhythm:regular Rate:Normal     Neuro/Psych negative neurological ROS     GI/Hepatic Neg liver ROS, GERD  ,  Endo/Other  negative endocrine ROS  Renal/GU negative Renal ROS     Musculoskeletal  (+) Arthritis ,   Abdominal   Peds  Hematology negative hematology ROS (+)   Anesthesia Other Findings   Reproductive/Obstetrics negative OB ROS                            Anesthesia Physical Anesthesia Plan  ASA: II  Anesthesia Plan: General   Post-op Pain Management:    Induction: Intravenous  Airway Management Planned: Oral ETT  Additional Equipment:   Intra-op Plan:   Post-operative Plan:   Informed Consent: I have reviewed the patients History and Physical, chart, labs and discussed the procedure including the risks, benefits and alternatives for the proposed anesthesia with the  patient or authorized representative who has indicated his/her understanding and acceptance.   Dental Advisory Given  Plan Discussed with: Anesthesiologist, CRNA and Surgeon  Anesthesia Plan Comments: (Will have glidescope available given recessed chin and mallampati IV grade)       Anesthesia Quick Evaluation                                   Anesthesia Evaluation  Patient identified by MRN, date of birth, ID band Patient awake    Reviewed: Allergy & Precautions, H&P , NPO status , Patient's Chart, lab work & pertinent test results  History of Anesthesia Complications Negative for: history of anesthetic complications  Airway Mallampati: IV  TM Distance: <3 FB Neck ROM: full   Comment: He does have a recessed chin and Mallampati view of 4, full neck range of motion and > 6cm thyromental distance Dental no notable dental hx. (+) Dental Advisory Given   Pulmonary neg pulmonary ROS,    Pulmonary exam normal breath sounds clear to auscultation       Cardiovascular negative cardio ROS Normal cardiovascular exam Rhythm:regular Rate:Normal     Neuro/Psych negative neurological ROS     GI/Hepatic Neg liver ROS, GERD  ,  Endo/Other  negative endocrine ROS  Renal/GU negative Renal ROS     Musculoskeletal  (+) Arthritis ,   Abdominal   Peds  Hematology negative hematology ROS (+)   Anesthesia Other Findings   Reproductive/Obstetrics negative OB ROS                            Anesthesia Physical Anesthesia Plan  ASA: II  Anesthesia Plan: General   Post-op Pain Management:    Induction: Intravenous  Airway Management Planned: Oral ETT  Additional Equipment:   Intra-op Plan:   Post-operative Plan:   Informed Consent: I have reviewed the patients History and Physical, chart, labs and discussed the procedure including the risks, benefits and alternatives for the proposed anesthesia with the patient or authorized  representative who has indicated his/her understanding and  acceptance.   Dental Advisory Given  Plan Discussed with: Anesthesiologist, CRNA and Surgeon  Anesthesia Plan Comments: (Will have glidescope available given recessed chin and mallampati IV grade)       Anesthesia Quick Evaluation  Anesthesia Physical Anesthesia Plan  ASA: II  Anesthesia Plan: General   Post-op Pain Management: GA combined w/ Regional for post-op pain   Induction: Intravenous  PONV Risk Score and Plan:   Airway Management Planned: Oral ETT and Video Laryngoscope Planned  Additional Equipment:   Intra-op Plan:   Post-operative Plan: Extubation in OR  Informed Consent: I have reviewed the patients History and Physical, chart, labs and discussed the procedure including the risks, benefits and alternatives for the proposed anesthesia with the patient or authorized representative who has indicated his/her understanding and acceptance.   Dental Advisory Given  Plan Discussed with: CRNA, Anesthesiologist and Surgeon  Anesthesia Plan Comments: (PAT note written by Willeen Cass, NP with follow-up note by Myra Gianotti, PA-C.)       Anesthesia Quick Evaluation

## 2018-09-18 NOTE — Anesthesia Preprocedure Evaluation (Signed)
Anesthesia Evaluation  Patient identified by MRN, date of birth, ID band Patient awake    Reviewed: Allergy & Precautions, H&P , NPO status , Patient's Chart, lab work & pertinent test results  History of Anesthesia Complications Negative for: history of anesthetic complications  Airway Mallampati: IV  TM Distance: <3 FB Neck ROM: full   Comment: He does have a recessed chin and Mallampati view of 4, full neck range of motion and > 6cm thyromental distance Dental no notable dental hx. (+) Dental Advisory Given   Pulmonary neg pulmonary ROS,    Pulmonary exam normal breath sounds clear to auscultation       Cardiovascular negative cardio ROS Normal cardiovascular exam Rhythm:regular Rate:Normal  Echo 09/16/18:  - Left ventricle: Systolic function was normal. The estimated ejection fraction was in the range of 60% to 65%. Wall motion was normal; there were no regional wall motion abnormalities. Doppler parameters are consistent with abnormal left ventricular relaxation (grade 1 diastolic dysfunction). - Aortic valve: Trileaflet; moderately thickened, moderately calcified leaflets. Valve mobility was restricted. Doppler:   Transvalvular velocity was within the normal range.There was no   stenosis. There was no regurgitation. Mean gradient (S): 9 mm Hg.    Peak gradient (S): 16 mm Hg. 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 - Ascending aorta: The ascending aorta was mildly dilated. - Right ventricle: The cavity size was mildly dilated. Wall thickness was normal. - Right atrium: The atrium was mildly dilated.  Nuclear stress test 09/16/18:  Nuclear stress EF: 59%.  There was no ST segment deviation noted during stress.  The study is normal.  This is a low risk study.  The left ventricular ejection fraction is normal (55-65%). Normal pharmacologic nuclear stress test with no evidence for prior infarct or  ischemia.   Neuro/Psych negative neurological ROS     GI/Hepatic Neg liver ROS, GERD  ,  Endo/Other  negative endocrine ROS  Renal/GU negative Renal ROS     Musculoskeletal  (+) Arthritis ,   Abdominal   Peds  Hematology negative hematology ROS (+)   Anesthesia Other Findings   Reproductive/Obstetrics negative OB ROS                             Anesthesia Physical  Anesthesia Plan  ASA: III  Anesthesia Plan: General   Post-op Pain Management: GA combined w/ Regional for post-op pain   Induction: Intravenous  PONV Risk Score and Plan: 2 and Ondansetron and Treatment may vary due to age or medical condition  Airway Management Planned: Oral ETT, LMA and Video Laryngoscope Planned  Additional Equipment:   Intra-op Plan:   Post-operative Plan:   Informed Consent: I have reviewed the patients History and Physical, chart, labs and discussed the procedure including the risks, benefits and alternatives for the proposed anesthesia with the patient or authorized representative who has indicated his/her understanding and acceptance.   Dental Advisory Given  Plan Discussed with: Anesthesiologist, CRNA and Surgeon  Anesthesia Plan Comments: (Will have glidescope available given recessed chin and mallampati IV grade)        Anesthesia Quick Evaluation

## 2018-09-19 ENCOUNTER — Other Ambulatory Visit: Payer: Self-pay

## 2018-09-19 ENCOUNTER — Inpatient Hospital Stay (HOSPITAL_COMMUNITY)
Admission: RE | Admit: 2018-09-19 | Discharge: 2018-09-21 | DRG: 470 | Disposition: A | Discharge status: Home Health | Payer: Medicare Other | Attending: Orthopedic Surgery | Admitting: Orthopedic Surgery

## 2018-09-19 ENCOUNTER — Ambulatory Visit (HOSPITAL_COMMUNITY): Payer: Medicare Other | Admitting: Anesthesiology

## 2018-09-19 ENCOUNTER — Ambulatory Visit (HOSPITAL_COMMUNITY): Payer: Medicare Other | Admitting: Vascular Surgery

## 2018-09-19 ENCOUNTER — Encounter (HOSPITAL_COMMUNITY): Payer: Self-pay | Admitting: General Practice

## 2018-09-19 ENCOUNTER — Encounter (HOSPITAL_COMMUNITY)
Admission: RE | Disposition: A | Discharge status: Home Health | Payer: Self-pay | Source: Home / Self Care | Attending: Orthopedic Surgery

## 2018-09-19 DIAGNOSIS — E669 Obesity, unspecified: Secondary | ICD-10-CM | POA: Diagnosis present

## 2018-09-19 DIAGNOSIS — Z79899 Other long term (current) drug therapy: Secondary | ICD-10-CM

## 2018-09-19 DIAGNOSIS — K219 Gastro-esophageal reflux disease without esophagitis: Secondary | ICD-10-CM | POA: Diagnosis present

## 2018-09-19 DIAGNOSIS — Z880 Allergy status to penicillin: Secondary | ICD-10-CM

## 2018-09-19 DIAGNOSIS — I358 Other nonrheumatic aortic valve disorders: Secondary | ICD-10-CM | POA: Diagnosis present

## 2018-09-19 DIAGNOSIS — M21161 Varus deformity, not elsewhere classified, right knee: Secondary | ICD-10-CM | POA: Diagnosis present

## 2018-09-19 DIAGNOSIS — R42 Dizziness and giddiness: Secondary | ICD-10-CM | POA: Diagnosis not present

## 2018-09-19 DIAGNOSIS — Z9081 Acquired absence of spleen: Secondary | ICD-10-CM

## 2018-09-19 DIAGNOSIS — E785 Hyperlipidemia, unspecified: Secondary | ICD-10-CM | POA: Diagnosis present

## 2018-09-19 DIAGNOSIS — Z801 Family history of malignant neoplasm of trachea, bronchus and lung: Secondary | ICD-10-CM

## 2018-09-19 DIAGNOSIS — I359 Nonrheumatic aortic valve disorder, unspecified: Secondary | ICD-10-CM | POA: Diagnosis present

## 2018-09-19 DIAGNOSIS — M1711 Unilateral primary osteoarthritis, right knee: Secondary | ICD-10-CM | POA: Diagnosis present

## 2018-09-19 DIAGNOSIS — Z9049 Acquired absence of other specified parts of digestive tract: Secondary | ICD-10-CM

## 2018-09-19 DIAGNOSIS — M659 Synovitis and tenosynovitis, unspecified: Secondary | ICD-10-CM | POA: Diagnosis present

## 2018-09-19 DIAGNOSIS — Z6830 Body mass index (BMI) 30.0-30.9, adult: Secondary | ICD-10-CM

## 2018-09-19 DIAGNOSIS — Z87448 Personal history of other diseases of urinary system: Secondary | ICD-10-CM

## 2018-09-19 DIAGNOSIS — G4730 Sleep apnea, unspecified: Secondary | ICD-10-CM | POA: Diagnosis present

## 2018-09-19 DIAGNOSIS — M48062 Spinal stenosis, lumbar region with neurogenic claudication: Secondary | ICD-10-CM | POA: Diagnosis present

## 2018-09-19 DIAGNOSIS — Z806 Family history of leukemia: Secondary | ICD-10-CM

## 2018-09-19 DIAGNOSIS — D72829 Elevated white blood cell count, unspecified: Secondary | ICD-10-CM | POA: Diagnosis present

## 2018-09-19 DIAGNOSIS — M170 Bilateral primary osteoarthritis of knee: Principal | ICD-10-CM | POA: Diagnosis present

## 2018-09-19 HISTORY — PX: TOTAL KNEE ARTHROPLASTY: SHX125

## 2018-09-19 HISTORY — DX: Unilateral primary osteoarthritis, right knee: M17.11

## 2018-09-19 HISTORY — PX: INJECTION KNEE: SHX2446

## 2018-09-19 HISTORY — DX: Nonrheumatic aortic valve disorder, unspecified: I35.9

## 2018-09-19 SURGERY — ARTHROPLASTY, KNEE, TOTAL
Anesthesia: General | Laterality: Right

## 2018-09-19 MED ORDER — POTASSIUM CHLORIDE IN NACL 20-0.9 MEQ/L-% IV SOLN
INTRAVENOUS | Status: DC
  Administered 2018-09-19: 14:00:00 via INTRAVENOUS
  Filled 2018-09-19 (×2): qty 1000

## 2018-09-19 MED ORDER — DOCUSATE SODIUM 100 MG PO CAPS
100.0000 mg | ORAL_CAPSULE | Freq: Two times a day (BID) | ORAL | Status: DC
  Administered 2018-09-19 – 2018-09-21 (×5): 100 mg via ORAL
  Filled 2018-09-19 (×5): qty 1

## 2018-09-19 MED ORDER — ACETAMINOPHEN 500 MG PO TABS
ORAL_TABLET | ORAL | Status: AC
  Filled 2018-09-19: qty 2

## 2018-09-19 MED ORDER — ASPIRIN EC 325 MG PO TBEC
325.0000 mg | DELAYED_RELEASE_TABLET | Freq: Every day | ORAL | Status: DC
  Administered 2018-09-20 – 2018-09-21 (×2): 325 mg via ORAL
  Filled 2018-09-19 (×2): qty 1

## 2018-09-19 MED ORDER — METOCLOPRAMIDE HCL 5 MG PO TABS: 5.0000 mg | ORAL_TABLET | Freq: Three times a day (TID) | ORAL | Status: DC | PRN

## 2018-09-19 MED ORDER — LIDOCAINE 2% (20 MG/ML) 5 ML SYRINGE
INTRAMUSCULAR | Status: DC | PRN
  Administered 2018-09-19: 100 mg via INTRAVENOUS

## 2018-09-19 MED ORDER — METHYLPREDNISOLONE ACETATE 80 MG/ML IJ SUSP
INTRAMUSCULAR | Status: AC
  Filled 2018-09-19: qty 1

## 2018-09-19 MED ORDER — FENTANYL CITRATE (PF) 250 MCG/5ML IJ SOLN
INTRAMUSCULAR | Status: DC | PRN
  Administered 2018-09-19 (×3): 50 ug via INTRAVENOUS
  Administered 2018-09-19: 100 ug via INTRAVENOUS

## 2018-09-19 MED ORDER — HYDROMORPHONE HCL 1 MG/ML IJ SOLN
INTRAMUSCULAR | Status: DC | PRN
  Administered 2018-09-19: 0.5 mg via INTRAVENOUS

## 2018-09-19 MED ORDER — OXYCODONE HCL 5 MG PO TABS
5.0000 mg | ORAL_TABLET | ORAL | Status: DC | PRN
  Administered 2018-09-19: 5 mg via ORAL
  Administered 2018-09-19 – 2018-09-20 (×6): 10 mg via ORAL
  Administered 2018-09-21: 5 mg via ORAL
  Filled 2018-09-19 (×2): qty 2
  Filled 2018-09-19: qty 1
  Filled 2018-09-19 (×5): qty 2

## 2018-09-19 MED ORDER — ONDANSETRON HCL 4 MG/2ML IJ SOLN: 4.0000 mg | Freq: Four times a day (QID) | INTRAMUSCULAR | Status: DC | PRN

## 2018-09-19 MED ORDER — CHLORHEXIDINE GLUCONATE 4 % EX LIQD: 60.0000 mL | Freq: Once | CUTANEOUS | Status: DC

## 2018-09-19 MED ORDER — FLUTICASONE PROPIONATE 50 MCG/ACT NA SUSP
2.0000 | Freq: Every evening | NASAL | Status: DC | PRN
  Filled 2018-09-19: qty 16

## 2018-09-19 MED ORDER — CHLORHEXIDINE GLUCONATE CLOTH 2 % EX PADS: 6.0000 | MEDICATED_PAD | Freq: Every day | CUTANEOUS | Status: DC

## 2018-09-19 MED ORDER — VALACYCLOVIR HCL 500 MG PO TABS
1000.0000 mg | ORAL_TABLET | Freq: Every day | ORAL | Status: DC
  Filled 2018-09-19 (×2): qty 2

## 2018-09-19 MED ORDER — PROPOFOL 10 MG/ML IV BOLUS
INTRAVENOUS | Status: DC | PRN
  Administered 2018-09-19: 130 mg via INTRAVENOUS

## 2018-09-19 MED ORDER — DIPHENHYDRAMINE HCL 50 MG/ML IJ SOLN
INTRAMUSCULAR | Status: DC | PRN
  Administered 2018-09-19: 12.5 mg via INTRAVENOUS

## 2018-09-19 MED ORDER — DEXAMETHASONE SODIUM PHOSPHATE 10 MG/ML IJ SOLN
INTRAMUSCULAR | Status: DC | PRN
  Administered 2018-09-19: 10 mg via INTRAVENOUS

## 2018-09-19 MED ORDER — HYDROMORPHONE HCL 1 MG/ML IJ SOLN
0.5000 mg | INTRAMUSCULAR | Status: DC | PRN
  Administered 2018-09-19 – 2018-09-20 (×2): 1 mg via INTRAVENOUS
  Filled 2018-09-19 (×2): qty 1

## 2018-09-19 MED ORDER — CEFUROXIME SODIUM 750 MG IJ SOLR
INTRAMUSCULAR | Status: AC
  Filled 2018-09-19: qty 750

## 2018-09-19 MED ORDER — DEXAMETHASONE SODIUM PHOSPHATE 10 MG/ML IJ SOLN
10.0000 mg | Freq: Three times a day (TID) | INTRAMUSCULAR | Status: AC
  Administered 2018-09-19 – 2018-09-20 (×4): 10 mg via INTRAVENOUS
  Filled 2018-09-19 (×4): qty 1

## 2018-09-19 MED ORDER — PHENOL 1.4 % MT LIQD: 1.0000 | OROMUCOSAL | Status: DC | PRN

## 2018-09-19 MED ORDER — GABAPENTIN 300 MG PO CAPS
300.0000 mg | ORAL_CAPSULE | Freq: Every day | ORAL | Status: DC
  Administered 2018-09-19 – 2018-09-20 (×2): 300 mg via ORAL
  Filled 2018-09-19 (×2): qty 1

## 2018-09-19 MED ORDER — SODIUM CHLORIDE (PF) 0.9 % IJ SOLN
INTRAMUSCULAR | Status: DC | PRN
  Administered 2018-09-19: 50 mL

## 2018-09-19 MED ORDER — MIDAZOLAM HCL 5 MG/5ML IJ SOLN
INTRAMUSCULAR | Status: DC | PRN
  Administered 2018-09-19: 2 mg via INTRAVENOUS

## 2018-09-19 MED ORDER — BUPIVACAINE-EPINEPHRINE (PF) 0.25% -1:200000 IJ SOLN
INTRAMUSCULAR | Status: DC | PRN
  Administered 2018-09-19: 50 mL via PERINEURAL

## 2018-09-19 MED ORDER — SODIUM CHLORIDE 0.9 % IR SOLN
Status: DC | PRN
  Administered 2018-09-19: 3000 mL

## 2018-09-19 MED ORDER — POVIDONE-IODINE 7.5 % EX SOLN
Freq: Once | CUTANEOUS | Status: DC
  Filled 2018-09-19: qty 118

## 2018-09-19 MED ORDER — MENTHOL 3 MG MT LOZG: 1.0000 | LOZENGE | OROMUCOSAL | Status: DC | PRN

## 2018-09-19 MED ORDER — FENTANYL CITRATE (PF) 100 MCG/2ML IJ SOLN
INTRAMUSCULAR | Status: AC
  Filled 2018-09-19: qty 2

## 2018-09-19 MED ORDER — ROCURONIUM BROMIDE 50 MG/5ML IV SOSY
PREFILLED_SYRINGE | INTRAVENOUS | Status: DC | PRN
  Administered 2018-09-19: 50 mg via INTRAVENOUS

## 2018-09-19 MED ORDER — ALUM & MAG HYDROXIDE-SIMETH 200-200-20 MG/5ML PO SUSP: 30.0000 mL | ORAL | Status: DC | PRN

## 2018-09-19 MED ORDER — OXYCODONE HCL 5 MG PO TABS
ORAL_TABLET | ORAL | Status: AC
  Filled 2018-09-19: qty 2

## 2018-09-19 MED ORDER — ONDANSETRON HCL 4 MG/2ML IJ SOLN
INTRAMUSCULAR | Status: DC | PRN
  Administered 2018-09-19: 4 mg via INTRAVENOUS

## 2018-09-19 MED ORDER — BUPIVACAINE LIPOSOME 1.3 % IJ SUSP
INTRAMUSCULAR | Status: DC | PRN
  Administered 2018-09-19: 20 mL

## 2018-09-19 MED ORDER — LACTATED RINGERS IV SOLN
INTRAVENOUS | Status: DC
  Administered 2018-09-19 (×2): via INTRAVENOUS

## 2018-09-19 MED ORDER — CEFAZOLIN SODIUM-DEXTROSE 2-4 GM/100ML-% IV SOLN
INTRAVENOUS | Status: AC
  Filled 2018-09-19: qty 100

## 2018-09-19 MED ORDER — ONDANSETRON HCL 4 MG/2ML IJ SOLN
INTRAMUSCULAR | Status: AC
  Filled 2018-09-19: qty 2

## 2018-09-19 MED ORDER — CEFAZOLIN SODIUM-DEXTROSE 2-4 GM/100ML-% IV SOLN
2.0000 g | INTRAVENOUS | Status: AC
Start: 2018-09-19 — End: 2018-09-19
  Administered 2018-09-19: 2 g via INTRAVENOUS

## 2018-09-19 MED ORDER — MUPIROCIN 2 % EX OINT
TOPICAL_OINTMENT | Freq: Two times a day (BID) | CUTANEOUS | Status: DC
  Administered 2018-09-19 – 2018-09-21 (×5): via NASAL
  Filled 2018-09-19: qty 22

## 2018-09-19 MED ORDER — VANCOMYCIN HCL IN DEXTROSE 1-5 GM/200ML-% IV SOLN
1000.0000 mg | Freq: Once | INTRAVENOUS | Status: AC
  Administered 2018-09-19: 1000 mg via INTRAVENOUS
  Filled 2018-09-19: qty 200

## 2018-09-19 MED ORDER — METHYLPREDNISOLONE ACETATE 80 MG/ML IJ SUSP
INTRAMUSCULAR | Status: DC | PRN
  Administered 2018-09-19: 80 mg via INTRA_ARTICULAR

## 2018-09-19 MED ORDER — CEFUROXIME SODIUM 1.5 G IV SOLR
INTRAVENOUS | Status: DC | PRN
  Administered 2018-09-19: 1.5 g via INTRAVENOUS

## 2018-09-19 MED ORDER — HYDROMORPHONE HCL 1 MG/ML IJ SOLN
INTRAMUSCULAR | Status: AC
  Filled 2018-09-19: qty 0.5

## 2018-09-19 MED ORDER — BUPIVACAINE-EPINEPHRINE 0.25% -1:200000 IJ SOLN
INTRAMUSCULAR | Status: AC
  Filled 2018-09-19: qty 1

## 2018-09-19 MED ORDER — MEPERIDINE HCL 50 MG/ML IJ SOLN: 6.2500 mg | INTRAMUSCULAR | Status: DC | PRN

## 2018-09-19 MED ORDER — METOCLOPRAMIDE HCL 5 MG/ML IJ SOLN: 5.0000 mg | Freq: Three times a day (TID) | INTRAMUSCULAR | Status: DC | PRN

## 2018-09-19 MED ORDER — PROPOFOL 1000 MG/100ML IV EMUL
INTRAVENOUS | Status: AC
  Filled 2018-09-19: qty 100

## 2018-09-19 MED ORDER — FENTANYL CITRATE (PF) 250 MCG/5ML IJ SOLN
INTRAMUSCULAR | Status: AC
  Filled 2018-09-19: qty 5

## 2018-09-19 MED ORDER — CEFAZOLIN SODIUM-DEXTROSE 2-4 GM/100ML-% IV SOLN
2.0000 g | Freq: Four times a day (QID) | INTRAVENOUS | Status: AC
  Administered 2018-09-19 (×2): 2 g via INTRAVENOUS
  Filled 2018-09-19 (×2): qty 100

## 2018-09-19 MED ORDER — LIDOCAINE 2% (20 MG/ML) 5 ML SYRINGE
INTRAMUSCULAR | Status: AC
  Filled 2018-09-19: qty 5

## 2018-09-19 MED ORDER — MIDAZOLAM HCL 2 MG/2ML IJ SOLN
INTRAMUSCULAR | Status: AC
  Filled 2018-09-19: qty 2

## 2018-09-19 MED ORDER — PROPOFOL 1000 MG/100ML IV EMUL
INTRAVENOUS | Status: AC
  Filled 2018-09-19: qty 200

## 2018-09-19 MED ORDER — TRANEXAMIC ACID-NACL 1000-0.7 MG/100ML-% IV SOLN
1000.0000 mg | Freq: Once | INTRAVENOUS | Status: AC
  Administered 2018-09-19: 1000 mg via INTRAVENOUS
  Filled 2018-09-19: qty 100

## 2018-09-19 MED ORDER — SUGAMMADEX SODIUM 200 MG/2ML IV SOLN
INTRAVENOUS | Status: DC | PRN
  Administered 2018-09-19: 187.8 mg via INTRAVENOUS

## 2018-09-19 MED ORDER — ACETAMINOPHEN 500 MG PO TABS
1000.0000 mg | ORAL_TABLET | Freq: Four times a day (QID) | ORAL | Status: AC
  Administered 2018-09-19 – 2018-09-20 (×4): 1000 mg via ORAL
  Filled 2018-09-19 (×3): qty 2

## 2018-09-19 MED ORDER — VANCOMYCIN HCL IN DEXTROSE 1-5 GM/200ML-% IV SOLN
1000.0000 mg | Freq: Three times a day (TID) | INTRAVENOUS | Status: AC
  Administered 2018-09-19 – 2018-09-20 (×3): 1000 mg via INTRAVENOUS
  Filled 2018-09-19 (×3): qty 200

## 2018-09-19 MED ORDER — FENTANYL CITRATE (PF) 100 MCG/2ML IJ SOLN
25.0000 ug | INTRAMUSCULAR | Status: DC | PRN
  Administered 2018-09-19 (×3): 50 ug via INTRAVENOUS

## 2018-09-19 MED ORDER — 0.9 % SODIUM CHLORIDE (POUR BTL) OPTIME
TOPICAL | Status: DC | PRN
  Administered 2018-09-19: 1000 mL

## 2018-09-19 MED ORDER — DIPHENHYDRAMINE HCL 12.5 MG/5ML PO ELIX: 12.5000 mg | ORAL_SOLUTION | ORAL | Status: DC | PRN

## 2018-09-19 MED ORDER — PROPOFOL 10 MG/ML IV BOLUS
INTRAVENOUS | Status: AC
  Filled 2018-09-19: qty 20

## 2018-09-19 MED ORDER — ONDANSETRON HCL 4 MG PO TABS: 4.0000 mg | ORAL_TABLET | Freq: Four times a day (QID) | ORAL | Status: DC | PRN

## 2018-09-19 MED ORDER — SODIUM CHLORIDE 0.9 % IV SOLN
INTRAVENOUS | Status: DC | PRN
  Administered 2018-09-19: 25 ug/min via INTRAVENOUS

## 2018-09-19 MED ORDER — POLYETHYLENE GLYCOL 3350 17 G PO PACK
17.0000 g | PACK | Freq: Two times a day (BID) | ORAL | Status: DC
  Administered 2018-09-19 – 2018-09-20 (×3): 17 g via ORAL
  Filled 2018-09-19 (×5): qty 1

## 2018-09-19 MED ORDER — DEXAMETHASONE SODIUM PHOSPHATE 10 MG/ML IJ SOLN
INTRAMUSCULAR | Status: AC
  Filled 2018-09-19: qty 1

## 2018-09-19 MED ORDER — PHENYLEPHRINE 40 MCG/ML (10ML) SYRINGE FOR IV PUSH (FOR BLOOD PRESSURE SUPPORT)
PREFILLED_SYRINGE | INTRAVENOUS | Status: DC | PRN
  Administered 2018-09-19 (×5): 80 ug via INTRAVENOUS

## 2018-09-19 MED ORDER — DIPHENHYDRAMINE HCL 50 MG/ML IJ SOLN
INTRAMUSCULAR | Status: AC
  Filled 2018-09-19: qty 1

## 2018-09-19 MED ORDER — PANTOPRAZOLE SODIUM 40 MG PO TBEC
40.0000 mg | DELAYED_RELEASE_TABLET | Freq: Every day | ORAL | Status: DC
  Administered 2018-09-20: 40 mg via ORAL
  Filled 2018-09-19: qty 1

## 2018-09-19 SURGICAL SUPPLY — 85 items
APL SKNCLS STERI-STRIP NONHPOA (GAUZE/BANDAGES/DRESSINGS) ×2
ATTUNE PS FEM RT SZ 6 CEM KNEE (Femur) ×2 IMPLANT
ATTUNE PSRP INSR SZ6 8 KNEE (Insert) ×1 IMPLANT
ATTUNE PSRP INSR SZ6 8MM KNEE (Insert) ×1 IMPLANT
BANDAGE ESMARK 6X9 LF (GAUZE/BANDAGES/DRESSINGS) ×2 IMPLANT
BASE TIBIAL ROT PLAT SZ 7 KNEE (Knees) IMPLANT
BENZOIN TINCTURE PRP APPL 2/3 (GAUZE/BANDAGES/DRESSINGS) ×4 IMPLANT
BLADE SAGITTAL 25.0X1.19X90 (BLADE) ×3 IMPLANT
BLADE SAGITTAL 25.0X1.19X90MM (BLADE) ×1
BLADE SAW SGTL 13X75X1.27 (BLADE) ×4 IMPLANT
BLADE SURG 10 STRL SS (BLADE) ×8 IMPLANT
BNDG CMPR 9X6 STRL LF SNTH (GAUZE/BANDAGES/DRESSINGS) ×2
BNDG CMPR MED 15X6 ELC VLCR LF (GAUZE/BANDAGES/DRESSINGS) ×2
BNDG ELASTIC 6X15 VLCR STRL LF (GAUZE/BANDAGES/DRESSINGS) ×4 IMPLANT
BNDG ESMARK 6X9 LF (GAUZE/BANDAGES/DRESSINGS) ×4
BOWL SMART MIX CTS (DISPOSABLE) ×4 IMPLANT
BSPLAT TIB 7 CMNT ROT PLAT STR (Knees) ×2 IMPLANT
CEMENT HV SMART SET (Cement) ×8 IMPLANT
CLOSURE STERI-STRIP 1/4X4 (GAUZE/BANDAGES/DRESSINGS) ×2 IMPLANT
CLOSURE WOUND 1/2 X4 (GAUZE/BANDAGES/DRESSINGS) ×1
COVER SURGICAL LIGHT HANDLE (MISCELLANEOUS) ×4 IMPLANT
COVER WAND RF STERILE (DRAPES) ×4 IMPLANT
CUFF TOURNIQUET SINGLE 34IN LL (TOURNIQUET CUFF) ×4 IMPLANT
CUFF TOURNIQUET SINGLE 44IN (TOURNIQUET CUFF) IMPLANT
DECANTER SPIKE VIAL GLASS SM (MISCELLANEOUS) ×4 IMPLANT
DRAPE EXTREMITY T 121X128X90 (DRAPE) ×4 IMPLANT
DRAPE HALF SHEET 40X57 (DRAPES) ×8 IMPLANT
DRAPE INCISE IOBAN 66X45 STRL (DRAPES) IMPLANT
DRAPE ORTHO SPLIT 77X108 STRL (DRAPES) ×4
DRAPE SURG ORHT 6 SPLT 77X108 (DRAPES) ×2 IMPLANT
DRAPE U-SHAPE 47X51 STRL (DRAPES) ×4 IMPLANT
DRSG AQUACEL AG ADV 3.5X10 (GAUZE/BANDAGES/DRESSINGS) ×4 IMPLANT
DURAPREP 26ML APPLICATOR (WOUND CARE) ×6 IMPLANT
ELECT CAUTERY BLADE 6.4 (BLADE) ×4 IMPLANT
ELECT REM PT RETURN 9FT ADLT (ELECTROSURGICAL) ×4
ELECTRODE REM PT RTRN 9FT ADLT (ELECTROSURGICAL) ×2 IMPLANT
FACESHIELD WRAPAROUND (MASK) ×4 IMPLANT
FACESHIELD WRAPAROUND OR TEAM (MASK) ×2 IMPLANT
GLOVE BIO SURGEON STRL SZ 6.5 (GLOVE) ×3 IMPLANT
GLOVE BIO SURGEON STRL SZ7 (GLOVE) ×4 IMPLANT
GLOVE BIO SURGEONS STRL SZ 6.5 (GLOVE) ×3
GLOVE BIOGEL M 7.0 STRL (GLOVE) ×6 IMPLANT
GLOVE BIOGEL PI IND STRL 7.0 (GLOVE) ×2 IMPLANT
GLOVE BIOGEL PI IND STRL 7.5 (GLOVE) ×2 IMPLANT
GLOVE BIOGEL PI INDICATOR 7.0 (GLOVE) ×2
GLOVE BIOGEL PI INDICATOR 7.5 (GLOVE) ×2
GLOVE SS BIOGEL STRL SZ 7.5 (GLOVE) ×2 IMPLANT
GLOVE SUPERSENSE BIOGEL SZ 7.5 (GLOVE) ×2
GOWN STRL REUS W/ TWL LRG LVL3 (GOWN DISPOSABLE) ×2 IMPLANT
GOWN STRL REUS W/ TWL XL LVL3 (GOWN DISPOSABLE) ×2 IMPLANT
GOWN STRL REUS W/TWL LRG LVL3 (GOWN DISPOSABLE) ×12
GOWN STRL REUS W/TWL XL LVL3 (GOWN DISPOSABLE) ×4
HANDPIECE INTERPULSE COAX TIP (DISPOSABLE) ×4
HOOD PEEL AWAY FACE SHEILD DIS (HOOD) ×10 IMPLANT
IMMOBILIZER KNEE 22 (SOFTGOODS) ×2 IMPLANT
IMMOBILIZER KNEE 22 UNIV (SOFTGOODS) ×4 IMPLANT
KIT BASIN OR (CUSTOM PROCEDURE TRAY) ×4 IMPLANT
KIT TURNOVER KIT B (KITS) ×4 IMPLANT
MANIFOLD NEPTUNE II (INSTRUMENTS) ×4 IMPLANT
MARKER SKIN DUAL TIP RULER LAB (MISCELLANEOUS) ×4 IMPLANT
NEEDLE HYPO 22GX1.5 SAFETY (NEEDLE) ×8 IMPLANT
NS IRRIG 1000ML POUR BTL (IV SOLUTION) ×4 IMPLANT
PACK TOTAL JOINT (CUSTOM PROCEDURE TRAY) ×4 IMPLANT
PAD ARMBOARD 7.5X6 YLW CONV (MISCELLANEOUS) ×8 IMPLANT
PATELLA MEDIAL ATTUN 35MM KNEE (Knees) ×2 IMPLANT
PIN STEINMAN FIXATION KNEE (PIN) ×2 IMPLANT
PIN THREADED HEADED SIGMA (PIN) ×2 IMPLANT
SET HNDPC FAN SPRY TIP SCT (DISPOSABLE) ×2 IMPLANT
STRIP CLOSURE SKIN 1/2X4 (GAUZE/BANDAGES/DRESSINGS) ×3 IMPLANT
SUCTION FRAZIER HANDLE 10FR (MISCELLANEOUS) ×2
SUCTION TUBE FRAZIER 10FR DISP (MISCELLANEOUS) ×2 IMPLANT
SUT MNCRL AB 3-0 PS2 18 (SUTURE) ×4 IMPLANT
SUT VIC AB 0 CT1 27 (SUTURE) ×8
SUT VIC AB 0 CT1 27XBRD ANBCTR (SUTURE) ×4 IMPLANT
SUT VIC AB 1 CT1 27 (SUTURE) ×4
SUT VIC AB 1 CT1 27XBRD ANBCTR (SUTURE) ×2 IMPLANT
SUT VIC AB 2-0 CT1 27 (SUTURE) ×8
SUT VIC AB 2-0 CT1 TAPERPNT 27 (SUTURE) ×4 IMPLANT
SYR CONTROL 10ML LL (SYRINGE) ×8 IMPLANT
TIBIAL BASE ROT PLAT SZ 7 KNEE (Knees) ×4 IMPLANT
TOWEL OR 17X24 6PK STRL BLUE (TOWEL DISPOSABLE) ×4 IMPLANT
TOWEL OR 17X26 10 PK STRL BLUE (TOWEL DISPOSABLE) ×4 IMPLANT
TRAY CATH 16FR W/PLASTIC CATH (SET/KITS/TRAYS/PACK) ×2 IMPLANT
TRAY FOLEY CATH SILVER 16FR (SET/KITS/TRAYS/PACK) ×4 IMPLANT
WATER STERILE IRR 1000ML POUR (IV SOLUTION) ×4 IMPLANT

## 2018-09-19 NOTE — Progress Notes (Signed)
Orthopedic Tech Progress Note Patient Details:  Jimmy Rangel 01/01/1946 [id number]  Patient ID: [name], male   DOB: Mar 18, 1946, 72 y.o.   MRN: [id number]  I called RN. She says pt already has CPM and knee immobilizer.  Karolee Stamps 09/19/2018, 1:19 PM

## 2018-09-19 NOTE — Anesthesia Procedure Notes (Addendum)
Anesthesia Regional Block: Adductor canal block   Pre-Anesthetic Checklist: ,, timeout performed, Correct Patient, Correct Site, Correct Laterality, Correct Procedure, Correct Position, site marked, Risks and benefits discussed,  Surgical consent,  Pre-op evaluation,  At surgeon's request and post-op pain management  Laterality: Right  Prep: chloraprep       Needles:  Injection technique: Single-shot  Needle Type: Echogenic Stimulator Needle     Needle Length: 5cm  Needle Gauge: 22     Additional Needles:   Procedures:, nerve stimulator,,, ultrasound used (permanent image in chart),,,,  Narrative:  Start time: 09/19/2018 6:50 AM End time: 09/19/2018 7:00 AM Injection made incrementally with aspirations every 5 mL.  Performed by: Personally  Anesthesiologist: Janeece Riggers, MD  Additional Notes: Functioning IV was confirmed and monitors were applied.  A 67mm 22ga Arrow echogenic stimulator needle was used. Sterile prep and drape,hand hygiene and sterile gloves were used. Ultrasound guidance: relevant anatomy identified, needle position confirmed, local anesthetic spread visualized around nerve(s)., vascular puncture avoided.  Image printed for medical record. Negative aspiration and negative test dose prior to incremental administration of local anesthetic. The patient tolerated the procedure well.

## 2018-09-19 NOTE — Evaluation (Signed)
Physical Therapy Evaluation Patient Details Name: Jimmy Rangel MRN: [id number] DOB: 05/22/1946 Today's Date: 09/19/2018   History of Present Illness  Admitted for R TKA, L knee injection; WBAT, KI (can dc KI when pt demonstrates good quad control);  has a past medical history of Arthritis, Chronic back pain, GERD (gastroesophageal reflux disease), Headache, History of bronchitis as a child, Hyperlipidemia, Primary localized osteoarthritis of right knee, Sleep apnea, and Weakness.  Clinical Impression   Pt is s/p TKA resulting in the deficits listed below (see PT Problem List). Presents with decr functional mobility post TKA; Pt will benefit from skilled PT to increase their independence and safety with mobility to allow discharge to the venue listed below.      Follow Up Recommendations Follow surgeon's recommendation for DC plan and follow-up therapies;Supervision/Assistance - 24 hour    Equipment Recommendations  None recommended by PT    Recommendations for Other Services       Precautions / Restrictions Precautions Precautions: Knee Precaution Booklet Issued: Yes (comment) Required Braces or Orthoses: Knee Immobilizer - Right Knee Immobilizer - Right: (dc when good quad control) Restrictions Weight Bearing Restrictions: Yes RLE Weight Bearing: Weight bearing as tolerated LLE Weight Bearing: Weight bearing as tolerated Other Position/Activity Restrictions: `      Mobility  Bed Mobility Overal bed mobility: Needs Assistance Bed Mobility: Supine to Sit     Supine to sit: Min guard     General bed mobility comments: Cues for technqiue; used rails; slow moving  Transfers Overall transfer level: Needs assistance Equipment used: Rolling walker (2 wheeled) Transfers: Sit to/from Stand Sit to Stand: Min guard         General transfer comment: Cues for techqnieu and minguard for safety with moving hands from bed to jRW  Ambulation/Gait Ambulation/Gait assistance:  Min Web designer (Feet): 5 Feet Assistive device: Rolling walker (2 wheeled) Gait Pattern/deviations: Decreased step length - right;Decreased step length - left     General Gait Details: Cues to activate R quad for stance stability  Stairs            Wheelchair Mobility    Modified Rankin (Stroke Patients Only)       Balance                                             Pertinent Vitals/Pain Pain Assessment: Faces Faces Pain Scale: Hurts little more Pain Location: R knee Pain Descriptors / Indicators: Grimacing;Discomfort Pain Intervention(s): Monitored during session    Home Living Family/patient expects to be discharged to:: Private residence Living Arrangements: Spouse/significant other Available Help at Discharge: Family;Available 24 hours/day Type of Home: House Home Access: Stairs to enter Entrance Stairs-Rails: Right Entrance Stairs-Number of Steps: 3 Home Layout: Two level;Able to live on main level with bedroom/bathroom Home Equipment: Gilford Rile - 2 wheels;Bedside commode      Prior Function Level of Independence: Independent               Hand Dominance        Extremity/Trunk Assessment   Upper Extremity Assessment Upper Extremity Assessment: Overall WFL for tasks assessed    Lower Extremity Assessment Lower Extremity Assessment: RLE deficits/detail RLE Deficits / Details: Grossly decr AROM and strength, postop; range about 1-75 deg       Communication   Communication: No difficulties  Cognition Arousal/Alertness: Awake/alert Behavior During  Therapy: WFL for tasks assessed/performed Overall Cognitive Status: Within Functional Limits for tasks assessed                                        General Comments General comments (skin integrity, edema, etc.): Session conducted on Room air; O2 sats remained greater tahn or equal to 93%    Exercises     Assessment/Plan    PT Assessment  Patient needs continued PT services  PT Problem List Decreased strength;Decreased range of motion;Decreased activity tolerance;Decreased balance;Decreased knowledge of use of DME;Decreased knowledge of precautions;Pain       PT Treatment Interventions DME instruction;Gait training;Stair training;Functional mobility training;Therapeutic activities;Therapeutic exercise;Balance training;Patient/family education    PT Goals (Current goals can be found in the Care Plan section)  Acute Rehab PT Goals Patient Stated Goal: back to golf PT Goal Formulation: With patient Time For Goal Achievement: 10/03/18 Potential to Achieve Goals: Good    Frequency 7X/week   Barriers to discharge        Co-evaluation               AM-PAC PT "6 Clicks" Mobility  Outcome Measure Help needed turning from your back to your side while in a flat bed without using bedrails?: None Help needed moving from lying on your back to sitting on the side of a flat bed without using bedrails?: A Little Help needed moving to and from a bed to a chair (including a wheelchair)?: A Little Help needed standing up from a chair using your arms (e.g., wheelchair or bedside chair)?: A Little Help needed to walk in hospital room?: A Little Help needed climbing 3-5 steps with a railing? : A Little 6 Click Score: 19    End of Session Equipment Utilized During Treatment: Gait belt Activity Tolerance: Patient tolerated treatment well Patient left: in chair;with call bell/phone within reach Nurse Communication: Mobility status PT Visit Diagnosis: Unsteadiness on feet (R26.81);Other abnormalities of gait and mobility (R26.89);Pain Pain - Right/Left: Right Pain - part of body: Knee    Time: 1610-9604 PT Time Calculation (min) (ACUTE ONLY): 30 min   Charges:   PT Evaluation $PT Eval Moderate Complexity: 1 Mod PT Treatments $Therapeutic Activity: 8-22 mins        Roney Marion, PT  Acute Rehabilitation  Services Pager [phone] Office [phone]   Colletta [place] 09/19/2018, 4:38 PM

## 2018-09-19 NOTE — Progress Notes (Signed)
Orthopedic Tech Progress Note Patient Details:  Jimmy Rangel 1946-06-06 [id number]  CPM Right Knee CPM Right Knee: On Right Knee Flexion (Degrees): 90 Right Knee Extension (Degrees): 0 Additional Comments: foot roll  Post Interventions Patient Tolerated: Well Instructions Provided: Care of device  [name] 09/19/2018, 10:13 AM

## 2018-09-19 NOTE — Progress Notes (Signed)
Patietn refused CPAP HS tonight. RT instructed patient to call if he changed his mind.

## 2018-09-19 NOTE — Interval H&P Note (Signed)
History and Physical Interval Note:  09/19/2018 7:07 AM  Jimmy Rangel  has presented today for surgery, with the diagnosis of djd right knee  The various methods of treatment have been discussed with the patient and family. After consideration of risks, benefits and other options for treatment, the patient has consented to  Procedure(s): TOTAL KNEE ARTHROPLASTY (Right) LEFT KNEE INJECTION (Left) as a surgical intervention .  The patient's history has been reviewed, patient examined, no change in status, stable for surgery.  I have reviewed the patient's chart and labs.  Questions were answered to the patient's satisfaction.     Lorn Junes

## 2018-09-19 NOTE — Anesthesia Procedure Notes (Signed)
Procedure Name: Intubation Date/Time: 09/19/2018 7:27 AM Performed by: Renato Shin, CRNA Pre-anesthesia Checklist: Patient identified, Emergency Drugs available, Suction available and Patient being monitored Patient Re-evaluated:Patient Re-evaluated prior to induction Oxygen Delivery Method: Circle system utilized Preoxygenation: Pre-oxygenation with 100% oxygen Induction Type: IV induction Ventilation: Mask ventilation without difficulty Laryngoscope Size: Glidescope and 4 Grade View: Grade I Tube type: Oral Tube size: 7.5 mm Number of attempts: 1 Airway Equipment and Method: Rigid stylet and Video-laryngoscopy Placement Confirmation: ETT inserted through vocal cords under direct vision,  positive ETCO2 and breath sounds checked- equal and bilateral Secured at: 23 cm Tube secured with: Tape Dental Injury: Teeth and Oropharynx as per pre-operative assessment

## 2018-09-19 NOTE — Transfer of Care (Signed)
Immediate Anesthesia Transfer of Care Note  Patient: Jimmy Rangel  Procedure(s) Performed: TOTAL KNEE ARTHROPLASTY (Right ) LEFT KNEE INJECTION (Left )  Patient Location: PACU  Anesthesia Type:GA combined with regional for post-op pain  Level of Consciousness: awake, drowsy and patient cooperative  Airway & Oxygen Therapy: Patient Spontanous Breathing and Patient connected to nasal cannula oxygen  Post-op Assessment: Report given to RN, Post -op Vital signs reviewed and stable and Patient moving all extremities X 4  Post vital signs: Reviewed and stable  Last Vitals:  Vitals Value Taken Time  BP 154/92 09/19/2018  9:35 AM  Temp 36.5 C 09/19/2018  9:35 AM  Pulse 98 09/19/2018  9:35 AM  Resp 10 09/19/2018  9:35 AM  SpO2 98 % 09/19/2018  9:35 AM  Vitals shown include unvalidated device data.  Last Pain:  Vitals:   09/19/18 0649  TempSrc:   PainSc: 0-No pain      Patients Stated Pain Goal: 3 (74/71/59 5396)  Complications: No apparent anesthesia complications

## 2018-09-19 NOTE — Anesthesia Postprocedure Evaluation (Signed)
Anesthesia Post Note  Patient: Jimmy Rangel  Procedure(s) Performed: TOTAL KNEE ARTHROPLASTY (Right ) LEFT KNEE INJECTION (Left )     Patient location during evaluation: PACU Anesthesia Type: General Level of consciousness: awake and alert Pain management: pain level controlled Vital Signs Assessment: post-procedure vital signs reviewed and stable Respiratory status: spontaneous breathing, nonlabored ventilation, respiratory function stable and patient connected to nasal cannula oxygen Cardiovascular status: blood pressure returned to baseline and stable Postop Assessment: no apparent nausea or vomiting Anesthetic complications: no    Last Vitals:  Vitals:   09/19/18 1250 09/19/18 1309  BP: (!) 142/91 (!) 147/91  Pulse: 99 100  Resp: 10   Temp: (!) 36.3 C   SpO2: 96% 97%    Last Pain:  Vitals:   09/19/18 1309  TempSrc:   PainSc: 3                  Lorin Gawron

## 2018-09-19 NOTE — Op Note (Signed)
MRN:     [id number] DOB/AGE:    07/04/1946 / 72 y.o.       OPERATIVE REPORT   DATE OF PROCEDURE:  09/19/2018      PREOPERATIVE DIAGNOSIS:   Primary Localized Osteoarthritis right Knee       Estimated body mass index is 29.7 kg/m as calculated from the following:   Height as of this encounter: 5\' 10"  (1.778 m).   Weight as of this encounter: 93.9 kg.                                                       POSTOPERATIVE DIAGNOSIS:   Same plus primary localized OA left knee                                                                 PROCEDURE:  Procedure(s): TOTAL KNEE ARTHROPLASTY RIGHT KNEE INJECTION Using Depuy Attune RP implants #6 Femur, #7Tibia, 54mm  RP bearing, 35 Patella Left knee cortisone injecton    SURGEON: Belisa Eichholz A. Noemi Chapel, MD   ASSISTANT: Matthew Saras, PA-C, present and scrubbed throughout the case, critical for retraction, instrumentation, and closure.  ANESTHESIA: GET with Adductor Nerve Block  TOURNIQUET TIME: 58 minutes   COMPLICATIONS:  None       SPECIMENS: None   INDICATIONS FOR PROCEDURE: The patient has djd of the knee with varus deformities, XR shows bone on bone arthritis. Patient has failed all conservative measures including anti-inflammatory medicines, narcotics, attempts at exercise and weight loss, cortisone injections and viscosupplementation.  Risks and benefits of surgery have been discussed, questions answered.    DESCRIPTION OF PROCEDURE: The patient identified by armband, received right adductor canal block and IV antibiotics, in [facility] holding [facility]. Patient taken to the operating room, appropriate anesthetic monitors were attached. GET anesthesia induced with the patient in supine position, Foley catheter was inserted. The left knee was then sterily injected with 80mg  depo and 1cc of marcaine. Tourniquet applied high to the operative thigh. Lateral post and foot positioner applied to the table, the lower extremity was then  prepped and draped in usual sterile fashion from the ankle to the tourniquet. Time-out procedure was performed. The limb was wrapped with an Esmarch bandage and the tourniquet inflated to 365 mmHg.   We began the operation by making a 6cm anterior midline incision. Small bleeders in the skin and the subcutaneous tissue identified and cauterized. Transverse retinaculum was incised and reflected medially and a medial parapatellar arthrotomy was accomplished. the patella was everted and theprepatellar fat pad resected. The superficial medial collateral ligament was then elevated from anterior to posterior along the proximal flare of the tibia and anterior half of the menisci resected. The knee was hyperflexed exposing bone on bone arthritis. Peripheral and notch osteophytes as well as the cruciate ligaments were then resected. We continued to work our way around posteriorly along the proximal tibia, and externally rotated the tibia subluxing it out from underneath the femur. A McHale retractor was placed through the notch and a lateral Hohmann retractor placed, and an external tibial guide was placed.  The tibial cutting guide was pinned into place allowing resection of 4 mm of bone medially and about 6 mm of bone laterally because of her varus deformity.   Satisfied with the tibial resection, we then entered the distal femur 2 mm anterior to the PCL origin with the intramedullary guide rod and applied the distal femoral cutting guide set at 67mm, with 5 degrees of valgus. This was pinned along the epicondylar axis. At this point, the distal femoral cut was accomplished without difficulty. We then sized for a 6 femoral component and pinned the guide in 3 degrees of external rotation.The chamfer cutting guide was pinned into place. The anterior, posterior, and chamfer cuts were accomplished without difficulty followed by the  RP box cutting guide and the box cut. We also removed posterior osteophytes from the  posterior femoral condyles. At this time, the knee was brought into full extension. We checked our extension and flexion gaps and found them symmetric at 8.  The patella thickness measured at 8m m. We set the cutting guide at 15 and removed the posterior patella sized for 35 button and drilled the lollipop. The knee was then once again hyperflexed exposing the proximal tibia. We sized for a # 7 tibial base plate, applied the smokestack and the conical reamer followed by the the Delta fin keel punch. We then hammered into place the  RP trial femoral component, inserted a trial bearing, trial patellar button, and took the knee through range of motion from 0-130 degrees. No thumb pressure was required for patellar tracking.   At this point, all trial components were removed, a double batch of DePuy HV cement  with Zinacef 1.5gms was mixed and applied to all bony metallic mating surfaces. In order, we hammered into place the tibial tray and removed excess cement, the femoral component and removed excess cement, a 8 mm  RP bearing was inserted, and the knee brought to full extension with compression. The patellar button was clamped into place, and excess cement removed. While the cement cured the wound was irrigated out with normal saline solution pulse lavage, and exparel was injected throughout the knee. Ligament stability and patellar tracking were checked and found to be excellent..   The parapatellar arthrotomy was closed with  #1 Vicryl suture. The subcutaneous tissue with 0 and 2-0 undyed Vicryl suture, and 4-0 Monocryl.. A dressing of Aquaseal, 4 x 4, dressing sponges, Webril, and Ace wrap applied. Needle and sponge count were correct times 2.The patient awakened, extubated, and taken to recovery room without difficulty. Vascular status was normal, pulses 2+ and symmetric.    Lorn Junes 01/03/2018, 8:56 AM

## 2018-09-20 ENCOUNTER — Encounter (HOSPITAL_COMMUNITY): Payer: Self-pay | Admitting: Physician Assistant

## 2018-09-20 DIAGNOSIS — Z806 Family history of leukemia: Secondary | ICD-10-CM | POA: Diagnosis not present

## 2018-09-20 DIAGNOSIS — Z9049 Acquired absence of other specified parts of digestive tract: Secondary | ICD-10-CM | POA: Diagnosis not present

## 2018-09-20 DIAGNOSIS — I359 Nonrheumatic aortic valve disorder, unspecified: Secondary | ICD-10-CM

## 2018-09-20 DIAGNOSIS — M170 Bilateral primary osteoarthritis of knee: Secondary | ICD-10-CM | POA: Diagnosis present

## 2018-09-20 DIAGNOSIS — Z801 Family history of malignant neoplasm of trachea, bronchus and lung: Secondary | ICD-10-CM | POA: Diagnosis not present

## 2018-09-20 DIAGNOSIS — E669 Obesity, unspecified: Secondary | ICD-10-CM | POA: Diagnosis present

## 2018-09-20 DIAGNOSIS — R42 Dizziness and giddiness: Secondary | ICD-10-CM | POA: Diagnosis not present

## 2018-09-20 DIAGNOSIS — Z79899 Other long term (current) drug therapy: Secondary | ICD-10-CM | POA: Diagnosis not present

## 2018-09-20 DIAGNOSIS — Z87448 Personal history of other diseases of urinary system: Secondary | ICD-10-CM | POA: Diagnosis not present

## 2018-09-20 DIAGNOSIS — I358 Other nonrheumatic aortic valve disorders: Secondary | ICD-10-CM | POA: Diagnosis present

## 2018-09-20 DIAGNOSIS — Z9081 Acquired absence of spleen: Secondary | ICD-10-CM | POA: Diagnosis not present

## 2018-09-20 DIAGNOSIS — M25561 Pain in right knee: Secondary | ICD-10-CM | POA: Diagnosis present

## 2018-09-20 DIAGNOSIS — E785 Hyperlipidemia, unspecified: Secondary | ICD-10-CM | POA: Diagnosis present

## 2018-09-20 DIAGNOSIS — M21161 Varus deformity, not elsewhere classified, right knee: Secondary | ICD-10-CM | POA: Diagnosis present

## 2018-09-20 DIAGNOSIS — Z880 Allergy status to penicillin: Secondary | ICD-10-CM | POA: Diagnosis not present

## 2018-09-20 DIAGNOSIS — M659 Synovitis and tenosynovitis, unspecified: Secondary | ICD-10-CM | POA: Diagnosis present

## 2018-09-20 DIAGNOSIS — G4730 Sleep apnea, unspecified: Secondary | ICD-10-CM | POA: Diagnosis present

## 2018-09-20 DIAGNOSIS — D72829 Elevated white blood cell count, unspecified: Secondary | ICD-10-CM | POA: Diagnosis present

## 2018-09-20 DIAGNOSIS — M48062 Spinal stenosis, lumbar region with neurogenic claudication: Secondary | ICD-10-CM | POA: Diagnosis present

## 2018-09-20 DIAGNOSIS — K219 Gastro-esophageal reflux disease without esophagitis: Secondary | ICD-10-CM | POA: Diagnosis present

## 2018-09-20 DIAGNOSIS — Z6830 Body mass index (BMI) 30.0-30.9, adult: Secondary | ICD-10-CM | POA: Diagnosis not present

## 2018-09-20 HISTORY — DX: Nonrheumatic aortic valve disorder, unspecified: I35.9

## 2018-09-20 LAB — CBC
HCT: 36.5 % — ABNORMAL LOW (ref 39.0–52.0)
Hemoglobin: 12.1 g/dL — ABNORMAL LOW (ref 13.0–17.0)
MCH: 31.8 pg (ref 26.0–34.0)
MCHC: 33.2 g/dL (ref 30.0–36.0)
MCV: 95.8 fL (ref 80.0–100.0)
PLATELETS: 367 10*3/uL (ref 150–400)
RBC: 3.81 MIL/uL — ABNORMAL LOW (ref 4.22–5.81)
RDW: 13.2 % (ref 11.5–15.5)
WBC: 14.9 10*3/uL — ABNORMAL HIGH (ref 4.0–10.5)
nRBC: 0 % (ref 0.0–0.2)

## 2018-09-20 LAB — BASIC METABOLIC PANEL
Anion gap: 10 (ref 5–15)
BUN: 13 mg/dL (ref 8–23)
CO2: 24 mmol/L (ref 22–32)
Calcium: 8.9 mg/dL (ref 8.9–10.3)
Chloride: 103 mmol/L (ref 98–111)
Creatinine, Ser: 0.87 mg/dL (ref 0.61–1.24)
GFR calc Af Amer: >60 mL/min (ref >60)
GFR calc non Af Amer: >60 mL/min (ref >60)
Glucose, Bld: 178 mg/dL — ABNORMAL HIGH (ref 70–99)
Potassium: 4.4 mmol/L (ref 3.5–5.1)
Sodium: 137 mmol/L (ref 135–145)

## 2018-09-20 MED ORDER — CEFAZOLIN SODIUM-DEXTROSE 2-4 GM/100ML-% IV SOLN
2.0000 g | Freq: Three times a day (TID) | INTRAVENOUS | Status: DC
  Administered 2018-09-20 – 2018-09-21 (×4): 2 g via INTRAVENOUS
  Filled 2018-09-20 (×6): qty 100

## 2018-09-20 MED ORDER — SODIUM CHLORIDE 0.9 % IV BOLUS
500.0000 mL | Freq: Once | INTRAVENOUS | Status: AC
  Administered 2018-09-20: 500 mL via INTRAVENOUS

## 2018-09-20 MED ORDER — VANCOMYCIN HCL 10 G IV SOLR
1500.0000 mg | Freq: Two times a day (BID) | INTRAVENOUS | Status: DC
  Administered 2018-09-20 – 2018-09-21 (×2): 1500 mg via INTRAVENOUS
  Filled 2018-09-20 (×3): qty 1500

## 2018-09-20 MED ORDER — TAMSULOSIN HCL 0.4 MG PO CAPS
0.4000 mg | ORAL_CAPSULE | Freq: Every day | ORAL | Status: DC
  Administered 2018-09-20 – 2018-09-21 (×2): 0.4 mg via ORAL
  Filled 2018-09-20 (×2): qty 1

## 2018-09-20 MED ORDER — VANCOMYCIN HCL IN DEXTROSE 1-5 GM/200ML-% IV SOLN: 1000.0000 mg | Freq: Three times a day (TID) | INTRAVENOUS | Status: DC

## 2018-09-20 NOTE — Progress Notes (Signed)
Pharmacy Antibiotic Note  Jimmy Rangel is a 72 y.o. male admitted on 09/19/2018 with leukocytosis post TKA  Plan: Cefazolin 2 g q8h Vanc 1500 mg q12h (converted to this 12/10) Monitor renal fx cx vanc lvls prn  Vancomycin 1500 mg IV Q 12 hrs. Goal AUC 400-550. Expected AUC: 497.5 SCr used: 0.87  Height: 5\' 10"  (177.8 cm) Weight: 207 lb (93.9 kg) IBW/kg (Calculated) : 73  Temp (24hrs), Avg:98 F (36.7 C), Min:97.4 F (36.3 C), Max:98.6 F (37 C)  Recent Labs  Lab 09/20/18 0234  WBC 14.9*  CREATININE 0.87    Estimated Creatinine Clearance: 88.4 mL/min (by C-G formula based on SCr of 0.87 mg/dL).    Allergies  Allergen Reactions  . Penicillins Other (See Comments)    Passed out Has patient had a PCN reaction causing immediate rash, facial/tongue/throat swelling, SOB or lightheadedness with hypotension: Yes Has patient had a PCN reaction causing severe rash involving mucus membranes or skin necrosis: No Has patient had a PCN reaction that required hospitalization: No Has patient had a PCN reaction occurring within the last 10 years: No If all of the above answers are "NO", then may proceed with Cephalosporin use.    Levester Fresh, PharmD, BCPS, BCCCP Clinical Pharmacist [phone]  Please check AMION for all [place] numbers  09/20/2018 8:22 AM

## 2018-09-20 NOTE — Addendum Note (Signed)
Addendum  created 09/20/18 1626 by Janeece Riggers, MD   Intraprocedure Blocks edited, Sign clinical note

## 2018-09-20 NOTE — Care Management Note (Addendum)
Case Management Note  Patient Details  Name: Jimmy Rangel MRN: [id number] Date of Birth: 1945-11-24  Subjective/Objective: 72 yr old gentleman s/p right total knee arthroplasty.                   Action/Plan: Patient was preoperatively setup with [facility], no changes. Has all DME and will have family support at discharge.   Expected Discharge Date:   09/21/18               Expected Discharge Plan:  [place]  In-House Referral:  NA  Discharge planning Services  CM Consult  Post Acute Care Choice:  Home Health, Durable Medical Equipment Choice offered to:  Patient  DME Arranged:  3-N-1, Walker rolling, CPM DME Agency:  [facility]  HH Arranged:  PT [place] Agency:  [facility] (formerly [employer])  Status of Service:  Completed, signed off  If discussed at [employer], dates discussed:    Additional Comments:  Coner, [place], RN 09/20/2018, 12:11 PM

## 2018-09-20 NOTE — Progress Notes (Signed)
Subjective: 1 Day Post-Op Procedure(s) (LRB): TOTAL KNEE ARTHROPLASTY (Right) LEFT KNEE INJECTION (Left) Patient reports pain as 5 on 0-10 scale.   Patient doing well overnight.  Has had multiple small voids resulting in good volume.  History of urinary retention after back surgery requiring being discharged with a foley  Objective: Vital signs in last 24 hours: Temp:  [97.4 F (36.3 C)-98.6 F (37 C)] 97.6 F (36.4 C) (12/10 0537) Pulse Rate:  [82-117] 90 (12/10 0537) Resp:  [8-18] 16 (12/10 0537) BP: (117-160)/(76-98) 132/83 (12/10 0537) SpO2:  [92 %-100 %] 95 % (12/10 0537)  Intake/Output from previous day: 12/09 0701 - 12/10 0700 In: 2341.5 [I.V.:2128.3; IV Piggyback:213.2] Out: 8101 [Urine:3500; Blood:25] Intake/Output this shift: Total I/O In: -  Out: 900 [Urine:900]  Recent Labs    09/20/18 0234  HGB 12.1*   Recent Labs    09/20/18 0234  WBC 14.9*  RBC 3.81*  HCT 36.5*  PLT 367   Recent Labs    09/20/18 0234  NA 137  K 4.4  CL 103  CO2 24  BUN 13  CREATININE 0.87  GLUCOSE 178*  CALCIUM 8.9   No results for input(s): LABPT, INR in the last 72 hours.  ABD soft Neurovascular intact Sensation intact distally Intact pulses distally Incision: scant drainage  Anticipated LOS equal to or greater than 2 midnights due to - Age 72 and older with one or more of the following:  - Obesity  - Expected need for hospital services (PT, OT, Nursing) required for safe  discharge  - Anticipated need for postoperative skilled nursing care or inpatient rehab  - Active co-morbidities: None OR   - Unanticipated findings during/Post Surgery: Slow post-op progression: GI, pain control, mobility  - Patient is a high risk of re-admission due to: None   Assessment/Plan: 1 Day Post-Op Procedure(s) (LRB): TOTAL KNEE ARTHROPLASTY (Right) LEFT KNEE INJECTION (Left)  Principal Problem:   Primary localized osteoarthritis of right knee Active Problems:   Lumbar  stenosis with neurogenic claudication   History of splenectomy   Calcification of aortic valve  Patient had light headedness and dizziness getting up from the bed and again outside the door with ambulation.  We returned to chair after the second episode of dizziness.  Will order fluid boluses to help with this.  Will also order flomax to prevent urinary retention in the setting of his history of post op urinary retention after back surgery.  Will also restart IV antibiotics in the setting of his leukocytosis, with history of splenectomy, PCR positive both MRSA and MSSA.   Advance diet Up with therapy Plan for discharge tomorrow    Linda Hedges 09/20/2018, 9:26 AM

## 2018-09-20 NOTE — Progress Notes (Signed)
Physical Therapy Treatment Patient Details Name: Jimmy Rangel MRN: [id number] DOB: 1946-06-28 Today's Date: 09/20/2018    History of Present Illness Admitted for R TKA, L knee injection; WBAT, KI (can dc KI when pt demonstrates good quad control);  has a past medical history of Arthritis, Chronic back pain, GERD (gastroesophageal reflux disease), Headache, History of bronchitis as a child, Hyperlipidemia, Primary localized osteoarthritis of right knee, Sleep apnea, and Weakness.    PT Comments    Patient seen for mobility progression. Pt is making progress toward PT goals and tolerated session well. Pt is able to ambulate160 ft with RW and supervision/min guard for safety and able to ascend/descend steps simulating home entrance with min guard. Continue to progress as tolerated.     Follow Up Recommendations  Follow surgeon's recommendation for DC plan and follow-up therapies;Supervision/Assistance - 24 hour     Equipment Recommendations  None recommended by PT    Recommendations for Other Services       Precautions / Restrictions Precautions Precautions: Knee Restrictions Weight Bearing Restrictions: Yes RLE Weight Bearing: Weight bearing as tolerated Other Position/Activity Restrictions: `    Mobility  Bed Mobility               General bed mobility comments: pt OOB in chair upon arrival  Transfers Overall transfer level: Needs assistance Equipment used: Rolling walker (2 wheeled) Transfers: Sit to/from Stand Sit to Stand: Min guard         General transfer comment: cues for safe hand placement  Ambulation/Gait Ambulation/Gait assistance: Min guard;Supervision Gait Distance (Feet): 160 Feet Assistive device: Rolling walker (2 wheeled) Gait Pattern/deviations: Step-through pattern;Decreased stride length Gait velocity: decreased   General Gait Details: cues for R quad activation during stance phase and increased stride  length/cadence   Stairs Stairs: Yes Stairs assistance: Min guard Stair Management: One rail Right;Step to pattern;Sideways Number of Stairs: (2 steps X 2 trials) General stair comments: cues for sequencing and technique   Wheelchair Mobility    Modified Rankin (Stroke Patients Only)       Balance                                            Cognition Arousal/Alertness: Awake/alert Behavior During Therapy: WFL for tasks assessed/performed Overall Cognitive Status: Within Functional Limits for tasks assessed                                        Exercises      General Comments        Pertinent Vitals/Pain Pain Assessment: 0-10 Pain Score: 4  Pain Location: R knee Pain Descriptors / Indicators: Guarding;Sore Pain Intervention(s): Limited activity within patient's tolerance;Monitored during session;Premedicated before session;Repositioned    Home Living                      Prior Function            PT Goals (current goals can now be found in the care plan section) Acute Rehab PT Goals Patient Stated Goal: back to golf Progress towards PT goals: Progressing toward goals    Frequency    7X/week      PT Plan Current plan remains appropriate    Co-evaluation  AM-PAC PT "6 Clicks" Mobility   Outcome Measure  Help needed turning from your back to your side while in a flat bed without using bedrails?: None Help needed moving from lying on your back to sitting on the side of a flat bed without using bedrails?: A Little Help needed moving to and from a bed to a chair (including a wheelchair)?: A Little Help needed standing up from a chair using your arms (e.g., wheelchair or bedside chair)?: A Little Help needed to walk in hospital room?: A Little Help needed climbing 3-5 steps with a railing? : A Little 6 Click Score: 19    End of Session Equipment Utilized During Treatment: Gait  belt Activity Tolerance: Patient tolerated treatment well Patient left: in chair;with call bell/phone within reach Nurse Communication: Mobility status PT Visit Diagnosis: Unsteadiness on feet (R26.81);Other abnormalities of gait and mobility (R26.89);Pain Pain - Right/Left: Right Pain - part of body: Knee     Time: 0934-1000 PT Time Calculation (min) (ACUTE ONLY): 26 min  Charges:  $Gait Training: 23-37 mins                     Earney Navy, PTA Acute Rehabilitation Services Pager: [phone] Office: [phone]     Darliss Cheney 09/20/2018, 10:14 AM

## 2018-09-20 NOTE — Plan of Care (Signed)
  Problem: Health Behavior/Discharge Planning: Goal: Ability to manage health-related needs will improve Outcome: Progressing   Problem: Activity: Goal: Risk for activity intolerance will decrease Outcome: Progressing   Problem: Elimination: Goal: Will not experience complications related to urinary retention Outcome: Completed/Met   Problem: Pain Managment: Goal: General experience of comfort will improve Outcome: Progressing   Problem: Safety: Goal: Ability to remain free from injury will improve Outcome: Progressing

## 2018-09-20 NOTE — Progress Notes (Signed)
Physical Therapy Treatment Patient Details Name: Jimmy Rangel MRN: [id number] DOB: October 08, 1946 Today's Date: 09/20/2018    History of Present Illness Admitted for R TKA, L knee injection; WBAT, KI (can dc KI when pt demonstrates good quad control);  has a past medical history of Arthritis, Chronic back pain, GERD (gastroesophageal reflux disease), Headache, History of bronchitis as a child, Hyperlipidemia, Primary localized osteoarthritis of right knee, Sleep apnea, and Weakness.    PT Comments    Patient continues to make progress toward PT goals and tolerated session well. Current plan remains appropriate.    Follow Up Recommendations  Follow surgeon's recommendation for DC plan and follow-up therapies;Supervision/Assistance - 24 hour     Equipment Recommendations  None recommended by PT    Recommendations for Other Services       Precautions / Restrictions Precautions Precautions: Knee Restrictions Weight Bearing Restrictions: Yes RLE Weight Bearing: Weight bearing as tolerated Other Position/Activity Restrictions: `    Mobility  Bed Mobility Overal bed mobility: Modified Independent Bed Mobility: Sit to Supine           General bed mobility comments: increased time and effort   Transfers Overall transfer level: Needs assistance Equipment used: Rolling walker (2 wheeled) Transfers: Sit to/from Stand Sit to Stand: Min guard         General transfer comment: min guard for safety  Ambulation/Gait Ambulation/Gait assistance: Supervision Gait Distance (Feet): 160 Feet Assistive device: Rolling walker (2 wheeled) Gait Pattern/deviations: Step-through pattern;Decreased stride length;Decreased stance time - right;Decreased step length - left Gait velocity: decreased   General Gait Details: cues for posture and step length symmetry which improves with increased distance   Stairs Stairs: Yes Stairs assistance: Min guard Stair Management: One rail Right;Step  to pattern;Sideways   General stair comments: pt demonstrates carry over of sequencing and technique    Wheelchair Mobility    Modified Rankin (Stroke Patients Only)       Balance                                            Cognition Arousal/Alertness: Awake/alert Behavior During Therapy: WFL for tasks assessed/performed Overall Cognitive Status: Within Functional Limits for tasks assessed                                        Exercises Total Joint Exercises Ankle Circles/Pumps: AROM;Both Quad Sets: AROM;Both;10 reps Towel Squeeze: AROM;10 reps Heel Slides: AROM;Right;10 reps    General Comments General comments (skin integrity, edema, etc.): wife present      Pertinent Vitals/Pain Pain Assessment: Faces Faces Pain Scale: Hurts little more Pain Location: R knee Pain Descriptors / Indicators: Guarding;Sore;Grimacing Pain Intervention(s): Limited activity within patient's tolerance;Monitored during session;Repositioned;Premedicated before session    Home Living                      Prior Function            PT Goals (current goals can now be found in the care plan section) Acute Rehab PT Goals Patient Stated Goal: back to golf Progress towards PT goals: Progressing toward goals    Frequency    7X/week      PT Plan Current plan remains appropriate    Co-evaluation  AM-PAC PT "6 Clicks" Mobility   Outcome Measure  Help needed turning from your back to your side while in a flat bed without using bedrails?: None Help needed moving from lying on your back to sitting on the side of a flat bed without using bedrails?: A Little Help needed moving to and from a bed to a chair (including a wheelchair)?: A Little Help needed standing up from a chair using your arms (e.g., wheelchair or bedside chair)?: A Little Help needed to walk in hospital room?: A Little Help needed climbing 3-5 steps with a  railing? : A Little 6 Click Score: 19    End of Session Equipment Utilized During Treatment: Gait belt Activity Tolerance: Patient tolerated treatment well Patient left: in bed;with call bell/phone within reach;in CPM;with family/visitor present Nurse Communication: Mobility status PT Visit Diagnosis: Unsteadiness on feet (R26.81);Other abnormalities of gait and mobility (R26.89);Pain Pain - Right/Left: Right Pain - part of body: Knee     Time: 9470-7615 PT Time Calculation (min) (ACUTE ONLY): 32 min  Charges:  $Gait Training: 8-22 mins $Therapeutic Exercise: 8-22 mins                     Earney Navy, PTA Acute Rehabilitation Services Pager: [phone] Office: [phone]     Darliss Cheney 09/20/2018, 4:15 PM

## 2018-09-21 ENCOUNTER — Other Ambulatory Visit (HOSPITAL_COMMUNITY): Payer: Medicare Other

## 2018-09-21 LAB — CBC
HEMATOCRIT: 36.2 % — AB (ref 39.0–52.0)
Hemoglobin: 11.9 g/dL — ABNORMAL LOW (ref 13.0–17.0)
MCH: 31.6 pg (ref 26.0–34.0)
MCHC: 32.9 g/dL (ref 30.0–36.0)
MCV: 96 fL (ref 80.0–100.0)
Platelets: 378 10*3/uL (ref 150–400)
RBC: 3.77 MIL/uL — AB (ref 4.22–5.81)
RDW: 13.4 % (ref 11.5–15.5)
WBC: 15.2 10*3/uL — ABNORMAL HIGH (ref 4.0–10.5)
nRBC: 0 % (ref 0.0–0.2)

## 2018-09-21 LAB — BASIC METABOLIC PANEL
Anion gap: 11 (ref 5–15)
BUN: 16 mg/dL (ref 8–23)
CO2: 26 mmol/L (ref 22–32)
Calcium: 8.8 mg/dL — ABNORMAL LOW (ref 8.9–10.3)
Chloride: 100 mmol/L (ref 98–111)
Creatinine, Ser: 0.79 mg/dL (ref 0.61–1.24)
GFR calc Af Amer: >60 mL/min (ref >60)
GFR calc non Af Amer: >60 mL/min (ref >60)
Glucose, Bld: 138 mg/dL — ABNORMAL HIGH (ref 70–99)
Potassium: 4.3 mmol/L (ref 3.5–5.1)
Sodium: 137 mmol/L (ref 135–145)

## 2018-09-21 MED ORDER — GABAPENTIN 300 MG PO CAPS: ORAL_CAPSULE | ORAL | 0 refills | Status: DC

## 2018-09-21 MED ORDER — OXYCODONE HCL 5 MG PO TABS: ORAL_TABLET | ORAL | 0 refills | Status: DC

## 2018-09-21 MED ORDER — MUPIROCIN 2 % EX OINT: TOPICAL_OINTMENT | Freq: Two times a day (BID) | CUTANEOUS | 0 refills | Status: DC

## 2018-09-21 MED ORDER — POLYETHYLENE GLYCOL 3350 17 G PO PACK: PACK | ORAL | 0 refills | Status: DC

## 2018-09-21 MED ORDER — ASPIRIN 325 MG PO TBEC: 325.0000 mg | DELAYED_RELEASE_TABLET | Freq: Every day | ORAL | 0 refills | Status: DC

## 2018-09-21 MED ORDER — DOCUSATE SODIUM 100 MG PO CAPS: ORAL_CAPSULE | ORAL | 0 refills | Status: DC

## 2018-09-21 MED ORDER — TAMSULOSIN HCL 0.4 MG PO CAPS: 0.4000 mg | ORAL_CAPSULE | Freq: Every day | ORAL | 0 refills | Status: DC

## 2018-09-21 MED ORDER — CEPHALEXIN 500 MG PO CAPS: 500.0000 mg | ORAL_CAPSULE | Freq: Four times a day (QID) | ORAL | 0 refills | Status: AC

## 2018-09-21 NOTE — Plan of Care (Signed)
  Problem: Pain Managment: Goal: General experience of comfort will improve Outcome: Progressing   Problem: Safety: Goal: Ability to remain free from injury will improve Outcome: Progressing   

## 2018-09-21 NOTE — Discharge Summary (Signed)
Patient ID: Jimmy Rangel MRN: [id number] DOB/AGE: 1945/10/24 72 y.o.  Admit date: 09/19/2018 Discharge date: 09/21/2018  Admission Diagnoses:  Principal Problem:   Primary localized osteoarthritis of right knee Active Problems:   Lumbar stenosis with neurogenic claudication   History of splenectomy   Calcification of aortic valve   Discharge Diagnoses:  Same  Past Medical History:  Diagnosis Date  . Arthritis   . Calcification of aortic valve 09/20/2018   Aortic valve: Trileaflet; moderately thickened, moderately   calcified leaflets. Valve mobility was restricted.  . Chronic back pain    stenosis  . GERD (gastroesophageal reflux disease)    takes Omeprazole daily  . Headache    rare  . History of bronchitis as a child   . Hyperlipidemia    takes Atorvastatin daily  . Primary localized osteoarthritis of right knee   . Sleep apnea    uses CPAP, does not know settings  . Weakness    left leg    Surgeries: Procedure(s): TOTAL KNEE ARTHROPLASTY LEFT KNEE INJECTION on 09/19/2018   Consultants:   Discharged Condition: Improved  Hospital Course: Jimmy Rangel is an 72 y.o. male who was admitted 09/19/2018 for operative treatment ofPrimary localized osteoarthritis of right knee. Patient has severe unremitting pain that affects sleep, daily activities, and work/hobbies. After pre-op clearance the patient was taken to the operating room on 09/19/2018 and underwent  Procedure(s): TOTAL KNEE ARTHROPLASTY LEFT KNEE INJECTION.    Patient was given perioperative antibiotics:  Anti-infectives (From admission, onward)   Start     Dose/Rate Route Frequency Ordered Stop   09/21/18 0000  cephALEXin (KEFLEX) 500 MG capsule     500 mg Oral 4 times daily 09/21/18 0845 10/01/18 2359   09/20/18 1600  vancomycin (VANCOCIN) 1,500 mg in sodium chloride 0.9 % 500 mL IVPB     1,500 mg 250 mL/hr over 120 Minutes Intravenous Every 12 hours 09/20/18 0821     09/20/18 0800  ceFAZolin (ANCEF)  IVPB 2g/100 mL premix     2 g 200 mL/hr over 30 Minutes Intravenous Every 8 hours 09/20/18 0759     09/20/18 0800  vancomycin (VANCOCIN) IVPB 1000 mg/200 mL premix  Status:  Discontinued     1,000 mg 200 mL/hr over 60 Minutes Intravenous Every 8 hours 09/20/18 0759 09/20/18 0821   09/19/18 1500  vancomycin (VANCOCIN) IVPB 1000 mg/200 mL premix     1,000 mg 200 mL/hr over 60 Minutes Intravenous Every 8 hours 09/19/18 1309 09/20/18 0649   09/19/18 1315  valACYclovir (VALTREX) tablet 1,000 mg     1,000 mg Oral Daily 09/19/18 1309     09/19/18 1315  ceFAZolin (ANCEF) IVPB 2g/100 mL premix     2 g 200 mL/hr over 30 Minutes Intravenous Every 6 hours 09/19/18 1309 09/19/18 2150   09/19/18 0802  cefUROXime (ZINACEF) injection  Status:  Discontinued       As needed 09/19/18 0802 09/19/18 0929   09/19/18 0618  ceFAZolin (ANCEF) 2-4 GM/100ML-% IVPB    Note to Pharmacy:  Jimmy Rangel, Lindsi   : cabinet override      09/19/18 0618 09/19/18 0732   09/19/18 0615  ceFAZolin (ANCEF) IVPB 2g/100 mL premix     2 g 200 mL/hr over 30 Minutes Intravenous On call to O.R. 09/19/18 7902 09/19/18 0802   09/19/18 0615  vancomycin (VANCOCIN) IVPB 1000 mg/200 mL premix     1,000 mg 200 mL/hr over 60 Minutes Intravenous  Once 09/19/18 0606 09/19/18 0745  Patient was given sequential compression devices, early ambulation, and chemoprophylaxis to prevent DVT.  Patient benefited maximally from hospital stay and there were no complications.    Recent vital signs:  Patient Vitals for the past 24 hrs:  BP Temp Temp src Pulse Resp SpO2  09/21/18 0413 131/75 97.9 F (36.6 C) Oral 73 16 96 %  09/20/18 2030 132/79 97.7 F (36.5 C) Oral 81 16 96 %     Recent laboratory studies:  Recent Labs    09/20/18 0234 09/21/18 0204  WBC 14.9* 15.2*  HGB 12.1* 11.9*  HCT 36.5* 36.2*  PLT 367 378  NA 137 137  K 4.4 4.3  CL 103 100  CO2 24 26  BUN 13 16  CREATININE 0.87 0.79  GLUCOSE 178* 138*  CALCIUM 8.9 8.8*      Discharge Medications:   Allergies as of 09/21/2018      Reactions   Penicillins Other (See Comments)   Passed out Has patient had a PCN reaction causing immediate rash, facial/tongue/throat swelling, SOB or lightheadedness with hypotension: Yes Has patient had a PCN reaction causing severe rash involving mucus membranes or skin necrosis: No Has patient had a PCN reaction that required hospitalization: No Has patient had a PCN reaction occurring within the last 10 years: No If all of the above answers are "NO", then may proceed with Cephalosporin use.      Medication List    STOP taking these medications   diclofenac 75 MG EC tablet Commonly known as:  VOLTAREN   Fish Oil 1200 MG Caps   GLUCOSAMINE CHONDROITIN JOINT PO     TAKE these medications   aspirin 325 MG EC tablet Take 1 tablet (325 mg total) by mouth daily with breakfast. Start taking on:  09/22/2018   cephALEXin 500 MG capsule Commonly known as:  KEFLEX Take 1 capsule (500 mg total) by mouth 4 (four) times daily for 10 days.   docusate sodium 100 MG capsule Commonly known as:  COLACE 1 tab 2 times a day while on narcotics.  STOOL SOFTENER   fluticasone 50 MCG/ACT nasal spray Commonly known as:  FLONASE Place 2 sprays into both nostrils at bedtime as needed for allergies or rhinitis.   gabapentin 300 MG capsule Commonly known as:  NEURONTIN 1 tab po qhs for nerve pain   hydrocortisone 2.5 % cream Apply topically 2 (two) times daily.   mupirocin ointment 2 % Commonly known as:  BACTROBAN Place into the nose 2 (two) times daily.   omeprazole 20 MG capsule Commonly known as:  PRILOSEC Take 20 mg by mouth daily.   oxyCODONE 5 MG immediate release tablet Commonly known as:  Oxy IR/ROXICODONE 1 po q 4 hrs prn pain   polyethylene glycol packet Commonly known as:  MIRALAX / GLYCOLAX 17grams in 6 oz of something to drink twice a day until bowel movement.  LAXITIVE.  Restart if two days since last  bowel movement   tamsulosin 0.4 MG Caps capsule Commonly known as:  FLOMAX Take 1 capsule (0.4 mg total) by mouth daily after breakfast. Start taking on:  09/22/2018   valACYclovir 1000 MG tablet Commonly known as:  VALTREX Take 1 tablet by mouth as needed.            Discharge Care Instructions  (From admission, onward)         Start     Ordered   09/21/18 0000  Change dressing    Comments:  DO NOT REMOVE  BANDAGE OVER SURGICAL INCISION.  [place] WHOLE LEG INCLUDING OVER THE WATERPROOF BANDAGE WITH SOAP AND WATER EVERY DAY.   09/21/18 0900          Diagnostic Studies: No results found.  Disposition: Discharge disposition: 01-Home or Self Care       Discharge Instructions    CPM   Complete by:  As directed    Continuous passive motion machine (CPM):      Use the CPM from 0 to 90 for 6 hours per day.       You may break it up into 2 or 3 sessions per day.      Use CPM for 2 weeks or until you are told to stop.   Call MD / Call 911   Complete by:  As directed    If you experience chest pain or shortness of breath, CALL 911 and be transported to the hospital emergency room.  If you develope a fever above 101 F, pus (white drainage) or increased drainage or redness at the wound, or calf pain, call your surgeon's office.   Change dressing   Complete by:  As directed    DO NOT REMOVE BANDAGE OVER SURGICAL INCISION.  [place] WHOLE LEG INCLUDING OVER THE WATERPROOF BANDAGE WITH SOAP AND WATER EVERY DAY.   Constipation Prevention   Complete by:  As directed    Drink plenty of fluids.  Prune juice may be helpful.  You may use a stool softener, such as Colace (over the counter) 100 mg twice a day.  Use MiraLax (over the counter) for constipation as needed.   Diet - low sodium heart healthy   Complete by:  As directed    Discharge instructions   Complete by:  As directed    INSTRUCTIONS AFTER JOINT REPLACEMENT   Remove items at home which could result in a fall. This  includes throw rugs or furniture in walking pathways ICE to the affected joint every three hours while awake for 30 minutes at a time, for at least the first 3-5 days, and then as needed for pain and swelling.  Continue to use ice for pain and swelling. You may notice swelling that will progress down to the foot and ankle.  This is normal after surgery.  Elevate your leg when you are not up walking on it.   Continue to use the breathing machine you got in the hospital (incentive spirometer) which will help keep your temperature down.  It is common for your temperature to cycle up and down following surgery, especially at night when you are not up moving around and exerting yourself.  The breathing machine keeps your lungs expanded and your temperature down.   DIET:  As you were doing prior to hospitalization, we recommend a well-balanced diet.  DRESSING / WOUND CARE / SHOWERING  Keep the surgical dressing until follow up.  The dressing is water proof, so you can shower without any extra covering.  IF THE DRESSING FALLS OFF or the wound gets wet inside, change the dressing with sterile gauze.  Please use good hand washing techniques before changing the dressing.  Do not use any lotions or creams on the incision until instructed by your surgeon.    ACTIVITY  Increase activity slowly as tolerated, but follow the weight bearing instructions below.   No driving for 6 weeks or until further direction given by your physician.  You cannot drive while taking narcotics.  No lifting or carrying greater than 10  lbs. until further directed by your surgeon. Avoid periods of inactivity such as sitting longer than an hour when not asleep. This helps prevent blood clots.  You may return to work once you are authorized by your doctor.     WEIGHT BEARING   Weight bearing as tolerated with assist device (walker, cane, etc) as directed, use it as long as suggested by your surgeon or therapist, typically at least  2-3 weeks.   EXERCISES  Results after joint replacement surgery are often greatly improved when you follow the exercise, range of motion and muscle strengthening exercises prescribed by your doctor. Safety measures are also important to protect the joint from further injury. Any time any of these exercises cause you to have increased pain or swelling, decrease what you are doing until you are comfortable again and then slowly increase them. If you have problems or questions, call your caregiver or physical therapist for advice.   Rehabilitation is important following a joint replacement. After just a few days of immobilization, the muscles of the leg can become weakened and shrink (atrophy).  These exercises are designed to build up the tone and strength of the thigh and leg muscles and to improve motion. Often times heat used for twenty to thirty minutes before working out will loosen up your tissues and help with improving the range of motion but do not use heat for the first two weeks following surgery (sometimes heat can increase post-operative swelling).   These exercises can be done on a training (exercise) mat, on the floor, on a table or on a bed. Use whatever works the best and is most comfortable for you.    Use music or television while you are exercising so that the exercises are a pleasant break in your day. This will make your life better with the exercises acting as a break in your routine that you can look forward to.   Perform all exercises about fifteen times, three times per day or as directed.  You should exercise both the operative leg and the other leg as well.   Exercises include:  Quad Sets - Tighten up the muscle on the front of the thigh (Quad) and hold for 5-10 seconds.   Straight Leg Raises - With your knee straight (if you were given a brace, keep it on), lift the leg to 60 degrees, hold for 3 seconds, and slowly lower the leg.  Perform this exercise against resistance  later as your leg gets stronger.  Leg Slides: Lying on your back, slowly slide your foot toward your buttocks, bending your knee up off the floor (only go as far as is comfortable). Then slowly slide your foot back down until your leg is flat on the floor again.  Angel Wings: Lying on your back spread your legs to the side as far apart as you can without causing discomfort.  Hamstring Strength:  Lying on your back, push your heel against the floor with your leg straight by tightening up the muscles of your buttocks.  Repeat, but this time bend your knee to a comfortable angle, and push your heel against the floor.  You may put a pillow under the heel to make it more comfortable if necessary.   A rehabilitation program following joint replacement surgery can speed recovery and prevent re-injury in the future due to weakened muscles. Contact your doctor or a physical therapist for more information on knee rehabilitation.    CONSTIPATION  Constipation is defined medically  as fewer than three stools per week and severe constipation as less than one stool per week.  Even if you have a regular bowel pattern at home, your normal regimen is likely to be disrupted due to multiple reasons following surgery.  Combination of anesthesia, postoperative narcotics, change in appetite and fluid intake all can affect your bowels.   YOU MUST use at least one of the following options; they are listed in order of increasing strength to get the job done.  They are all available over the counter, and you may need to use some, POSSIBLY even all of these options:    Drink plenty of fluids (prune juice may be helpful) and high fiber foods Colace 100 mg by mouth twice a day  Senokot for constipation as directed and as needed Dulcolax (bisacodyl), take with full glass of water  Miralax (polyethylene glycol) once or twice a day as needed.  If you have tried all these things and are unable to have a bowel movement in the first  3-4 days after surgery call either your surgeon or your primary doctor.    If you experience loose stools or diarrhea, hold the medications until you stool forms back up.  If your symptoms do not get better within 1 week or if they get worse, check with your doctor.  If you experience "the worst abdominal pain ever" or develop nausea or vomiting, please contact the office immediately for further recommendations for treatment.   ITCHING:  If you experience itching with your medications, try taking only a single pain pill, or even half a pain pill at a time.  You can also use Benadryl over the counter for itching or also to help with sleep.   TED HOSE STOCKINGS:  Use stockings on both legs until for at least 2 weeks or as directed by physician office. They may be removed at night for sleeping.  MEDICATIONS:  See your medication summary on the "After Visit Summary" that nursing will review with you.  You may have some home medications which will be placed on hold until you complete the course of blood thinner medication.  It is important for you to complete the blood thinner medication as prescribed.  PRECAUTIONS:  If you experience chest pain or shortness of breath - call 911 immediately for transfer to the hospital emergency department.   If you develop a fever greater that 101 F, purulent drainage from wound, increased redness or drainage from wound, foul odor from the wound/dressing, or calf pain - CONTACT YOUR SURGEON.                                                   FOLLOW-UP APPOINTMENTS:  If you do not already have a post-op appointment, please call the office for an appointment to be seen by your surgeon.  Guidelines for how soon to be seen are listed in your "After Visit Summary", but are typically between 1-4 weeks after surgery.  OTHER INSTRUCTIONS:   Knee Replacement:  Do not place pillow under knee, focus on keeping the knee straight while resting. CPM instructions: 0-90 degrees, 2  hours in the morning, 2 hours in the afternoon, and 2 hours in the evening. Place foam block, curve side up under heel at all times except when in CPM or when walking.  DO NOT modify,  tear, cut, or change the foam block in any way.  MAKE SURE YOU:  Understand these instructions.  Get help right away if you are not doing well or get worse.    Thank you for letting us be a part of your medical care team.  It is a privilege we respect greatly.  We hope these instructions will help you stay on track for a fast and full recovery!   Do not put a pillow under the knee. Place it under the heel.   Complete by:  As directed    Place gray foam block, curve side up under heel at all times except when in CPM or when walking.  DO NOT modify, tear, cut, or change in any way the gray foam block.   Increase activity slowly as tolerated   Complete by:  As directed    Patient may shower   Complete by:  As directed    Aquacel dressing is water proof    Wash over it and the whole leg with soap and water at the end of your shower   TED hose   Complete by:  As directed    Use stockings (TED hose) for 2 weeks on both leg(s).  You may remove them at night for sleeping.      Follow-up Information    [facility] Follow up.   Specialty:  [place] Why:  A representative from [facility] will contact you to arrange start date and time for your therapy. Contact information: [address] [place] [address] [phone]            Signed: Linda Hedges 09/21/2018, 9:01 AM

## 2018-09-21 NOTE — Progress Notes (Signed)
Discharge instructions completed with pt.  Pt verbalized understanding of the information.  Pt denies chest pain, shortness of breath, dizziness, lightheadedness, and n/v.  Pt's IV discontinued.  Pt discharged home.  

## 2018-09-21 NOTE — Progress Notes (Signed)
Physical Therapy Treatment Patient Details Name: Jimmy Rangel MRN: [id number] DOB: 1945-11-28 Today's Date: 09/21/2018    History of Present Illness Admitted for R TKA, L knee injection; WBAT, KI (can dc KI when pt demonstrates good quad control);  has a past medical history of Arthritis, Chronic back pain, GERD (gastroesophageal reflux disease), Headache, History of bronchitis as a child, Hyperlipidemia, Primary localized osteoarthritis of right knee, Sleep apnea, and Weakness.    PT Comments    Patient seen for mobility progression. Pt tolerated increased gait training distance of 250 ft with RW, stair training, and LE strengthening/ROM exercises. Pt overall requires supervision/min guard for transfers, ambulation, and stairs. Current plan remains appropriate.    Follow Up Recommendations  Follow surgeon's recommendation for DC plan and follow-up therapies;Supervision/Assistance - 24 hour     Equipment Recommendations  None recommended by PT    Recommendations for Other Services       Precautions / Restrictions Precautions Precautions: Knee Precaution Comments: positioning/precautions reviewed with pt Restrictions Weight Bearing Restrictions: Yes RLE Weight Bearing: Weight bearing as tolerated LLE Weight Bearing: Weight bearing as tolerated Other Position/Activity Restrictions: `    Mobility  Bed Mobility               General bed mobility comments: pt OOB in chair upon arrival  Transfers Overall transfer level: Needs assistance Equipment used: Rolling walker (2 wheeled) Transfers: Sit to/from Stand Sit to Stand: Supervision         General transfer comment: supervision for safety  Ambulation/Gait Ambulation/Gait assistance: Supervision Gait Distance (Feet): 250 Feet Assistive device: Rolling walker (2 wheeled) Gait Pattern/deviations: Step-through pattern;Decreased stride length;Decreased weight shift to right Gait velocity: decreased   General Gait  Details: cues for relaxed shoulders, decreased weight bearing through bilat UE, and step length symmetry   Stairs   Stairs assistance: Min guard Stair Management: One rail Right;Step to pattern;Sideways Number of Stairs: (2 stpes X 2 trials) General stair comments: pt demonstrates carry over of sequencing and technique    Wheelchair Mobility    Modified Rankin (Stroke Patients Only)       Balance                                            Cognition Arousal/Alertness: Awake/alert Behavior During Therapy: WFL for tasks assessed/performed Overall Cognitive Status: Within Functional Limits for tasks assessed                                        Exercises Total Joint Exercises Short Arc QuadSinclair Ship;Right;Strengthening;10 reps Hip ABduction/ADduction: AROM;Strengthening;Right;10 reps Straight Leg Raises: AAROM;Right;Strengthening;5 reps Knee Flexion: AAROM;AROM;Right;5 reps;Seated;Other (comment)(10 second holds)    General Comments        Pertinent Vitals/Pain Pain Assessment: Faces Faces Pain Scale: Hurts little more Pain Location: R knee Pain Descriptors / Indicators: Guarding;Sore;Grimacing    Home Living                      Prior Function            PT Goals (current goals can now be found in the care plan section) Acute Rehab PT Goals Patient Stated Goal: back to golf Progress towards PT goals: Progressing toward goals    Frequency  7X/week      PT Plan Current plan remains appropriate    Co-evaluation              AM-PAC PT "6 Clicks" Mobility   Outcome Measure  Help needed turning from your back to your side while in a flat bed without using bedrails?: None Help needed moving from lying on your back to sitting on the side of a flat bed without using bedrails?: A Little Help needed moving to and from a bed to a chair (including a wheelchair)?: A Little Help needed standing up from a chair  using your arms (e.g., wheelchair or bedside chair)?: A Little Help needed to walk in hospital room?: A Little Help needed climbing 3-5 steps with a railing? : A Little 6 Click Score: 19    End of Session Equipment Utilized During Treatment: Gait belt Activity Tolerance: Patient tolerated treatment well Patient left: with call bell/phone within reach;in chair;Other (comment)(R LE in zero degree foam) Nurse Communication: Mobility status PT Visit Diagnosis: Unsteadiness on feet (R26.81);Other abnormalities of gait and mobility (R26.89);Pain Pain - Right/Left: Right Pain - part of body: Knee     Time: 2426-8341 PT Time Calculation (min) (ACUTE ONLY): 27 min  Charges:  $Gait Training: 8-22 mins $Therapeutic Exercise: 8-22 mins                     Earney Navy, PTA Acute Rehabilitation Services Pager: [phone] Office: [phone]     Darliss Cheney 09/21/2018, 8:53 AM

## 2019-02-23 DIAGNOSIS — E785 Hyperlipidemia, unspecified: Secondary | ICD-10-CM | POA: Insufficient documentation

## 2019-06-02 DIAGNOSIS — C4491 Basal cell carcinoma of skin, unspecified: Secondary | ICD-10-CM

## 2019-06-02 HISTORY — DX: Basal cell carcinoma of skin, unspecified: C44.91

## 2019-07-21 ENCOUNTER — Ambulatory Visit: Payer: Self-pay | Admitting: General Surgery

## 2019-10-04 ENCOUNTER — Encounter (HOSPITAL_COMMUNITY): Payer: Self-pay

## 2019-10-04 ENCOUNTER — Other Ambulatory Visit: Payer: Self-pay

## 2019-10-04 ENCOUNTER — Ambulatory Visit (HOSPITAL_COMMUNITY)
Admission: EM | Admit: 2019-10-04 | Discharge: 2019-10-04 | Disposition: A | Discharge status: Home / Self Care | Payer: Medicare Other | Attending: Emergency Medicine | Admitting: Emergency Medicine

## 2019-10-04 DIAGNOSIS — R197 Diarrhea, unspecified: Secondary | ICD-10-CM

## 2019-10-04 DIAGNOSIS — R1030 Lower abdominal pain, unspecified: Secondary | ICD-10-CM

## 2019-10-04 DIAGNOSIS — R509 Fever, unspecified: Secondary | ICD-10-CM

## 2019-10-04 LAB — COMPREHENSIVE METABOLIC PANEL
ALT: 48 U/L — ABNORMAL HIGH (ref 0–44)
AST: 31 U/L (ref 15–41)
Albumin: 3.3 g/dL — ABNORMAL LOW (ref 3.5–5.0)
Alkaline Phosphatase: 62 U/L (ref 38–126)
Anion gap: 10 (ref 5–15)
BUN: 12 mg/dL (ref 8–23)
CO2: 22 mmol/L (ref 22–32)
Calcium: 9 mg/dL (ref 8.9–10.3)
Chloride: 103 mmol/L (ref 98–111)
Creatinine, Ser: 0.99 mg/dL (ref 0.61–1.24)
GFR calc Af Amer: >60 mL/min (ref >60)
GFR calc non Af Amer: >60 mL/min (ref >60)
Glucose, Bld: 144 mg/dL — ABNORMAL HIGH (ref 70–99)
Potassium: 3.7 mmol/L (ref 3.5–5.1)
Sodium: 135 mmol/L (ref 135–145)
Total Bilirubin: 0.4 mg/dL (ref 0.3–1.2)
Total Protein: 7.2 g/dL (ref 6.5–8.1)

## 2019-10-04 LAB — CBC
HCT: 42.3 % (ref 39.0–52.0)
Hemoglobin: 13.9 g/dL (ref 13.0–17.0)
MCH: 31.9 pg (ref 26.0–34.0)
MCHC: 32.9 g/dL (ref 30.0–36.0)
MCV: 97 fL (ref 80.0–100.0)
Platelets: 471 10*3/uL — ABNORMAL HIGH (ref 150–400)
RBC: 4.36 MIL/uL (ref 4.22–5.81)
RDW: 13.9 % (ref 11.5–15.5)
WBC: 9.4 10*3/uL (ref 4.0–10.5)
nRBC: 0.4 % — ABNORMAL HIGH (ref 0.0–0.2)

## 2019-10-04 LAB — POCT URINALYSIS DIP (DEVICE)
Bilirubin Urine: NEGATIVE
Glucose, UA: NEGATIVE mg/dL
Ketones, ur: NEGATIVE mg/dL
Leukocytes,Ua: NEGATIVE
Nitrite: NEGATIVE
Protein, ur: 100 mg/dL — AB
Specific Gravity, Urine: 1.025 (ref 1.005–1.030)
Urobilinogen, UA: 0.2 mg/dL (ref 0.0–1.0)
pH: 5.5 (ref 5.0–8.0)

## 2019-10-04 NOTE — Discharge Instructions (Addendum)
Urine analysis show moderate hemoglobin and protein.  We will send urine for culture and will call if result is abnormal.  CBC and CMP were ordered Advised patient to increase water intake Advised patient to take Tylenol every 4 hours as needed for fever Go to ED for worsening symptoms

## 2019-10-04 NOTE — ED Provider Notes (Signed)
[place]    CSN: [id number] Arrival date & time: 10/04/19  [id number]      History   Chief Complaint Chief Complaint  Patient presents with  . Appointment    9.00  . Chills  . Fatigue    HPI Jimmy Rangel is a 73 y.o. male.   Jimmy Rangel 73 years old male presented to the urgent care with a complaint of fever and lower abdominal pain and diarhea that began 6 days  ago.  Max temperature at home was 101 degrees.  He was tested for COVID-19 with [facility] and it was negative.  Denies precipitating event or positive sick exposure.  Has tried OTC tylenol without relief .  Denies aggravating or alleviating factors.  Didn't reports similar symptoms in the past.  Denies decreased appetite, decreased activity, otalgia, drooling, vomiting, cough, wheezing, rash,  Bladder issues.   The history is provided by the patient. No language interpreter was used.    Past Medical History:  Diagnosis Date  . Arthritis   . Calcification of aortic valve 09/20/2018   Aortic valve: Trileaflet; moderately thickened, moderately   calcified leaflets. Valve mobility was restricted.  . Chronic back pain    stenosis  . GERD (gastroesophageal reflux disease)    takes Omeprazole daily  . Headache    rare  . History of bronchitis as a child   . Hyperlipidemia    takes Atorvastatin daily  . Primary localized osteoarthritis of right knee   . Sleep apnea    uses CPAP, does not know settings  . Weakness    left leg    Patient Active Problem List   Diagnosis Date Noted  . Calcification of aortic valve 09/20/2018  . Pre-operative cardiovascular examination 09/15/2018  . Primary localized osteoarthritis of right knee   . Gastroesophageal reflux disease 08/26/2018  . Hernia of abdominal wall 08/26/2018  . 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 Acute meniscal tear of right knee 02/07/2018  . Complex tear of medial meniscus of right knee as current injury 02/04/2018  . Effusion of right  knee 12/29/2017  . 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 Pure hypercholesterolemia 05/30/2015    Past Surgical History:  Procedure Laterality Date  . BACK SURGERY     Lumbar fusion x2, 1997, 2016  . CHOLECYSTECTOMY    . COLONOSCOPY    . ESOPHAGOGASTRODUODENOSCOPY    . INJECTION KNEE Left 09/19/2018   Procedure: LEFT KNEE INJECTION;  Surgeon: Elsie Saas, MD;  Location: [facility];  Service: Orthopedics;  Laterality: Left;  . KNEE ARTHROSCOPY Right   . spleenectomy    . TOTAL KNEE ARTHROPLASTY Right 09/19/2018   Procedure: TOTAL KNEE ARTHROPLASTY;  Surgeon: Elsie Saas, MD;  Location: [facility];  Service: Orthopedics;  Laterality: Right;       Home Medications    Prior to Admission medications   Medication Sig Start Date End Date Taking? Authorizing Provider  aspirin EC 325 MG EC tablet Take 1 tablet (325 mg total) by mouth daily with breakfast. 09/22/18   Shepperson, Kirstin, PA-C  docusate sodium (COLACE) 100 MG capsule 1 tab 2 times a day while on narcotics.  STOOL SOFTENER 09/21/18   Shepperson, Kirstin, PA-C  fluticasone (FLONASE) 50 MCG/ACT nasal spray Place 2 sprays into both nostrils at bedtime as needed for allergies or rhinitis.    [provider]  gabapentin (NEURONTIN) 300 MG capsule 1 tab po qhs for nerve pain 09/21/18   Shepperson, [place], PA-C  hydrocortisone 2.5 % cream Apply topically 2 (two) times daily. Patient not taking: Reported on 09/02/2018 04/08/18   Wieters, Madelynn Done C, PA-C  mupirocin ointment (BACTROBAN) 2 % Place into the nose 2 (two) times daily. 09/21/18   Shepperson, Kirstin, PA-C  omeprazole (PRILOSEC) 20 MG capsule Take 20 mg by mouth daily.    [provider]  oxyCODONE (OXY IR/ROXICODONE) 5 MG immediate release tablet 1 po q 4 hrs prn pain 09/21/18   Shepperson, Kirstin, PA-C  polyethylene glycol (MIRALAX / GLYCOLAX) packet 17grams in 6 oz of something to drink twice a day until bowel movement.  LAXITIVE.  Restart  if two days since last bowel movement 09/21/18   Shepperson, Kirstin, PA-C  tamsulosin (FLOMAX) 0.4 MG CAPS capsule Take 1 capsule (0.4 mg total) by mouth daily after breakfast. 09/22/18   Shepperson, Kirstin, PA-C  valACYclovir (VALTREX) 1000 MG tablet Take 1 tablet by mouth as needed. 09/13/18   [provider]    Family History Family History  Problem Relation Age of Onset  . Leukemia Mother   . Lung cancer Father     Social History Social History   Tobacco Use  . Smoking status: Never Smoker  . Smokeless tobacco: Never Used  Substance Use Topics  . Alcohol use: Yes    Alcohol/week: 6.0 - 7.0 standard drinks    Types: 6 - 7 Glasses of wine per week    Comment: wine nightly  . Drug use: No     Allergies   Penicillins   Review of Systems Review of Systems  Constitutional: Positive for chills and fever.  HENT: Negative.   Respiratory: Negative.   Cardiovascular: Negative.   Gastrointestinal: Positive for abdominal pain and diarrhea.  ROS: All other are negatives   Physical Exam Triage Vital Signs ED Triage Vitals  Enc Vitals Group     BP 10/04/19 0914 136/68     Pulse Rate 10/04/19 0914 89     Resp 10/04/19 0914 17     Temp 10/04/19 0914 99 F (37.2 C)     Temp Source 10/04/19 0914 Oral     SpO2 10/04/19 0914 96 %     Weight --      Height --      Head Circumference --      Peak Flow --      Pain Score 10/04/19 0912 0     Pain Loc --      Pain Edu? --      Excl. in [place]? --    No data found.  Updated Vital Signs BP 136/68 (BP Location: Left Arm)   Pulse 89   Temp 99 F (37.2 C) (Oral)   Resp 17   SpO2 96%   Visual Acuity Right Eye Distance:   Left Eye Distance:   Bilateral Distance:    Right Eye Near:   Left Eye Near:    Bilateral Near:     Physical Exam Vitals and nursing note reviewed.  Constitutional:      Appearance: Normal appearance. He is normal weight. He is not ill-appearing.  HENT:     Head: Normocephalic.     Right  Ear: Tympanic membrane, ear canal and external ear normal.     Left Ear: Tympanic membrane, ear canal and external ear normal. There is no impacted cerumen.  Cardiovascular:     Rate and Rhythm: Normal rate and regular rhythm.     Pulses: Normal pulses.     Heart sounds: Normal  heart sounds.  Pulmonary:     Effort: Pulmonary effort is normal. No respiratory distress.     Breath sounds: Normal breath sounds.  Chest:     Chest wall: No tenderness.  Abdominal:     General: Abdomen is flat. Bowel sounds are normal. There is distension.     Palpations: There is no mass.     Tenderness: There is no abdominal tenderness. There is no guarding or rebound.     Hernia: A hernia is present.  Neurological:     Mental Status: He is alert.      UC Treatments / Results  Labs (all labs ordered are listed, but only abnormal results are displayed) Labs Reviewed  POCT URINALYSIS DIP (DEVICE) - Abnormal; Notable for the following components:      Result Value   Hgb urine dipstick MODERATE (*)    Protein, ur 100 (*)    All other components within normal limits  URINE CULTURE  CBC  COMPREHENSIVE METABOLIC PANEL    EKG   Radiology No results found.  Procedures Procedures (including critical care time)  Medications Ordered in UC Medications - No data to display  Initial Impression / Assessment and Plan / UC Course  I have reviewed the triage vital signs and the nursing notes.  Pertinent labs & imaging results that were available during my care of the patient were reviewed by me and considered in my medical decision making (see chart for details).   UA show moderate hemoglobin and protein. Urine will be sent for culture.  CBC and CMP was ordered. Advised patient to take Tylenol for fever.  To return for worsening symptoms.  Patient verbalized understanding of the plan of care.  Final Clinical Impressions(s) / UC Diagnoses   Final diagnoses:  Fever, unknown origin  Diarrhea,  unspecified type     Discharge Instructions     Urine analysis show moderate hemoglobin and protein.  We will send urine for culture and will call if result is abnormal.  CBC and CMP were ordered Advised patient to increase water intake Advised patient to take Tylenol every 4 hours as needed for fever Go to ED for worsening symptoms    ED Prescriptions    None     PDMP not reviewed this encounter.   Emerson Monte, [place] 10/04/19 1015

## 2019-10-04 NOTE — ED Triage Notes (Signed)
Patient presents to Urgent Care with complaints of nasal congestion, fatigue, and severe chills since about 8 days ago. Patient reports he was tested for covid two days ago and got negative test results yesterday. Pt was told to  Come here for eval by his PCP, has developed diarrhea as of yesterday, fever of up to 100.4 for which he is taking tylenol/ ibuprofen.

## 2019-10-05 LAB — URINE CULTURE: Culture: NO GROWTH

## 2019-10-13 HISTORY — PX: EYE SURGERY: SHX253

## 2019-10-30 ENCOUNTER — Other Ambulatory Visit (HOSPITAL_COMMUNITY): Payer: Medicare Other

## 2019-11-02 ENCOUNTER — Ambulatory Visit: Admit: 2019-11-02 | Payer: Medicare Other | Admitting: General Surgery

## 2019-11-02 SURGERY — REPAIR, HERNIA, UMBILICAL, LAPAROSCOPIC
Anesthesia: General

## 2019-11-07 ENCOUNTER — Other Ambulatory Visit: Payer: Self-pay | Admitting: Neurosurgery

## 2019-11-07 ENCOUNTER — Other Ambulatory Visit (HOSPITAL_COMMUNITY): Payer: Self-pay | Admitting: Neurosurgery

## 2019-11-07 DIAGNOSIS — S32009K Unspecified fracture of unspecified lumbar vertebra, subsequent encounter for fracture with nonunion: Secondary | ICD-10-CM

## 2019-11-10 ENCOUNTER — Ambulatory Visit (HOSPITAL_COMMUNITY)
Admission: RE | Admit: 2019-11-10 | Discharge: 2019-11-10 | Disposition: A | Discharge status: Home / Self Care | Payer: Medicare Other | Source: Ambulatory Visit | Attending: Neurosurgery | Admitting: Neurosurgery

## 2019-11-10 ENCOUNTER — Other Ambulatory Visit: Payer: Self-pay

## 2019-11-10 DIAGNOSIS — S32009K Unspecified fracture of unspecified lumbar vertebra, subsequent encounter for fracture with nonunion: Secondary | ICD-10-CM | POA: Diagnosis not present

## 2019-11-10 IMAGING — CT CT L SPINE W/O CM
3 of 4 series · 12 of 33 positions shown, 14 images · non-contrast
Comparison: Lumbar radiograph [DATE], MR lumbar spine
[DATE]

CLINICAL DATA: Chronic back pain, concern for hardware fracture

EXAM:
CT LUMBAR SPINE WITHOUT CONTRAST
TECHNIQUE: Multidetector CT imaging of the lumbar spine was performed without
intravenous contrast administration. Multiplanar CT image
reconstructions were also generated.

[Series 8: coronal st · coronal · 0.31mm/px · 3 of 74 slices shown]
[im 15/74  bone]
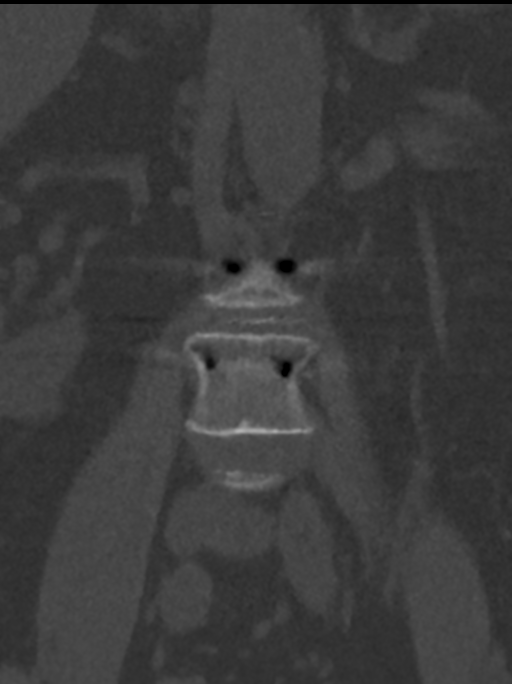
[im 30/74  bone]
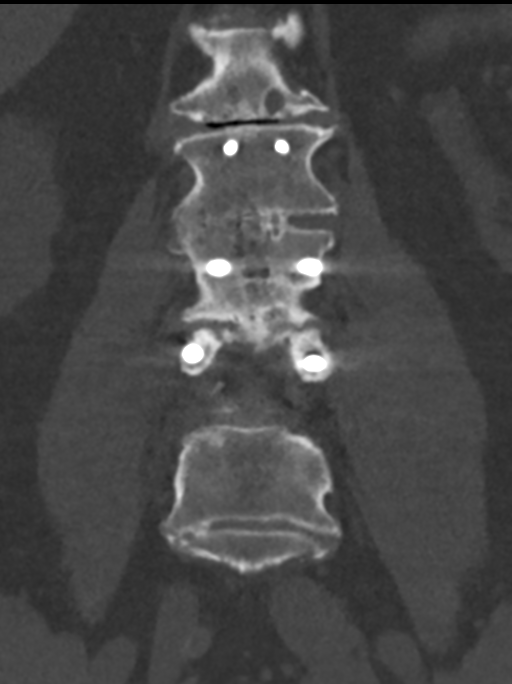
[im 44/74  bone]
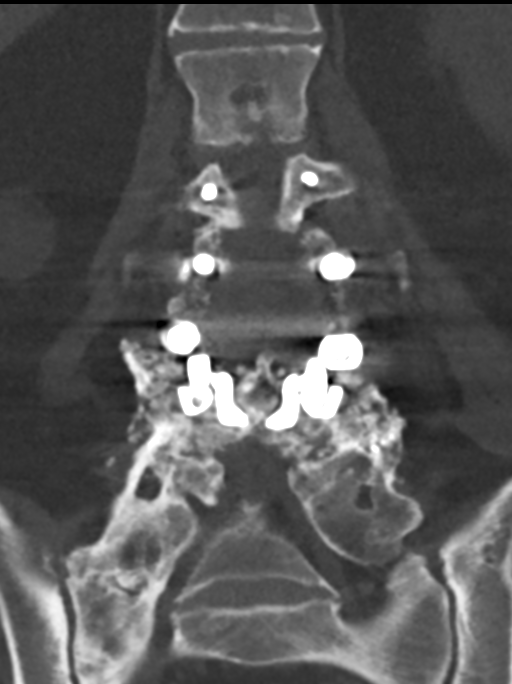

[Series 9: sagittal st · sagittal · 0.31mm/px · 5 of 61 slices shown, 6 images]
[im 21/61  bone]
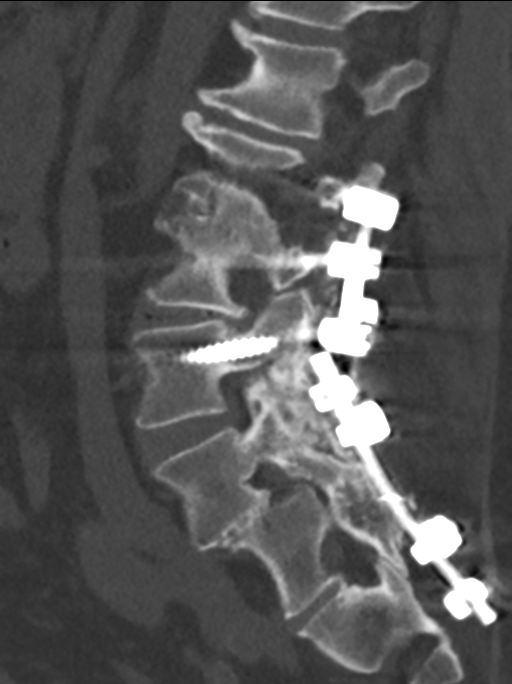
[im 26/61  bone]
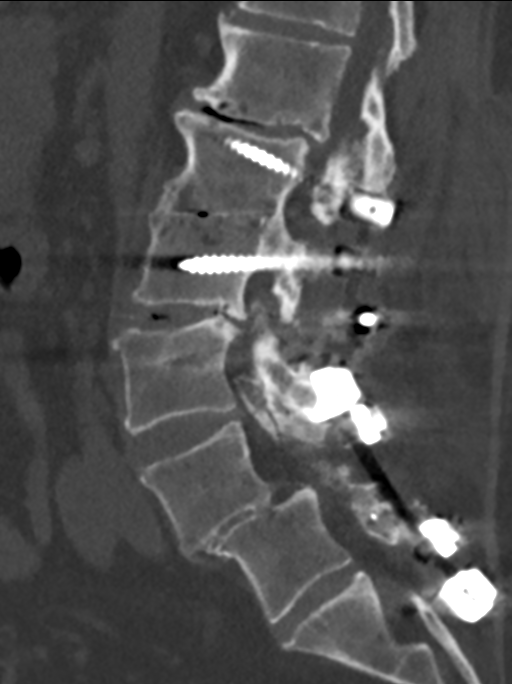
[im 31/61  soft-tissue]
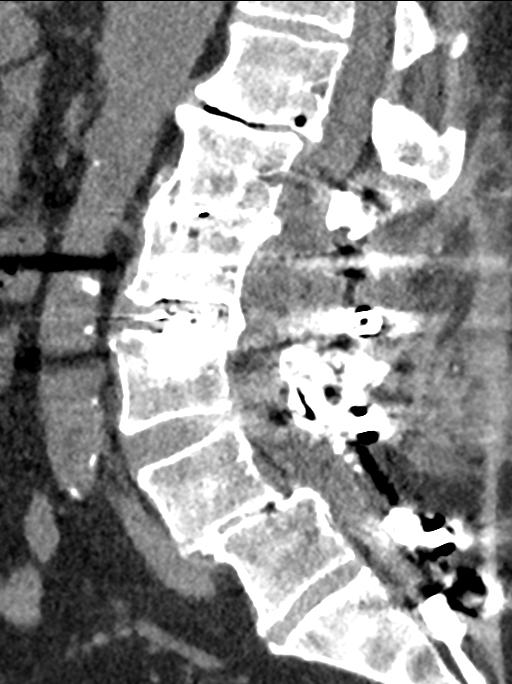
[im 31/61  bone]
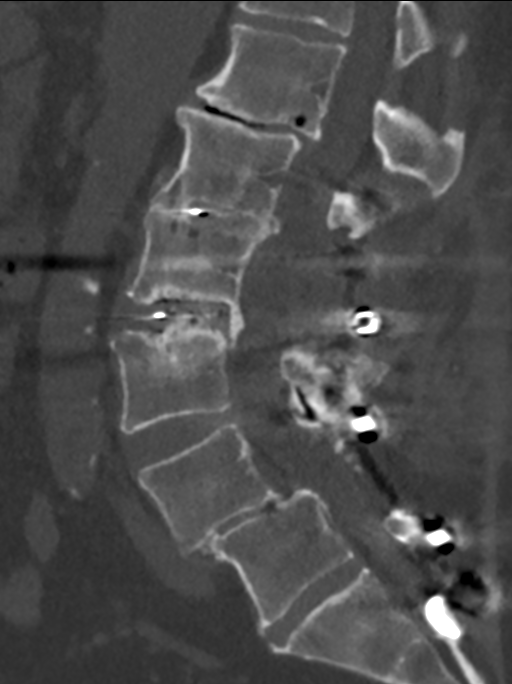
[im 36/61  bone]
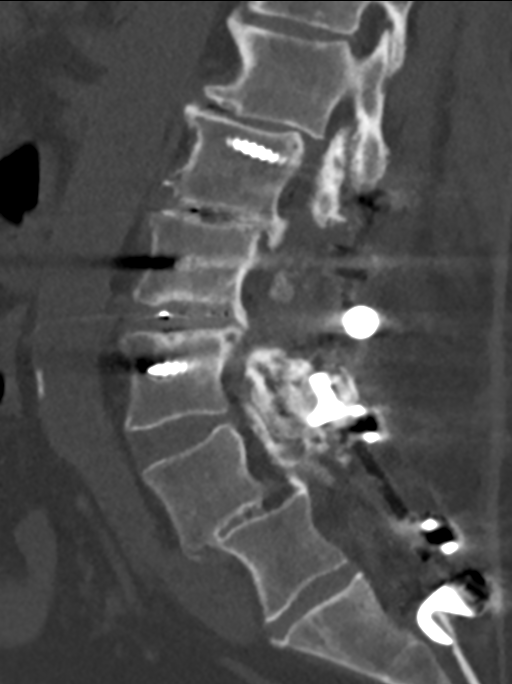
[im 41/61  bone]
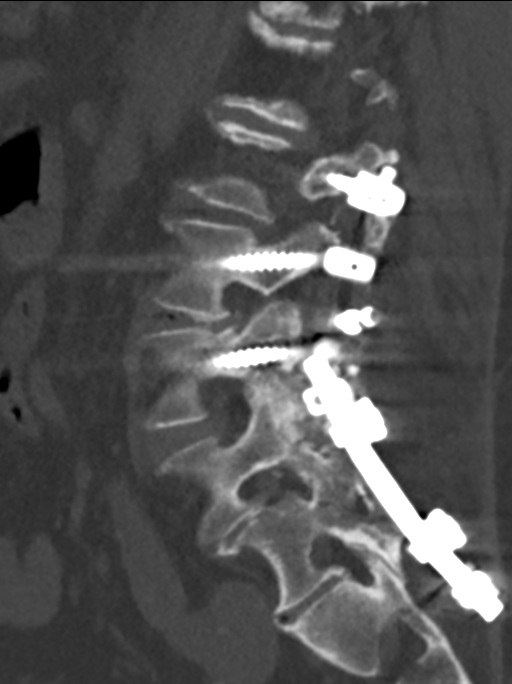

[Series 11: orthogonal axial st · axial · 0.21mm/px · z∈[+726,+890]mm · 4 of 114 slices shown, 5 images]
[im 19/114  soft-tissue]
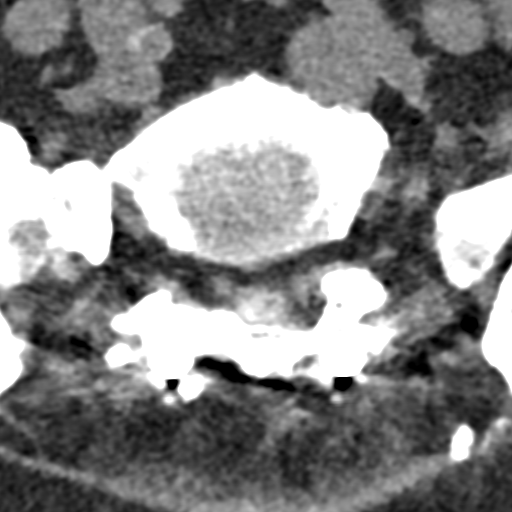
[im 19/114  bone]
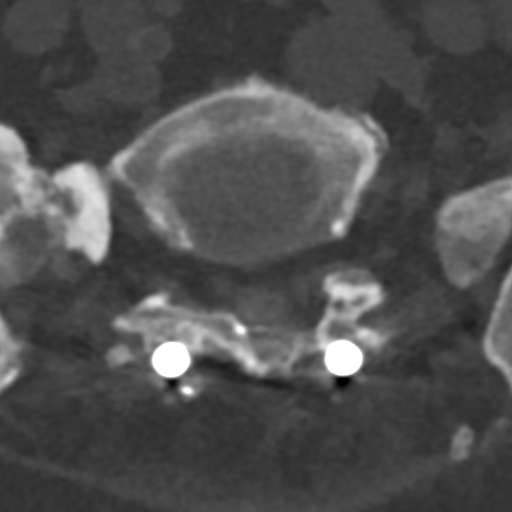
[im 38/114  bone]
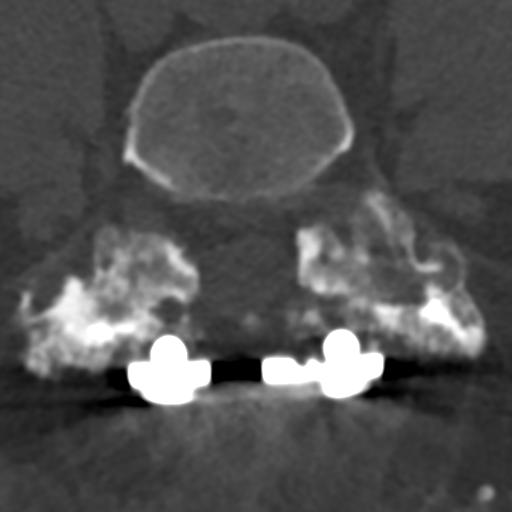
[im 76/114  bone]
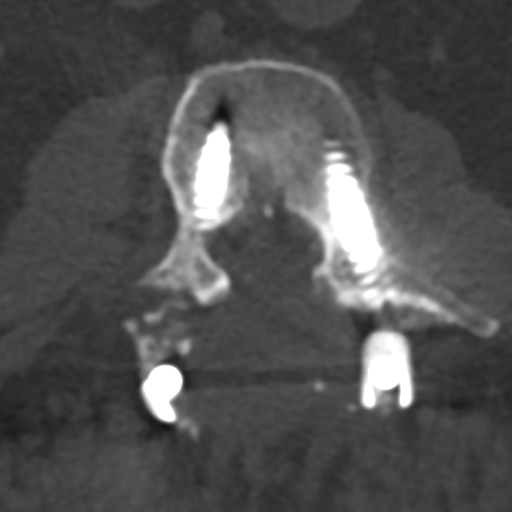
[im 95/114  bone]
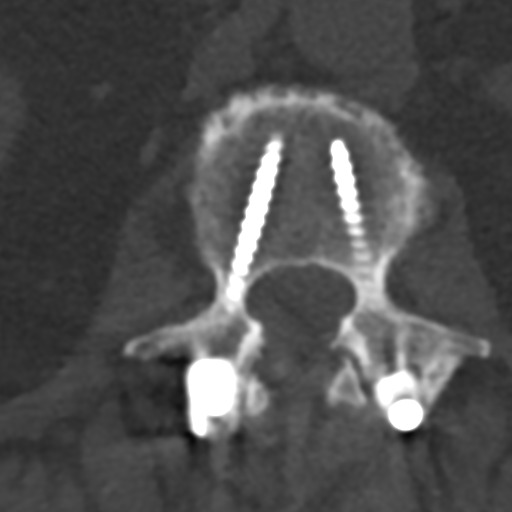

[12 of 33 positions shown; findings below may reference images not displayed]

FINDINGS: Segmentation: Transitional thoracolumbosacral anatomy as described
previously. Numbering system is consistent with recent MRI with
rudimentary ribs seen at the level designated as L1 and partial
lumbarization of the level designated as S1.

Alignment: Posterior spinal fusion cage is seen extending from L1-L4
with inferior extension to the sacrum secured inferiorly by laminar
hooks at the S2-3 level. Partial bony fusion across the L4-5
posterior facets. Near complete bony fusion across the L5-S1 facets.
No evidence of hardware failure or fracture. No periprosthetic
lucency. Unchanged 5 mm of retrolisthesis L1 on L2. Stable grade 2
anterolisthesis L5 on S1.

Vertebrae: There are chronic degenerative endplate changes at L5-1
L2 with multilevel Schmorl's node formations throughout the lumbar
spine. Interbody spacers noted at L2-3 and L3-4. Moderate
degenerative changes noted at the SI joints. Prior left iliac bone
graft harvest site. Posterior laminectomies noted at L3, L4 and L5
left hemilaminectomy at S1.

Paraspinal and other soft tissues: No paravertebral soft tissue
swelling or fluid. Postsurgical fatty atrophy of the paraspinal
musculature in the lower lumbar levels is similar to priors. Chronic
right renal cyst, previously characterized as a simple cyst on prior
MR. Atherosclerotic calcification noted in the abdominal aorta and
branch vessels as imaged. Colonic diverticulosis is present without
evidence of acute diverticulitis.

Disc levels:

Level by level evaluation of the lumbar spine below:

T12-L1: Mild disc height loss. No significant posterior disc
abnormality. No significant canal stenosis or neural foraminal
narrowing.

L1-L2: Retrolisthesis, severe disc height loss, vacuum disc
phenomenon. Minimal endplate spurring and moderate bilateral facet
hypertrophic changes. Mild spinal and bilateral foraminal stenosis.

L2-L3: Posterior decompression and fusion with interbody spacer. No
significant canal stenosis. At most mild bony bilateral foraminal
narrowing.

L3-L4: Posterior decompression and fusion with moderate to severe
facet hypertrophic changes. Interbody spacer placement. No
significant canal stenosis. Mild-to-moderate right neural foraminal
narrowing is noted. Mild narrowing on the left as well.

L4-L5: Moderate disc height loss with posterior decompression and
fusion. Severe bilateral facet hypertrophic changes are noted.
Moderate to severe right neural foraminal narrowing. Mild left
neural foraminal narrowing.

L5-S1: Anterolisthesis, as detailed above. Posterior decompression
and fusion. No significant spinal stenosis. Persistent right lateral
recess stenosis with contact of the traversing nerve root and mild
bilateral neural foraminal narrowing.

S1-S2: Lumbarized with bilateral facet arthropathy. No significant
canal stenosis or foraminal narrowing.
IMPRESSION: 1. Transitional thoracolumbosacral anatomy. Rudimentary ribs at L1
and lumbarization of S1. Numbering system compatible with most
recent comparison MRI.
2. L1-L4 posterior spinal fusion cage with separate inferior
extension to the sacrum secured inferiorly by laminar hooks at the
S2-3 level. No evidence of periprosthetic fracture, hardware
loosening or other complication.
3. There are multilevel degenerative changes, detailed above.
Maximal narrowing noted at the right L3-4 neural foramen and
persistent right lateral recess stenosis at L5-S1 with contact of
the exiting nerve root which can be a pain generator in this
patient.
4. Mild adjacent segment disease at L1-2.

## 2019-11-16 ENCOUNTER — Other Ambulatory Visit: Payer: Self-pay

## 2019-11-16 ENCOUNTER — Encounter: Payer: Self-pay | Admitting: Physical Therapy

## 2019-11-16 ENCOUNTER — Ambulatory Visit: Payer: Medicare Other | Attending: Neurosurgery | Admitting: Physical Therapy

## 2019-11-16 DIAGNOSIS — M545 Low back pain: Secondary | ICD-10-CM | POA: Insufficient documentation

## 2019-11-16 DIAGNOSIS — G8929 Other chronic pain: Secondary | ICD-10-CM | POA: Diagnosis present

## 2019-11-16 DIAGNOSIS — R279 Unspecified lack of coordination: Secondary | ICD-10-CM | POA: Diagnosis present

## 2019-11-16 DIAGNOSIS — M6281 Muscle weakness (generalized): Secondary | ICD-10-CM | POA: Diagnosis present

## 2019-11-16 NOTE — Patient Instructions (Addendum)
Trigger Point Dry Needling  . What is Trigger Point Dry Needling (DN)? o DN is a physical therapy technique used to treat muscle pain and dysfunction. Specifically, DN helps deactivate muscle trigger points (muscle knots).  o A thin filiform needle is used to penetrate the skin and stimulate the underlying trigger point. The goal is for a local twitch response (LTR) to occur and for the trigger point to relax. No medication of any kind is injected during the procedure.   . What Does Trigger Point Dry Needling Feel Like?  o The procedure feels different for each individual patient. Some patients report that they do not actually feel the needle enter the skin and overall the process is not painful. Very mild bleeding may occur. However, many patients feel a deep cramping in the muscle in which the needle was inserted. This is the local twitch response.   Marland Kitchen How Will I feel after the treatment? o Soreness is normal, and the onset of soreness may not occur for a few hours. Typically this soreness does not last longer than two days.  o Bruising is uncommon, however; ice can be used to decrease any possible bruising.  o In rare cases feeling tired or nauseous after the treatment is normal. In addition, your symptoms may get worse before they get better, this period will typically not last longer than 24 hours.   . What Can I do After My Treatment? o Increase your hydration by drinking more water for the next 24 hours. o You may place ice or heat on the areas treated that have become sore, however, do not use heat on inflamed or bruised areas. Heat often brings more relief post needling. o You can continue your regular activities, but vigorous activity is not recommended initially after the treatment for 24 hours. o DN is best combined with other physical therapy such as strengthening, stretching, and other therapies.    [facility] [address], [place], [place]  [address] Phone # [phone] Fax [phone]  Access Code: [email or web address]  URL: [email or web address]  Date: 11/16/2019  Prepared by: Jari Favre   Exercises Supine Hip Adductor Squeeze with Small Ball - 10 reps - 2 sets - 3 sec hold - 1x daily - 7x weekly Supine Single Knee to Chest Stretch - 5 reps - 1 sets - 10 sec hold                            - 2x daily - 7x weekly Supine Bilateral Hip Internal Rotation Stretch - 3 reps - 1 sets - 30 sec hold - 1x daily - 7x weekly Supine Double Knee to Chest - 5 reps - 1 sets - 10 sec hold - 1x daily - 7x weekly Standing Hamstring Stretch with Step - 3 reps - 1 sets - 30 sec hold - 1x daily - 7x weekly Gastroc Stretch on Wall - 3 reps - 1 sets - 30 sec hold - 1x daily - 7x weekly

## 2019-11-16 NOTE — Therapy (Signed)
[facility] [address] [place], [place], [address] Phone: [phone]   Fax:  [phone]  Physical Therapy Evaluation  Patient Details  Name: Jimmy Rangel MRN: [id number] Date of Birth: 10-24-1945 Referring Provider (PT): Jovita Gamma, MD   Encounter Date: 11/16/2019  PT End of Session - 11/16/19 0933    Visit Number  1    Date for PT Re-Evaluation  01/11/20    PT Start Time  [id number]    PT Stop Time  0930    PT Time Calculation (min)  43 min    Activity Tolerance  Patient tolerated treatment well    Behavior During Therapy  [facility] for tasks assessed/performed       Past Medical History:  Diagnosis Date  . Arthritis   . Calcification of aortic valve 09/20/2018   Aortic valve: Trileaflet; moderately thickened, moderately   calcified leaflets. Valve mobility was restricted.  . Chronic back pain    stenosis  . GERD (gastroesophageal reflux disease)    takes Omeprazole daily  . Headache    rare  . History of bronchitis as a child   . Hyperlipidemia    takes Atorvastatin daily  . Primary localized osteoarthritis of right knee   . Sleep apnea    uses CPAP, does not know settings  . Weakness    left leg    Past Surgical History:  Procedure Laterality Date  . BACK SURGERY     Lumbar fusion x2, 1997, 2016  . CHOLECYSTECTOMY    . COLONOSCOPY    . ESOPHAGOGASTRODUODENOSCOPY    . INJECTION KNEE Left 09/19/2018   Procedure: LEFT KNEE INJECTION;  Surgeon: Elsie Saas, MD;  Location: [place];  Service: Orthopedics;  Laterality: Left;  . KNEE ARTHROSCOPY Right   . spleenectomy    . TOTAL KNEE ARTHROPLASTY Right 09/19/2018   Procedure: TOTAL KNEE ARTHROPLASTY;  Surgeon: Elsie Saas, MD;  Location: [place];  Service: Orthopedics;  Laterality: Right;    There were no vitals filed for this visit.   Subjective Assessment - 11/16/19 0851    Subjective  Pt has back surgery 3 years ago.  Then pain started when  pulling a golf bag out of the cart December 2.    Pertinent History  Lt knee replacement Dec 2019; spleen removed in 2000    Diagnostic tests  MRI and CT scan - broken screw from surgery but doesn't need surgery    Patient Stated Goals  strengthen core    Currently in Pain?  Yes   not currently experiencing pain   Pain Score  5     Pain Location  Back    Pain Orientation  Right;Left    Pain Descriptors / Indicators  Sharp;Sore    Pain Type  Chronic pain    Pain Radiating Towards  legs feel weak inner thigh;    Aggravating Factors   lifting, move wrong    Pain Relieving Factors  take pain med or xtra strength tylenol, rest    Effect of Pain on Daily Activities  can't play golf, less activities and yard work is not getting done    Multiple Pain Sites  No         [facility] PT Assessment - 11/16/19 0001      Assessment   Medical Diagnosis  M54.42 (ICD-10-CM) - Lumbago with sciatica, left side    Referring Provider (PT)  Jovita Gamma, MD      Precautions  Precautions  None      Restrictions   Weight Bearing Restrictions  No      Balance Screen   Has the patient fallen in the past 6 months  No      [place] residence    Living Arrangements  Spouse/significant other      Prior Function   Level of Independence  Independent      Cognition   Overall Cognitive Status  Within Functional Limits for tasks assessed      Observation/Other Assessments   Focus on Therapeutic Outcomes (FOTO)   51% limited      Posture/Postural Control   Posture/Postural Control  Postural limitations    Postural Limitations  Rounded Shoulders;Increased thoracic kyphosis;Flexed trunk      ROM / Strength   AROM / PROM / Strength  PROM;Strength      PROM   Overall PROM Comments  hip ER about 30% limited; IR about 50% limited      Strength   Overall Strength Comments  hip abduction, adduction, flexion 4/5 MMT      Flexibility   Soft Tissue Assessment  /Muscle Length  yes    Hamstrings  45 deg bilat      Palpation   Palpation comment  lumbar paraspinlas and thoracolumbar fascia tight; diastasis and tenting of abdomen with sit up      Ambulation/Gait   Gait Pattern  Within Functional Limits                Objective measurements completed on examination: See above findings.      [place] Adult PT Treatment/Exercise - 11/16/19 0001      Self-Care   Self-Care  Other Self-Care Comments    Other Self-Care Comments   educated and performed initial HEP Access Code: [email or web address]       Trigger Point Dry Needling - 11/16/19 0001    Education Handout Provided  Yes           PT Education - 11/16/19 0932    Education Details  Access Code: [email or web address]    Person(s) Educated  Patient    Methods  Explanation;Demonstration;Tactile cues;Verbal cues;Handout    Comprehension  Verbalized understanding;Returned demonstration       PT Short Term Goals - 11/16/19 1717      PT SHORT TERM GOAL #1   Title  ind with initial HEP    Time  4    Period  Weeks    Status  New    Target Date  12/14/19        PT Long Term Goals - 11/16/19 1238      PT LONG TERM GOAL #1   Title  The patient will be independent in safe self progression of HEP needed for further improvements in mobility and function  07/21/16    Time  8    Period  Weeks    Status  New    Target Date  01/11/20      PT LONG TERM GOAL #2   Title  Overall pain and discomfort will be < 3/10 with light yard work such as cleaning up branches    Time  8    Period  Weeks    Status  New    Target Date  01/11/20      PT LONG TERM GOAL #3   Title  FOTO < or = to 41% limited    Time  8  Period  Weeks    Status  New    Target Date  01/11/20      PT LONG TERM GOAL #4   Title  Patient will have improved hip IR/ER ROM adequate for future return to golf    Time  8    Period  Weeks    Status  New    Target Date  01/11/20      PT LONG TERM GOAL #5   Title  Patient will  report overall pain and function improved by > 70%    Time  8    Period  Weeks    Status  New    Target Date  01/11/20             Plan - 11/16/19 0933    Clinical Impression Statement  Pt presents to skilled PT due to flare up of chronic low back pain that radiates into LE.  Pt has had fusion of L2/3 and L4/5.  Pt had recent imaging that shows broken screw but not in need of surgery.  Pt has abdominal weakness and scar tissue along midline of abdomen from spleen surgery. Pt has an umbilical hernia.  He has tight hamstrings and decreased hip rotation ROM.  Pt has decreased lumbar mobility du eto muscle tension and lumbar fusions.  Pt has LE weakness as mentioned above.  Pt has posture abnormalities and will benfit from skilled PT to work on correctly coordinating core with functional movements as well as address all impairments.    Personal Factors and Comorbidities  Comorbidity 3+    Comorbidities  umbilical hernia; abdominal surgery; 2 back fusions    Stability/Clinical Decision Making  Stable/Uncomplicated    Clinical Decision Making  Low    PT Frequency  2x / week    PT Duration  8 weeks    PT Treatment/Interventions  ADLs/Self Care Home Management;Biofeedback;Cryotherapy;Electrical Stimulation;Iontophoresis 4mg /ml Dexamethasone;Aquatic Therapy;Moist Heat;Ultrasound;Therapeutic exercise;Neuromuscular re-education;Therapeutic activities;Patient/family education;Manual techniques;Splinting;Energy conservation;Dry needling;Passive range of motion;Taping    PT Next Visit Plan  basic core strengthening, f/u on HEP    PT Home Exercise Plan  Access Code: [email or web address]    Consulted and Agree with Plan of Care  Patient       Patient will benefit from skilled therapeutic intervention in order to improve the following deficits and impairments:  Pain, Postural dysfunction, Decreased strength, Decreased mobility, Decreased coordination, Decreased range of motion  Visit Diagnosis: Chronic bilateral  low back pain, unspecified whether sciatica present  Muscle weakness (generalized)  Unspecified lack of coordination     Problem List Patient Active Problem List   Diagnosis Date Noted  . Calcification of aortic valve 09/20/2018  . Pre-operative cardiovascular examination 09/15/2018  . Primary localized osteoarthritis of right knee   . Gastroesophageal reflux disease 08/26/2018  . Hernia of abdominal wall 08/26/2018  . 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 Acute meniscal tear of right knee 02/07/2018  . Complex tear of medial meniscus of right knee as current injury 02/04/2018  . Effusion of right knee 12/29/2017  . 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 Pure hypercholesterolemia 05/30/2015    Jule Ser, PT 11/16/2019, 5:25 PM  [facility] [address], [place], [place], [address] Phone: [phone]   Fax:  [phone]  Name: Jimmy Rangel MRN: [id number] Date of Birth: May 18, 1946

## 2019-11-20 ENCOUNTER — Ambulatory Visit: Payer: Medicare Other | Admitting: Physical Therapy

## 2019-11-20 ENCOUNTER — Other Ambulatory Visit: Payer: Self-pay

## 2019-11-20 ENCOUNTER — Encounter: Payer: Self-pay | Admitting: Physical Therapy

## 2019-11-20 DIAGNOSIS — G8929 Other chronic pain: Secondary | ICD-10-CM

## 2019-11-20 DIAGNOSIS — M545 Low back pain, unspecified: Secondary | ICD-10-CM

## 2019-11-20 DIAGNOSIS — M6281 Muscle weakness (generalized): Secondary | ICD-10-CM

## 2019-11-20 DIAGNOSIS — R279 Unspecified lack of coordination: Secondary | ICD-10-CM

## 2019-11-20 NOTE — Patient Instructions (Signed)

## 2019-11-20 NOTE — Therapy (Signed)
[facility] [address] [place], [place], [address] Phone: [phone]   Fax:  [phone]  Physical Therapy Treatment  Patient Details  Name: Jimmy Rangel MRN: [id number] Date of Birth: Apr 06, 1946 Referring Provider (PT): Jovita Gamma, MD   Encounter Date: 11/20/2019  PT End of Session - 11/20/19 1106    Visit Number  2    Date for PT Re-Evaluation  01/11/20    PT Start Time  1105    PT Stop Time  1145    PT Time Calculation (min)  40 min    Activity Tolerance  Patient tolerated treatment well    Behavior During Therapy  [facility] for tasks assessed/performed       Past Medical History:  Diagnosis Date  . Arthritis   . Calcification of aortic valve 09/20/2018   Aortic valve: Trileaflet; moderately thickened, moderately   calcified leaflets. Valve mobility was restricted.  . Chronic back pain    stenosis  . GERD (gastroesophageal reflux disease)    takes Omeprazole daily  . Headache    rare  . History of bronchitis as a child   . Hyperlipidemia    takes Atorvastatin daily  . Primary localized osteoarthritis of right knee   . Sleep apnea    uses CPAP, does not know settings  . Weakness    left leg    Past Surgical History:  Procedure Laterality Date  . BACK SURGERY     Lumbar fusion x2, 1997, 2016  . CHOLECYSTECTOMY    . COLONOSCOPY    . ESOPHAGOGASTRODUODENOSCOPY    . INJECTION KNEE Left 09/19/2018   Procedure: LEFT KNEE INJECTION;  Surgeon: Elsie Saas, MD;  Location: [place];  Service: Orthopedics;  Laterality: Left;  . KNEE ARTHROSCOPY Right   . spleenectomy    . TOTAL KNEE ARTHROPLASTY Right 09/19/2018   Procedure: TOTAL KNEE ARTHROPLASTY;  Surgeon: Elsie Saas, MD;  Location: [place];  Service: Orthopedics;  Laterality: Right;    There were no vitals filed for this visit.  Subjective Assessment - 11/20/19 1107    Subjective  I am enjoying the stretches.    Pertinent History  Lt knee  replacement Dec 2019; spleen removed in 2000    Currently in Pain?  No/denies    Multiple Pain Sites  No                       [facility] Adult PT Treatment/Exercise - 11/20/19 0001      Lumbar Exercises: Stretches   Active Hamstring Stretch  Right;Left;2 reps;30 seconds    Active Hamstring Stretch Limitations  with strap    Single Knee to Chest Stretch  Right;Left;2 reps;20 seconds    Double Knee to Chest Stretch  2 reps;30 seconds      Lumbar Exercises: Aerobic   Nustep  L1 x 6 min at end of session      Knee/Hip Exercises: Stretches   Active Hamstring Stretch  Left;2 reps;30 seconds    Passive Hamstring Stretch Limitations  VC to straighten knee more    Other Knee/Hip Stretches  Hip Int rotation stretching Bil 3x 30 sec Bil              PT Education - 11/20/19 1122    Education Details  Level 1 Core HEP    Person(s) Educated  Patient    Methods  Explanation;Demonstration;Tactile cues;Verbal cues;Handout    Comprehension  Returned demonstration;Verbalized understanding  PT Short Term Goals - 11/16/19 1717      PT SHORT TERM GOAL #1   Title  ind with initial HEP    Time  4    Period  Weeks    Status  New    Target Date  12/14/19        PT Long Term Goals - 11/16/19 1238      PT LONG TERM GOAL #1   Title  The patient will be independent in safe self progression of HEP needed for further improvements in mobility and function  07/21/16    Time  8    Period  Weeks    Status  New    Target Date  01/11/20      PT LONG TERM GOAL #2   Title  Overall pain and discomfort will be < 3/10 with light yard work such as cleaning up branches    Time  8    Period  Weeks    Status  New    Target Date  01/11/20      PT LONG TERM GOAL #3   Title  FOTO < or = to 41% limited    Time  8    Period  Weeks    Status  New    Target Date  01/11/20      PT LONG TERM GOAL #4   Title  Patient will have improved hip IR/ER ROM adequate for future return to golf     Time  8    Period  Weeks    Status  New    Target Date  01/11/20      PT LONG TERM GOAL #5   Title  Patient will report overall pain and function improved by > 70%    Time  8    Period  Weeks    Status  New    Target Date  01/11/20            Plan - 11/20/19 1107    Clinical Impression Statement  Pt arrives pain free, independent and compliant with his initial HEP. PTA added supine level 1 core stabilization to HEP.    Personal Factors and Comorbidities  Comorbidity 3+    Comorbidities  umbilical hernia; abdominal surgery; 2 back fusions    Stability/Clinical Decision Making  Stable/Uncomplicated    PT Frequency  2x / week    PT Duration  8 weeks    PT Treatment/Interventions  ADLs/Self Care Home Management;Biofeedback;Cryotherapy;Electrical Stimulation;Iontophoresis 4mg /ml Dexamethasone;Aquatic Therapy;Moist Heat;Ultrasound;Therapeutic exercise;Neuromuscular re-education;Therapeutic activities;Patient/family education;Manual techniques;Splinting;Energy conservation;Dry needling;Passive range of motion;Taping    PT Next Visit Plan  Review new core stabilization exercises, then make progressions if pt ready.    PT Home Exercise Plan  Access Code: [email or web address]       Patient will benefit from skilled therapeutic intervention in order to improve the following deficits and impairments:  Pain, Postural dysfunction, Decreased strength, Decreased mobility, Decreased coordination, Decreased range of motion  Visit Diagnosis: Chronic bilateral low back pain, unspecified whether sciatica present  Muscle weakness (generalized)  Unspecified lack of coordination     Problem List Patient Active Problem List   Diagnosis Date Noted  . Calcification of aortic valve 09/20/2018  . Pre-operative cardiovascular examination 09/15/2018  . Primary localized osteoarthritis of right knee   . Gastroesophageal reflux disease 08/26/2018  . Hernia of abdominal wall 08/26/2018  . History of  splenectomy 08/26/2018  . Acute meniscal tear of right knee 02/07/2018  . Complex tear  of medial meniscus of right knee as current injury 02/04/2018  . Effusion of right knee 12/29/2017  . 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 Pure hypercholesterolemia 05/30/2015    Harsh Trulock, PTA 11/20/2019, 11:44 AM  [facility] [address] [place], [place], [address] Phone: [phone]   Fax:  [phone]  Name: MALCUM LAHUE MRN: [id number] Date of Birth: 11-03-1945

## 2019-11-22 ENCOUNTER — Encounter: Payer: Self-pay | Admitting: Physical Therapy

## 2019-11-22 ENCOUNTER — Other Ambulatory Visit: Payer: Self-pay

## 2019-11-22 ENCOUNTER — Ambulatory Visit: Payer: Medicare Other | Admitting: Physical Therapy

## 2019-11-22 DIAGNOSIS — M6281 Muscle weakness (generalized): Secondary | ICD-10-CM

## 2019-11-22 DIAGNOSIS — G8929 Other chronic pain: Secondary | ICD-10-CM

## 2019-11-22 DIAGNOSIS — R279 Unspecified lack of coordination: Secondary | ICD-10-CM

## 2019-11-22 DIAGNOSIS — M545 Low back pain, unspecified: Secondary | ICD-10-CM

## 2019-11-22 NOTE — Therapy (Signed)
[facility] [address] [place], [place], [address] Phone: [phone]   Fax:  [phone]  Physical Therapy Treatment  Patient Details  Name: Jimmy Rangel MRN: [id number] Date of Birth: 1945/10/15 Referring Provider (PT): Jovita Gamma, MD   Encounter Date: 11/22/2019  PT End of Session - 11/22/19 1021    Visit Number  3    Date for PT Re-Evaluation  01/11/20    PT Start Time  [id number]    PT Stop Time  1059    PT Time Calculation (min)  44 min    Activity Tolerance  Patient tolerated treatment well    Behavior During Therapy  [facility] for tasks assessed/performed       Past Medical History:  Diagnosis Date  . Arthritis   . Calcification of aortic valve 09/20/2018   Aortic valve: Trileaflet; moderately thickened, moderately   calcified leaflets. Valve mobility was restricted.  . Chronic back pain    stenosis  . GERD (gastroesophageal reflux disease)    takes Omeprazole daily  . Headache    rare  . History of bronchitis as a child   . Hyperlipidemia    takes Atorvastatin daily  . Primary localized osteoarthritis of right knee   . Sleep apnea    uses CPAP, does not know settings  . Weakness    left leg    Past Surgical History:  Procedure Laterality Date  . BACK SURGERY     Lumbar fusion x2, 1997, 2016  . CHOLECYSTECTOMY    . COLONOSCOPY    . ESOPHAGOGASTRODUODENOSCOPY    . INJECTION KNEE Left 09/19/2018   Procedure: LEFT KNEE INJECTION;  Surgeon: Elsie Saas, MD;  Location: [place];  Service: Orthopedics;  Laterality: Left;  . KNEE ARTHROSCOPY Right   . spleenectomy    . TOTAL KNEE ARTHROPLASTY Right 09/19/2018   Procedure: TOTAL KNEE ARTHROPLASTY;  Surgeon: Elsie Saas, MD;  Location: [place];  Service: Orthopedics;  Laterality: Right;    There were no vitals filed for this visit.  Subjective Assessment - 11/22/19 1020    Subjective  I did a lot of housework yesterday and it may have made me  sore.    Pertinent History  Lt knee replacement Dec 2019; spleen removed in 2000    Patient Stated Goals  strengthen core    Currently in Pain?  No/denies    Multiple Pain Sites  No                       [facility] Adult PT Treatment/Exercise - 11/22/19 0001      Self-Care   Self-Care  ADL's;Posture    ADL's  Sweeping, mopping, vacuuming      Lumbar Exercises: Stretches   Active Hamstring Stretch  Right;Left;2 reps;30 seconds    Active Hamstring Stretch Limitations  with strap    Single Knee to Chest Stretch  Left;1 rep;20 seconds      Lumbar Exercises: Aerobic   Nustep  L1 x 7 min      Lumbar Exercises: Supine   Ab Set  5 reps    AB Set Limitations  Good return demo    Clam  10 reps    Heel Slides  10 reps   Bil   Bent Knee Raise  10 reps    Bridge  10 reps    Bridge Limitations  Added to HEP  PT Short Term Goals - 11/16/19 1717      PT SHORT TERM GOAL #1   Title  ind with initial HEP    Time  4    Period  Weeks    Status  New    Target Date  12/14/19        PT Long Term Goals - 11/16/19 1238      PT LONG TERM GOAL #1   Title  The patient will be independent in safe self progression of HEP needed for further improvements in mobility and function  07/21/16    Time  8    Period  Weeks    Status  New    Target Date  01/11/20      PT LONG TERM GOAL #2   Title  Overall pain and discomfort will be < 3/10 with light yard work such as cleaning up branches    Time  8    Period  Weeks    Status  New    Target Date  01/11/20      PT LONG TERM GOAL #3   Title  FOTO < or = to 41% limited    Time  8    Period  Weeks    Status  New    Target Date  01/11/20      PT LONG TERM GOAL #4   Title  Patient will have improved hip IR/ER ROM adequate for future return to golf    Time  8    Period  Weeks    Status  New    Target Date  01/11/20      PT LONG TERM GOAL #5   Title  Patient will report overall pain and function improved by  > 70%    Time  8    Period  Weeks    Status  New    Target Date  01/11/20            Plan - 11/22/19 1021    Clinical Impression Statement  Pt arrives again painfree but does report having some Rt hip pain yesterday when doing housework. PTA educated pt on lumbar protective body mechanics for ADLS like sweeping, bending, vacuuming. Pt understood principles. Pt is compliant and independent in entire HEP. Bridging was added to HEP today but pt will need to work first on facilitating his glutes more as he tends to grip his hamstings too much.    Personal Factors and Comorbidities  Comorbidity 3+    Comorbidities  umbilical hernia; abdominal surgery; 2 back fusions    Stability/Clinical Decision Making  Stable/Uncomplicated    PT Frequency  2x / week    PT Duration  8 weeks    PT Treatment/Interventions  ADLs/Self Care Home Management;Biofeedback;Cryotherapy;Electrical Stimulation;Iontophoresis 4mg /ml Dexamethasone;Aquatic Therapy;Moist Heat;Ultrasound;Therapeutic exercise;Neuromuscular re-education;Therapeutic activities;Patient/family education;Manual techniques;Splinting;Energy conservation;Dry needling;Passive range of motion;Taping    PT Next Visit Plan  Review bridge ex, continue with core stabilization progression.    PT Home Exercise Plan  Access Code: [email or web address]    Consulted and Agree with Plan of Care  Patient       Patient will benefit from skilled therapeutic intervention in order to improve the following deficits and impairments:  Pain, Postural dysfunction, Decreased strength, Decreased mobility, Decreased coordination, Decreased range of motion  Visit Diagnosis: Muscle weakness (generalized)  Chronic bilateral low back pain, unspecified whether sciatica present  Unspecified lack of coordination     Problem List Patient Active Problem List   Diagnosis  Date Noted  . Calcification of aortic valve 09/20/2018  . Pre-operative cardiovascular examination 09/15/2018  .  Primary localized osteoarthritis of right knee   . Gastroesophageal reflux disease 08/26/2018  . Hernia of abdominal wall 08/26/2018  . 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 Acute meniscal tear of right knee 02/07/2018  . Complex tear of medial meniscus of right knee as current injury 02/04/2018  . Effusion of right knee 12/29/2017  . 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 Pure hypercholesterolemia 05/30/2015    Allicia Culley, PTA 11/22/2019, 11:02 AM  [facility] [address], [place], [place], [address] Phone: [phone]   Fax:  [phone]  Name: Jimmy Rangel MRN: [id number] Date of Birth: 03-Mar-1946

## 2019-11-27 ENCOUNTER — Ambulatory Visit: Payer: Medicare Other | Admitting: Physical Therapy

## 2019-11-30 ENCOUNTER — Encounter: Payer: Medicare Other | Admitting: Physical Therapy

## 2019-12-04 ENCOUNTER — Other Ambulatory Visit: Payer: Self-pay

## 2019-12-04 ENCOUNTER — Ambulatory Visit: Payer: Medicare Other | Admitting: Physical Therapy

## 2019-12-04 ENCOUNTER — Encounter: Payer: Self-pay | Admitting: Physical Therapy

## 2019-12-04 DIAGNOSIS — M545 Low back pain, unspecified: Secondary | ICD-10-CM

## 2019-12-04 DIAGNOSIS — R279 Unspecified lack of coordination: Secondary | ICD-10-CM

## 2019-12-04 DIAGNOSIS — M6281 Muscle weakness (generalized): Secondary | ICD-10-CM

## 2019-12-04 DIAGNOSIS — G8929 Other chronic pain: Secondary | ICD-10-CM

## 2019-12-04 NOTE — Patient Instructions (Signed)
Access Code: [email or web address]  URL: [email or web address]  Date: 12/04/2019  Prepared by: Jari Favre   Exercises Supine Hip Adductor Squeeze with Small Ball - 10 reps - 2 sets - 3 sec hold - 1x daily - 7x weekly Supine Single Knee to Chest Stretch - 5 reps - 1 sets - 10 sec hold                            - 2x daily - 7x weekly Supine Bilateral Hip Internal Rotation Stretch - 3 reps - 1 sets - 30 sec hold - 1x daily - 7x weekly Supine Double Knee to Chest - 5 reps - 1 sets - 10 sec hold - 1x daily - 7x weekly Standing Hamstring Stretch with Step - 3 reps - 1 sets - 30 sec hold - 1x daily - 7x weekly Gastroc Stretch on Wall - 3 reps - 1 sets - 30 sec hold - 1x daily - 7x weekly Supine Bridge - 10 reps - 1 sets - 5 hold - 1x daily - 7x weekly Wall Push Up - 10 reps - 3 sets - 1x daily - 7x weekly Seated Long Arc Quad - 10 reps - 3 sets - 1x daily - 7x weekly

## 2019-12-04 NOTE — Therapy (Signed)
[facility] [address], [place], [place], [address] Phone: [phone]   Fax:  [phone]  Physical Therapy Treatment  Patient Details  Name: Jimmy Rangel MRN: [id number] Date of Birth: [birth year] Referring Provider (PT): Jovita Gamma, MD   Encounter Date: 12/04/2019  PT End of Session - 12/04/19 1059    Visit Number  4    Date for PT Re-Evaluation  01/11/20    PT Start Time  1059    PT Stop Time  1140    PT Time Calculation (min)  41 min    Activity Tolerance  Patient tolerated treatment well    Behavior During Therapy  [facility] for tasks assessed/performed       Past Medical History:  Diagnosis Date  . Arthritis   . Calcification of aortic valve 09/20/2018   Aortic valve: Trileaflet; moderately thickened, moderately   calcified leaflets. Valve mobility was restricted.  . Chronic back pain    stenosis  . GERD (gastroesophageal reflux disease)    takes Omeprazole daily  . Headache    rare  . History of bronchitis as a child   . Hyperlipidemia    takes Atorvastatin daily  . Primary localized osteoarthritis of right knee   . Sleep apnea    uses CPAP, does not know settings  . Weakness    left leg    Past Surgical History:  Procedure Laterality Date  . BACK SURGERY     Lumbar fusion x2, 1997, 2016  . CHOLECYSTECTOMY    . COLONOSCOPY    . ESOPHAGOGASTRODUODENOSCOPY    . INJECTION KNEE Left 09/19/2018   Procedure: LEFT KNEE INJECTION;  Surgeon: Elsie Saas, MD;  Location: [place];  Service: Orthopedics;  Laterality: Left;  . KNEE ARTHROSCOPY Right   . spleenectomy    . TOTAL KNEE ARTHROPLASTY Right 09/19/2018   Procedure: TOTAL KNEE ARTHROPLASTY;  Surgeon: Elsie Saas, MD;  Location: [place];  Service: Orthopedics;  Laterality: Right;    There were no vitals filed for this visit.  Subjective Assessment - 12/04/19 1104    Subjective  I have been cutting up and removing a lot of trees.  I am  just sore all over, but muscle soreness.    Diagnostic tests  MRI and CT scan - broken screw from surgery but doesn't need surgery    Patient Stated Goals  strengthen core    Currently in Pain?  No/denies                       [facility] Adult PT Treatment/Exercise - 12/04/19 0001      Lumbar Exercises: Stretches   Single Knee to Chest Stretch  Left;1 rep;20 seconds      Lumbar Exercises: Aerobic   Nustep  L1 x 6 min at end of session      Lumbar Exercises: Standing   Other Standing Lumbar Exercises  wall push up - 15 x      Lumbar Exercises: Seated   Long Arc Quad on Chair  Strengthening;Both;20 reps    Other Seated Lumbar Exercises  lumbar flexion rolling ball - 5 x 10 sec      Lumbar Exercises: Supine   Bridge  10 reps;5 seconds    Other Supine Lumbar Exercises  seated rolling     Other Supine Lumbar Exercises  leg lengthener - 5 x 5 sec       Lumbar Exercises: Sidelying   Clam  Right;Left;15  reps      Knee/Hip Exercises: Stretches   Active Hamstring Stretch  Left;2 reps;30 seconds      Knee/Hip Exercises: Seated   Long Arc Quad  Strengthening;Right;Left;10 reps    Ball Squeeze  10 x 5 sec      Manual Therapy   Manual Therapy  Joint mobilization    Joint Mobilization  long axis distraction hip - 2 min each side             PT Education - 12/04/19 1147    Education Details  Access Code: [email or web address]    Person(s) Educated  Patient    Methods  Explanation;Demonstration;Handout;Verbal cues    Comprehension  Verbalized understanding;Returned demonstration       PT Short Term Goals - 12/04/19 1152      PT SHORT TERM GOAL #1   Title  ind with initial HEP    Status  Achieved        PT Long Term Goals - 11/16/19 1238      PT LONG TERM GOAL #1   Title  The patient will be independent in safe self progression of HEP needed for further improvements in mobility and function  07/21/16    Time  8    Period  Weeks    Status  New    Target Date   01/11/20      PT LONG TERM GOAL #2   Title  Overall pain and discomfort will be < 3/10 with light yard work such as cleaning up branches    Time  8    Period  Weeks    Status  New    Target Date  01/11/20      PT LONG TERM GOAL #3   Title  FOTO < or = to 41% limited    Time  8    Period  Weeks    Status  New    Target Date  01/11/20      PT LONG TERM GOAL #4   Title  Patient will have improved hip IR/ER ROM adequate for future return to golf    Time  8    Period  Weeks    Status  New    Target Date  01/11/20      PT LONG TERM GOAL #5   Title  Patient will report overall pain and function improved by > 70%    Time  8    Period  Weeks    Status  New    Target Date  01/11/20            Plan - 12/04/19 1148    Clinical Impression Statement  Pt arrives pain free but sore from doing clean up and tree removal over the past week.  Pt did well with exercise progressions.  He needed some cues to activate core and was reminded to be aware of lumbar region to ensure he is not overusing the muscles in the low back.  Pt got a good stretch with hip distraction and follow up with leg lengthener to maintain stretch at home.  Pt will benefit from skilled PT to work on funcitonal core strength.    PT Treatment/Interventions  ADLs/Self Care Home Management;Biofeedback;Cryotherapy;Electrical Stimulation;Iontophoresis 4mg /ml Dexamethasone;Aquatic Therapy;Moist Heat;Ultrasound;Therapeutic exercise;Neuromuscular re-education;Therapeutic activities;Patient/family education;Manual techniques;Splinting;Energy conservation;Dry needling;Passive range of motion;Taping    PT Next Visit Plan  progress with hip hinge technique and core stabilization    PT Home Exercise Plan  Access Code: [email or web address]  Consulted and Agree with Plan of Care  Patient       Patient will benefit from skilled therapeutic intervention in order to improve the following deficits and impairments:  Pain, Postural dysfunction,  Decreased strength, Decreased mobility, Decreased coordination, Decreased range of motion  Visit Diagnosis: Muscle weakness (generalized)  Chronic bilateral low back pain, unspecified whether sciatica present  Unspecified lack of coordination     Problem List Patient Active Problem List   Diagnosis Date Noted  . Calcification of aortic valve 09/20/2018  . Pre-operative cardiovascular examination 09/15/2018  . Primary localized osteoarthritis of right knee   . Gastroesophageal reflux disease 08/26/2018  . Hernia of abdominal wall 08/26/2018  . 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 Acute meniscal tear of right knee 02/07/2018  . Complex tear of medial meniscus of right knee as current injury 02/04/2018  . Effusion of right knee 12/29/2017  . 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 Pure hypercholesterolemia 05/30/2015    Jule Ser, PT 12/04/2019, 11:52 AM  [facility] [address] [place], [place], [address] Phone: [phone]   Fax:  [phone]  Name: Jimmy Rangel MRN: [id number] Date of Birth: March 27, 1946

## 2019-12-07 ENCOUNTER — Ambulatory Visit: Payer: Medicare Other | Admitting: Physical Therapy

## 2019-12-07 ENCOUNTER — Other Ambulatory Visit: Payer: Self-pay

## 2019-12-07 DIAGNOSIS — M6281 Muscle weakness (generalized): Secondary | ICD-10-CM

## 2019-12-07 DIAGNOSIS — M545 Low back pain: Secondary | ICD-10-CM | POA: Diagnosis not present

## 2019-12-07 DIAGNOSIS — G8929 Other chronic pain: Secondary | ICD-10-CM

## 2019-12-07 DIAGNOSIS — R279 Unspecified lack of coordination: Secondary | ICD-10-CM

## 2019-12-07 NOTE — Patient Instructions (Signed)
Access Code: [email or web address]  URL: [email or web address]  Date: 12/07/2019  Prepared by: Jari Favre   Exercises Supine Hip Adductor Squeeze with Small Ball - 10 reps - 2 sets - 3 sec hold - 1x daily - 7x weekly Supine Single Knee to Chest Stretch - 5 reps - 1 sets - 10 sec hold                            - 2x daily - 7x weekly Supine Bilateral Hip Internal Rotation Stretch - 3 reps - 1 sets - 30 sec hold - 1x daily - 7x weekly Supine Double Knee to Chest - 5 reps - 1 sets - 10 sec hold - 1x daily - 7x weekly Standing Hamstring Stretch with Step - 3 reps - 1 sets - 30 sec hold - 1x daily - 7x weekly Gastroc Stretch on Wall - 3 reps - 1 sets - 30 sec hold - 1x daily - 7x weekly Supine Bridge - 10 reps - 1 sets - 5 hold - 1x daily - 7x weekly Wall Push Up - 10 reps - 3 sets - 1x daily - 7x weekly Seated Long Arc Quad - 10 reps - 3 sets - 1x daily - 7x weekly Standing Hip Hinge with Dowel - 10 reps - 3 sets - 1x daily - 7x weekly

## 2019-12-07 NOTE — Therapy (Signed)
[facility] [address] [place], [place], [address] Phone: [phone]   Fax:  [phone]  Physical Therapy Treatment  Patient Details  Name: Jimmy Rangel MRN: [id number] Date of Birth: 18-Nov-1945 Referring Provider (PT): Jovita Gamma, MD   Encounter Date: 12/07/2019  PT End of Session - 12/07/19 0928    Visit Number  5    Date for PT Re-Evaluation  01/11/20    PT Start Time  0845    PT Stop Time  0927    PT Time Calculation (min)  42 min    Activity Tolerance  Patient tolerated treatment well    Behavior During Therapy  [facility] for tasks assessed/performed       Past Medical History:  Diagnosis Date  . Arthritis   . Calcification of aortic valve 09/20/2018   Aortic valve: Trileaflet; moderately thickened, moderately   calcified leaflets. Valve mobility was restricted.  . Chronic back pain    stenosis  . GERD (gastroesophageal reflux disease)    takes Omeprazole daily  . Headache    rare  . History of bronchitis as a child   . Hyperlipidemia    takes Atorvastatin daily  . Primary localized osteoarthritis of right knee   . Sleep apnea    uses CPAP, does not know settings  . Weakness    left leg    Past Surgical History:  Procedure Laterality Date  . BACK SURGERY     Lumbar fusion x2, 1997, 2016  . CHOLECYSTECTOMY    . COLONOSCOPY    . ESOPHAGOGASTRODUODENOSCOPY    . INJECTION KNEE Left 09/19/2018   Procedure: LEFT KNEE INJECTION;  Surgeon: Elsie Saas, MD;  Location: [place];  Service: Orthopedics;  Laterality: Left;  . KNEE ARTHROSCOPY Right   . spleenectomy    . TOTAL KNEE ARTHROPLASTY Right 09/19/2018   Procedure: TOTAL KNEE ARTHROPLASTY;  Surgeon: Elsie Saas, MD;  Location: [place];  Service: Orthopedics;  Laterality: Right;    There were no vitals filed for this visit.  Subjective Assessment - 12/07/19 0931    Subjective  Doing a lot of yard work and still doing well    Currently  in Pain?  No/denies                       [facility] Adult PT Treatment/Exercise - 12/07/19 0001      Lumbar Exercises: Stretches   Active Hamstring Stretch  Right;Left;3 reps    Single Knee to Chest Stretch  Left;1 rep;20 seconds      Lumbar Exercises: Standing   Other Standing Lumbar Exercises  hip hinge technique cues to keep back straight; add 6lb weight    Other Standing Lumbar Exercises  sliders back and side - 10x each      Lumbar Exercises: Supine   Clam  10 reps    Clam Limitations  blue    Bridge  10 reps;5 seconds   2 sets; one with ball squeeze; one with blue band   Straight Leg Raise  20 reps    Large Ball Abdominal Isometric Limitations  LE roll out - 30x      Knee/Hip Exercises: Aerobic   Elliptical  L0 x 2 min with TrA engaged              PT Education - 12/07/19 0928    Education Details  Access Code: [email or web address]    Person(s) Educated  Patient    Methods  Explanation;Demonstration;Handout;Verbal cues    Comprehension  Verbalized understanding;Returned demonstration       PT Short Term Goals - 12/04/19 1152      PT SHORT TERM GOAL #1   Title  ind with initial HEP    Status  Achieved        PT Long Term Goals - 11/16/19 1238      PT LONG TERM GOAL #1   Title  The patient will be independent in safe self progression of HEP needed for further improvements in mobility and function  07/21/16    Time  8    Period  Weeks    Status  New    Target Date  01/11/20      PT LONG TERM GOAL #2   Title  Overall pain and discomfort will be < 3/10 with light yard work such as cleaning up branches    Time  8    Period  Weeks    Status  New    Target Date  01/11/20      PT LONG TERM GOAL #3   Title  FOTO < or = to 41% limited    Time  8    Period  Weeks    Status  New    Target Date  01/11/20      PT LONG TERM GOAL #4   Title  Patient will have improved hip IR/ER ROM adequate for future return to golf    Time  8    Period  Weeks     Status  New    Target Date  01/11/20      PT LONG TERM GOAL #5   Title  Patient will report overall pain and function improved by > 70%    Time  8    Period  Weeks    Status  New    Target Date  01/11/20            Plan - 12/07/19 0929    Clinical Impression Statement  Pt was able to progress exercises today.  He has low tolerance on the eliptical due to soreness from doing a lot of yard work.  he reports he has not had any problem or pain lately.  Pt has fascial restrictions in lumbar spine that released with STM and MFR.  pt will benefit from skilled PT to continue to progress functional movements and core strength    PT Treatment/Interventions  ADLs/Self Care Home Management;Biofeedback;Cryotherapy;Electrical Stimulation;Iontophoresis 4mg /ml Dexamethasone;Aquatic Therapy;Moist Heat;Ultrasound;Therapeutic exercise;Neuromuscular re-education;Therapeutic activities;Patient/family education;Manual techniques;Splinting;Energy conservation;Dry needling;Passive range of motion;Taping    PT Next Visit Plan  progress with hip hinge technique and core stabilization    PT Home Exercise Plan  Access Code: [email or web address]    Consulted and Agree with Plan of Care  Patient       Patient will benefit from skilled therapeutic intervention in order to improve the following deficits and impairments:  Pain, Postural dysfunction, Decreased strength, Decreased mobility, Decreased coordination, Decreased range of motion  Visit Diagnosis: Muscle weakness (generalized)  Chronic bilateral low back pain, unspecified whether sciatica present  Unspecified lack of coordination     Problem List Patient Active Problem List   Diagnosis Date Noted  . Calcification of aortic valve 09/20/2018  . Pre-operative cardiovascular examination 09/15/2018  . Primary localized osteoarthritis of right knee   . Gastroesophageal reflux disease 08/26/2018  . Hernia of abdominal wall 08/26/2018  . History of splenectomy  08/26/2018  . Acute meniscal tear of right  knee 02/07/2018  . Complex tear of medial meniscus of right knee as current injury 02/04/2018  . Effusion of right knee 12/29/2017  . 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 Pure hypercholesterolemia 05/30/2015    Jule Ser, PT 12/07/2019, 10:13 AM  [facility] [address] [place], [place], [address] Phone: [phone]   Fax:  [phone]  Name: Jimmy Rangel MRN: [id number] Date of Birth: 11/06/1945

## 2019-12-11 ENCOUNTER — Ambulatory Visit: Payer: Medicare Other | Attending: Internal Medicine

## 2019-12-11 ENCOUNTER — Ambulatory Visit: Payer: Medicare Other | Admitting: Physical Therapy

## 2019-12-11 DIAGNOSIS — Z23 Encounter for immunization: Secondary | ICD-10-CM | POA: Insufficient documentation

## 2019-12-11 NOTE — Progress Notes (Signed)
   Covid-19 Vaccination Clinic  Name:  LORAL DEMARCHI    MRN: [id number] DOB: 02/28/1946  12/11/2019  Mr. Butikofer was observed post Covid-19 immunization for 15 minutes without incidence. He was provided with Vaccine Information Sheet and instruction to access the V-Safe system.   Mr. Goulas was instructed to call 911 with any severe reactions post vaccine: Marland Kitchen Difficulty breathing  . Swelling of your face and throat  . A fast heartbeat  . A bad rash all over your body  . Dizziness and weakness    Immunizations Administered    Name Date Dose VIS Date Route   Pfizer COVID-19 Vaccine 12/11/2019  8:34 AM 0.3 mL 09/22/2019 Intramuscular   Manufacturer: [place]   Lot: [id number]   [place]: [id number]

## 2019-12-14 ENCOUNTER — Ambulatory Visit: Payer: Medicare Other | Attending: Neurosurgery | Admitting: Physical Therapy

## 2019-12-14 ENCOUNTER — Encounter: Payer: Self-pay | Admitting: Physical Therapy

## 2019-12-14 ENCOUNTER — Other Ambulatory Visit: Payer: Self-pay

## 2019-12-14 DIAGNOSIS — M6281 Muscle weakness (generalized): Secondary | ICD-10-CM | POA: Insufficient documentation

## 2019-12-14 DIAGNOSIS — M545 Low back pain: Secondary | ICD-10-CM | POA: Insufficient documentation

## 2019-12-14 DIAGNOSIS — G8929 Other chronic pain: Secondary | ICD-10-CM | POA: Diagnosis present

## 2019-12-14 DIAGNOSIS — R279 Unspecified lack of coordination: Secondary | ICD-10-CM | POA: Insufficient documentation

## 2019-12-14 NOTE — Therapy (Signed)
[facility] [address] [place], [place], [address] Phone: [phone]   Fax:  [phone]  Physical Therapy Treatment  Patient Details  Name: Jimmy Rangel MRN: [id number] Date of Birth: 1946/03/17 Referring Provider (PT): Jovita Gamma, MD   Encounter Date: 12/14/2019  PT End of Session - 12/14/19 0849    Visit Number  6    Date for PT Re-Evaluation  01/11/20    PT Start Time  0845    PT Stop Time  0025    PT Time Calculation (min)  940 min    Activity Tolerance  Patient tolerated treatment well    Behavior During Therapy  [facility] for tasks assessed/performed       Past Medical History:  Diagnosis Date  . Actinic keratosis   . Arthritis   . Basal cell carcinoma 10/13/2006   left temple  . Basal cell carcinoma 10/13/2006   right anterior deltoid  . Basal cell carcinoma 08/16/2007   left distal posterior deltoid  . Basal cell carcinoma 07/31/2009   left zygomatic lat canthus   . Basal cell carcinoma 07/14/2010   right medial chest, EDC  . 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 Calcification of aortic valve 09/20/2018   Aortic valve: Trileaflet; moderately thickened, moderately   calcified leaflets. Valve mobility was restricted.  . Chronic back pain    stenosis  . GERD (gastroesophageal reflux disease)    takes Omeprazole daily  . Headache    rare  . History of bronchitis as a child   . Hyperlipidemia    takes Atorvastatin daily  . 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 Primary localized osteoarthritis of right knee   . Sleep apnea    uses CPAP, does not know settings  . Superficial basal cell carcinoma 08/16/2007   left distal anterior deltoid  . Superficial basal cell carcinoma 08/16/2007   left mid lateral bicep  . Superficial basal cell carcinoma 02/18/2009    right medial pretibial  . Superficial basal cell carcinoma 02/18/2009   right lateral calf  . Weakness    left leg    Past Surgical History:  Procedure Laterality Date  . BACK SURGERY     Lumbar fusion x2, 1997, 2016  . CHOLECYSTECTOMY    . COLONOSCOPY    . ESOPHAGOGASTRODUODENOSCOPY    . INJECTION KNEE Left 09/19/2018   Procedure: LEFT KNEE INJECTION;  Surgeon: Elsie Saas, MD;  Location: [place];  Service: Orthopedics;  Laterality: Left;  . KNEE ARTHROSCOPY Right   . spleenectomy    . TOTAL KNEE ARTHROPLASTY Right 09/19/2018   Procedure: TOTAL KNEE ARTHROPLASTY;  Surgeon: Elsie Saas, MD;  Location: [place];  Service: Orthopedics;  Laterality: Right;    There were no vitals filed for this visit.  Subjective Assessment - 12/14/19 0847    Subjective  Rt hamstring is sore today due to pulling treelimbs. Denies pain    Diagnostic tests  MRI and CT scan - broken screw from surgery but doesn't need surgery    Patient Stated Goals  strengthen core    Currently in Pain?  No/denies                       [facility] Adult PT Treatment/Exercise - 12/14/19 0001      Lumbar Exercises: Stretches   Active  Hamstring Stretch  Right;Left;3 reps    ITB Stretch  Right;Left;2 reps;20 seconds    Gastroc Stretch  Right;Left;3 reps;20 seconds      Lumbar Exercises: Aerobic   Nustep  L3 x 6 min PT present for status update      Knee/Hip Exercises: Standing   Hip Flexion  Stengthening;Right;Left;Knee bent;20 reps    Hip Flexion Limitations  2 lb    Hip Abduction  Stengthening;Both;10 reps;2 sets;Knee straight    Abduction Limitations  2 lb    Hip Extension  Stengthening;Both;2 sets;10 reps;Knee straight    Extension Limitations  2 lb      hip flexor stretch with side bend 2x 30 sec Wall slides - 20x         PT Short Term Goals - 12/04/19 1152      PT SHORT TERM GOAL #1   Title  ind with initial HEP    Status  Achieved        PT Long Term Goals - 12/14/19 1058       PT LONG TERM GOAL #1   Title  The patient will be independent in safe self progression of HEP needed for further improvements in mobility and function  07/21/16    Status  On-going      PT LONG TERM GOAL #2   Title  Overall pain and discomfort will be < 3/10 with light yard work such as cleaning up branches    Status  On-going      PT LONG TERM GOAL #3   Title  FOTO < or = to 41% limited    Status  On-going      PT LONG TERM GOAL #4   Title  Patient will have improved hip IR/ER ROM adequate for future return to golf    Status  On-going            Plan - 12/14/19 1015    Clinical Impression Statement  Pt tolerated exercises and reports feeling a little more loosened up after treatment.  He did well with progressing exercises.  Added IT band and TFL stretch today due to using this muscle a lot during single leg exercises.  Pt appears to be correctly engaging his core.  He will continue to benefit from core stength and posture    Comorbidities  umbilical hernia; abdominal surgery; 2 back fusions    PT Treatment/Interventions  ADLs/Self Care Home Management;Biofeedback;Cryotherapy;Electrical Stimulation;Iontophoresis 4mg /ml Dexamethasone;Aquatic Therapy;Moist Heat;Ultrasound;Therapeutic exercise;Neuromuscular re-education;Therapeutic activities;Patient/family education;Manual techniques;Splinting;Energy conservation;Dry needling;Passive range of motion;Taping    PT Next Visit Plan  progress with hip hinge technique and core stabilization; thoracic extension    PT Home Exercise Plan  Access Code: [email or web address]    Consulted and Agree with Plan of Care  Patient       Patient will benefit from skilled therapeutic intervention in order to improve the following deficits and impairments:  Pain, Postural dysfunction, Decreased strength, Decreased mobility, Decreased coordination, Decreased range of motion  Visit Diagnosis: Muscle weakness (generalized)  Chronic bilateral low back pain,  unspecified whether sciatica present  Unspecified lack of coordination     Problem List Patient Active Problem List   Diagnosis Date Noted  . Calcification of aortic valve 09/20/2018  . Pre-operative cardiovascular examination 09/15/2018  . Primary localized osteoarthritis of right knee   . Gastroesophageal reflux disease 08/26/2018  . Hernia of abdominal wall 08/26/2018  . 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 Acute meniscal tear of right knee 02/07/2018  . Complex  tear of medial meniscus of right knee as current injury 02/04/2018  . Effusion of right knee 12/29/2017  . 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 Pure hypercholesterolemia 05/30/2015    Jule Ser, PT 12/14/2019, 11:01 AM  [facility] [address], [place], [place], [address] Phone: [phone]   Fax:  [phone]  Name: KENNEITH MANDERVILLE MRN: [id number] Date of Birth: 11-Nov-1945

## 2019-12-18 ENCOUNTER — Encounter: Payer: Self-pay | Admitting: Physical Therapy

## 2019-12-18 ENCOUNTER — Ambulatory Visit: Payer: Medicare Other | Admitting: Physical Therapy

## 2019-12-18 ENCOUNTER — Other Ambulatory Visit: Payer: Self-pay

## 2019-12-18 DIAGNOSIS — M6281 Muscle weakness (generalized): Secondary | ICD-10-CM

## 2019-12-18 DIAGNOSIS — R279 Unspecified lack of coordination: Secondary | ICD-10-CM

## 2019-12-18 DIAGNOSIS — G8929 Other chronic pain: Secondary | ICD-10-CM

## 2019-12-18 NOTE — Therapy (Signed)
[facility] [address] [place], [place], [address] Phone: [phone]   Fax:  [phone]  Physical Therapy Treatment  Patient Details  Name: Jimmy Rangel MRN: [id number] Date of Birth: Oct 19, 1945 Referring Provider (PT): Jovita Gamma, MD   Encounter Date: 12/18/2019  PT End of Session - 12/18/19 1100    Visit Number  7    Date for PT Re-Evaluation  01/11/20    PT Start Time  1100    PT Stop Time  1155    PT Time Calculation (min)  55 min    Activity Tolerance  Patient tolerated treatment well    Behavior During Therapy  [facility] for tasks assessed/performed       Past Medical History:  Diagnosis Date  . Actinic keratosis   . Arthritis   . Basal cell carcinoma 10/13/2006   left temple  . Basal cell carcinoma 10/13/2006   right anterior deltoid  . Basal cell carcinoma 08/16/2007   left distal posterior deltoid  . Basal cell carcinoma 07/31/2009   left zygomatic lat canthus   . Basal cell carcinoma 07/14/2010   right medial chest, EDC  . 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 Calcification of aortic valve 09/20/2018   Aortic valve: Trileaflet; moderately thickened, moderately   calcified leaflets. Valve mobility was restricted.  . Chronic back pain    stenosis  . GERD (gastroesophageal reflux disease)    takes Omeprazole daily  . Headache    rare  . History of bronchitis as a child   . Hyperlipidemia    takes Atorvastatin daily  . 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 Primary localized osteoarthritis of right knee   . Sleep apnea    uses CPAP, does not know settings  . Superficial basal cell carcinoma 08/16/2007   left distal anterior deltoid  . Superficial basal cell carcinoma 08/16/2007   left mid lateral bicep  . Superficial basal cell carcinoma 02/18/2009   right medial pretibial  . Superficial basal cell carcinoma 02/18/2009   right lateral calf  . Weakness    left leg    Past Surgical History:  Procedure Laterality Date  . BACK SURGERY     Lumbar fusion x2, 1997, 2016  . CHOLECYSTECTOMY    . COLONOSCOPY    . ESOPHAGOGASTRODUODENOSCOPY    . INJECTION KNEE Left 09/19/2018   Procedure: LEFT KNEE INJECTION;  Surgeon: Elsie Saas, MD;  Location: [place];  Service: Orthopedics;  Laterality: Left;  . KNEE ARTHROSCOPY Right   . spleenectomy    . TOTAL KNEE ARTHROPLASTY Right 09/19/2018   Procedure: TOTAL KNEE ARTHROPLASTY;  Surgeon: Elsie Saas, MD;  Location: [place];  Service: Orthopedics;  Laterality: Right;    There were no vitals filed for this visit.  Subjective Assessment - 12/18/19 1104    Subjective  This weekend had a pain in the right upper hip , into the groin and down into the medial knee. Today it is better.    Pertinent History  Lt knee replacement Dec 2019; spleen removed in 2000    Diagnostic tests  MRI and CT scan - broken screw from surgery but doesn't need surgery    Currently in Pain?  No/denies    Multiple Pain Sites  No  [place] Adult PT Treatment/Exercise - 12/18/19 0001      Lumbar Exercises: Stretches   Other Lumbar Stretch Exercise  Standing hip flexor strtech at stairs  RT 3x LT 2x 20 sec       Moist Heat Therapy   Number Minutes Moist Heat  15 Minutes    Moist Heat Location  --   Rt ant hip     Electrical Stimulation   Electrical Stimulation Location  RT hip flexor group/proximal quad   Post manual work    Child psychotherapist Parameters  to tolerance in hooklying    Electrical Stimulation Goals  Pain      Manual Therapy   Manual Therapy  Soft tissue mobilization    Soft tissue mobilization  RT psoas compressions/release; sttatic stretching to Rt hip flexor group intermittently               PT Short Term  Goals - 12/04/19 1152      PT SHORT TERM GOAL #1   Title  ind with initial HEP    Status  Achieved        PT Long Term Goals - 12/14/19 1058      PT LONG TERM GOAL #1   Title  The patient will be independent in safe self progression of HEP needed for further improvements in mobility and function  07/21/16    Status  On-going      PT LONG TERM GOAL #2   Title  Overall pain and discomfort will be < 3/10 with light yard work such as cleaning up branches    Status  On-going      PT LONG TERM GOAL #3   Title  FOTO < or = to 41% limited    Status  On-going      PT LONG TERM GOAL #4   Title  Patient will have improved hip IR/ER ROM adequate for future return to golf    Status  On-going            Plan - 12/18/19 1129    Clinical Impression Statement  Pt reports having a lot of "issues" ( pain ) throughout the weekend along the Rt anterior hip. down into the medial thigh and into the medial knee. He presents today with none of this pain but it quite tender along the Rt ant hip group and proximal quad. PTA provided manual release techniques to aide muscle in relaxation followed by long, static stretches. Pt reports "feeling it" but felt like it "needed to be done." PT afollowed the session with Estim and heat to the area worked for post rehab soreness. reduction. Post therapy pt reports    Personal Factors and Comorbidities  Comorbidity 3+    Comorbidities  umbilical hernia; abdominal surgery; 2 back fusions    Stability/Clinical Decision Making  Stable/Uncomplicated    PT Frequency  2x / week    PT Duration  8 weeks    PT Treatment/Interventions  ADLs/Self Care Home Management;Biofeedback;Cryotherapy;Electrical Stimulation;Iontophoresis 4mg /ml Dexamethasone;Aquatic Therapy;Moist Heat;Ultrasound;Therapeutic exercise;Neuromuscular re-education;Therapeutic activities;Patient/family education;Manual techniques;Splinting;Energy conservation;Dry needling;Passive range of motion;Taping     PT Next Visit Plan  Assess pt's anterior hip pain and address that if needed, then follow up with hip hinging and thoracic extension.    PT Home Exercise Plan  Access Code: [email or web address]    Consulted and Agree with Plan of Care  Patient       Patient will benefit from skilled therapeutic  intervention in order to improve the following deficits and impairments:  Pain, Postural dysfunction, Decreased strength, Decreased mobility, Decreased coordination, Decreased range of motion  Visit Diagnosis: Chronic bilateral low back pain, unspecified whether sciatica present  Muscle weakness (generalized)  Unspecified lack of coordination     Problem List Patient Active Problem List   Diagnosis Date Noted  . Calcification of aortic valve 09/20/2018  . Pre-operative cardiovascular examination 09/15/2018  . Primary localized osteoarthritis of right knee   . Gastroesophageal reflux disease 08/26/2018  . Hernia of abdominal wall 08/26/2018  . 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 Acute meniscal tear of right knee 02/07/2018  . Complex tear of medial meniscus of right knee as current injury 02/04/2018  . Effusion of right knee 12/29/2017  . 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 Pure hypercholesterolemia 05/30/2015    Keaghan Bowens, PTA 12/18/2019, 11:45 AM  [facility] [address], [place], [place], [address] Phone: [phone]   Fax:  [phone]  Name: Jimmy Rangel MRN: [id number] Date of Birth: June 29, 1946

## 2019-12-21 ENCOUNTER — Encounter: Payer: Self-pay | Admitting: Physical Therapy

## 2019-12-21 ENCOUNTER — Ambulatory Visit: Payer: Medicare Other | Admitting: Physical Therapy

## 2019-12-21 ENCOUNTER — Other Ambulatory Visit: Payer: Self-pay

## 2019-12-21 DIAGNOSIS — M6281 Muscle weakness (generalized): Secondary | ICD-10-CM | POA: Diagnosis not present

## 2019-12-21 DIAGNOSIS — G8929 Other chronic pain: Secondary | ICD-10-CM

## 2019-12-21 DIAGNOSIS — M545 Low back pain, unspecified: Secondary | ICD-10-CM

## 2019-12-21 DIAGNOSIS — R279 Unspecified lack of coordination: Secondary | ICD-10-CM

## 2019-12-21 NOTE — Therapy (Signed)
[facility] [address], [place], [place], [address] Phone: [phone]   Fax:  [phone]  Physical Therapy Treatment  Patient Details  Name: Jimmy Rangel MRN: [id number] Date of Birth: 1946/05/24 Referring Provider (PT): Jovita Gamma, MD   Encounter Date: 12/21/2019  PT End of Session - 12/21/19 0850    Visit Number  8    Date for PT Re-Evaluation  01/11/20    PT Start Time  0845    PT Stop Time  0925    PT Time Calculation (min)  40 min    Activity Tolerance  Patient tolerated treatment well    Behavior During Therapy  [facility] for tasks assessed/performed       Past Medical History:  Diagnosis Date  . Actinic keratosis   . Arthritis   . Basal cell carcinoma 10/13/2006   left temple  . Basal cell carcinoma 10/13/2006   right anterior deltoid  . Basal cell carcinoma 08/16/2007   left distal posterior deltoid  . Basal cell carcinoma 07/31/2009   left zygomatic lat canthus   . Basal cell carcinoma 07/14/2010   right medial chest, EDC  . 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 Calcification of aortic valve 09/20/2018   Aortic valve: Trileaflet; moderately thickened, moderately   calcified leaflets. Valve mobility was restricted.  . Chronic back pain    stenosis  . GERD (gastroesophageal reflux disease)    takes Omeprazole daily  . Headache    rare  . History of bronchitis as a child   . Hyperlipidemia    takes Atorvastatin daily  . 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 Primary localized osteoarthritis of right knee   . Sleep apnea    uses CPAP, does not know settings  . Superficial basal cell carcinoma 08/16/2007   left distal anterior deltoid  . Superficial basal cell carcinoma 08/16/2007   left mid lateral bicep  . Superficial basal cell carcinoma 02/18/2009   right medial pretibial  . Superficial basal cell carcinoma 02/18/2009   right lateral calf  . Weakness    left leg    Past Surgical History:  Procedure Laterality Date  . BACK SURGERY     Lumbar fusion x2, 1997, 2016  . CHOLECYSTECTOMY    . COLONOSCOPY    . ESOPHAGOGASTRODUODENOSCOPY    . INJECTION KNEE Left 09/19/2018   Procedure: LEFT KNEE INJECTION;  Surgeon: Elsie Saas, MD;  Location: [place];  Service: Orthopedics;  Laterality: Left;  . KNEE ARTHROSCOPY Right   . spleenectomy    . TOTAL KNEE ARTHROPLASTY Right 09/19/2018   Procedure: TOTAL KNEE ARTHROPLASTY;  Surgeon: Elsie Saas, MD;  Location: [place];  Service: Orthopedics;  Laterality: Right;    There were no vitals filed for this visit.  Subjective Assessment - 12/21/19 0928    Subjective  No pain since last visit.  I feel 85-90% better    Currently in Pain?  No/denies                       [facility] Adult PT Treatment/Exercise - 12/21/19 0001      Knee/Hip Exercises: Standing   Hip ADduction  Strengthening;Right;Left;15 reps   yellow band   Other Standing Knee Exercises  squat with gluteal activation      Knee/Hip Exercises: Supine   Constance Haw  Strengthening;Both;10 reps   band   Bridges with [employer]  Strengthening;Both;10 reps      Knee/Hip Exercises: Sidelying   Hip ABduction  Strengthening;Both;10 reps    Hip ADduction  Strengthening;Both;10 reps      Manual Therapy   Soft tissue mobilization  Rt psoas and adductors               PT Short Term Goals - 12/04/19 1152      PT SHORT TERM GOAL #1   Title  ind with initial HEP    Status  Achieved        PT Long Term Goals - 12/14/19 1058      PT LONG TERM GOAL #1   Title  The patient will be independent in safe self progression of HEP needed for further improvements in mobility and function  07/21/16    Status  On-going      PT LONG TERM GOAL #2   Title  Overall pain and discomfort will be < 3/10 with light yard work  such as cleaning up branches    Status  On-going      PT LONG TERM GOAL #3   Title  FOTO < or = to 41% limited    Status  On-going      PT LONG TERM GOAL #4   Title  Patient will have improved hip IR/ER ROM adequate for future return to golf    Status  On-going            Plan - 12/21/19 0929    Clinical Impression Statement  Pt feels 85-90% better overall.  pt continues to have some weakness in bilaeral hips Rt>Lt and has TTP to adductor insertion and hip flexor on that side. Pt  did well with exercises and demonstrates good gluteal activation with squats today.    PT Treatment/Interventions  ADLs/Self Care Home Management;Biofeedback;Cryotherapy;Electrical Stimulation;Iontophoresis 4mg /ml Dexamethasone;Aquatic Therapy;Moist Heat;Ultrasound;Therapeutic exercise;Neuromuscular re-education;Therapeutic activities;Patient/family education;Manual techniques;Splinting;Energy conservation;Dry needling;Passive range of motion;Taping    PT Next Visit Plan  re-assess strength/ FOTO and goals    PT Home Exercise Plan  Access Code: [email or web address]    Consulted and Agree with Plan of Care  Patient       Patient will benefit from skilled therapeutic intervention in order to improve the following deficits and impairments:  Pain, Postural dysfunction, Decreased strength, Decreased mobility, Decreased coordination, Decreased range of motion  Visit Diagnosis: Chronic bilateral low back pain, unspecified whether sciatica present  Muscle weakness (generalized)  Unspecified lack of coordination     Problem List Patient Active Problem List   Diagnosis Date Noted  . Calcification of aortic valve 09/20/2018  . Pre-operative cardiovascular examination 09/15/2018  . Primary localized osteoarthritis of right knee   . Gastroesophageal reflux disease 08/26/2018  . Hernia of abdominal wall 08/26/2018  . 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 Acute meniscal tear of right knee 02/07/2018  . Complex tear of  medial meniscus of right knee as current injury 02/04/2018  . Effusion of right knee 12/29/2017  . 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 Pure hypercholesterolemia 05/30/2015    Jule Ser, PT 12/21/2019, 9:49 AM  [facility] [address] [place], [place], [address] Phone: [phone]   Fax:  [phone]  Name: KALEY MANJARRES MRN: [id number] Date of Birth: 1946/03/23

## 2019-12-26 ENCOUNTER — Encounter: Payer: Self-pay | Admitting: Physical Therapy

## 2019-12-26 ENCOUNTER — Other Ambulatory Visit: Payer: Self-pay

## 2019-12-26 ENCOUNTER — Ambulatory Visit: Payer: Medicare Other | Admitting: Physical Therapy

## 2019-12-26 DIAGNOSIS — M6281 Muscle weakness (generalized): Secondary | ICD-10-CM

## 2019-12-26 DIAGNOSIS — M545 Low back pain, unspecified: Secondary | ICD-10-CM

## 2019-12-26 DIAGNOSIS — R279 Unspecified lack of coordination: Secondary | ICD-10-CM

## 2019-12-26 DIAGNOSIS — G8929 Other chronic pain: Secondary | ICD-10-CM

## 2019-12-26 NOTE — Therapy (Addendum)
[facility] [address], [place], [place], [address] Phone: [phone]   Fax:  [phone]  Physical Therapy Treatment  Patient Details  Name: Jimmy Rangel MRN: [id number] Date of Birth: 08/04/1946 Referring Provider (PT): Jovita Gamma, MD   Encounter Date: 12/26/2019  PT End of Session - 12/26/19 0853    Visit Number  9    Date for PT Re-Evaluation  01/11/20    PT Start Time  0846    PT Stop Time  0925    PT Time Calculation (min)  39 min    Activity Tolerance  Patient tolerated treatment well    Behavior During Therapy  [facility] for tasks assessed/performed       Past Medical History:  Diagnosis Date  . Actinic keratosis   . Arthritis   . Basal cell carcinoma 10/13/2006   left temple  . Basal cell carcinoma 10/13/2006   right anterior deltoid  . Basal cell carcinoma 08/16/2007   left distal posterior deltoid  . Basal cell carcinoma 07/31/2009   left zygomatic lat canthus   . Basal cell carcinoma 07/14/2010   right medial chest, EDC  . 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 Calcification of aortic valve 09/20/2018   Aortic valve: Trileaflet; moderately thickened, moderately   calcified leaflets. Valve mobility was restricted.  . Chronic back pain    stenosis  . GERD (gastroesophageal reflux disease)    takes Omeprazole daily  . Headache    rare  . History of bronchitis as a child   . Hyperlipidemia    takes Atorvastatin daily  . 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 Primary localized osteoarthritis of right knee   . Sleep apnea    uses CPAP, does not know settings  . Superficial basal cell carcinoma 08/16/2007   left distal anterior deltoid  . Superficial basal cell carcinoma 08/16/2007   left mid lateral bicep  . Superficial basal cell carcinoma 02/18/2009   right medial pretibial  . Superficial basal cell carcinoma 02/18/2009   right lateral calf  . Weakness    left leg    Past Surgical History:  Procedure Laterality Date  . BACK SURGERY     Lumbar fusion x2, 1997, 2016  . CHOLECYSTECTOMY    . COLONOSCOPY    . ESOPHAGOGASTRODUODENOSCOPY    . INJECTION KNEE Left 09/19/2018   Procedure: LEFT KNEE INJECTION;  Surgeon: Elsie Saas, MD;  Location: [place];  Service: Orthopedics;  Laterality: Left;  . KNEE ARTHROSCOPY Right   . spleenectomy    . TOTAL KNEE ARTHROPLASTY Right 09/19/2018   Procedure: TOTAL KNEE ARTHROPLASTY;  Surgeon: Elsie Saas, MD;  Location: [place];  Service: Orthopedics;  Laterality: Right;    There were no vitals filed for this visit.  Subjective Assessment - 12/26/19 0852    Subjective  I have been doing well.  No pain since previous visit.    Patient Stated Goals  strengthen core    Currently in Pain?  No/denies         [facility] PT Assessment - 12/26/19 0001      Assessment   Medical Diagnosis  M54.42 (ICD-10-CM) - Lumbago with sciatica, left side    Referring Provider (PT)  Jovita Gamma, MD      Observation/Other Assessments   Focus on Therapeutic Outcomes (FOTO)   25%  limited                   [place] Adult PT Treatment/Exercise - 12/26/19 0001      Lumbar Exercises: Stretches   Active Hamstring Stretch  Right;Left;3 reps    Hip Flexor Stretch  Right;Left;2 reps;30 seconds    Gastroc Stretch  Right;Left;3 reps;20 seconds      Lumbar Exercises: Aerobic   Nustep  L3 x 10 min PT present for status update      Lumbar Exercises: Standing   Other Standing Lumbar Exercises  wall plank with pball - 20x up and down      Knee/Hip Exercises: Seated   Abd/Adduction Limitations  hip IR/ER seated with red band - 20x               PT Short Term Goals - 12/04/19 1152      PT SHORT TERM GOAL #1   Title  ind with initial HEP    Status  Achieved        PT Long Term Goals - 12/26/19  0907      PT LONG TERM GOAL #2   Title  Overall pain and discomfort will be < 3/10 with light yard work such as cleaning up branches    Baseline  Pt states he can pick limbs and branches and no pain afterwards    Status  Achieved      PT LONG TERM GOAL #3   Title  FOTO < or = to 41% limited      PT LONG TERM GOAL #4   Title  Patient will have improved hip IR/ER ROM adequate for future return to golf    Baseline  hasn't tried    Status  Deferred      PT LONG TERM GOAL #5   Title  Patient will report overall pain and function improved by > 70%    Baseline  at least 85%    Status  Achieved            Plan - 12/26/19 0926    Clinical Impression Statement  Pt has met most of his goals other than getting back to golf.  He is ind with his HEP and has imporved his FOTO score by more than 50%.  Pt will discharge today.    PT Treatment/Interventions  ADLs/Self Care Home Management;Biofeedback;Cryotherapy;Electrical Stimulation;Iontophoresis 75m/ml Dexamethasone;Aquatic Therapy;Moist Heat;Ultrasound;Therapeutic exercise;Neuromuscular re-education;Therapeutic activities;Patient/family education;Manual techniques;Splinting;Energy conservation;Dry needling;Passive range of motion;Taping    PT Next Visit Plan  re-assess strength/ FOTO and goals    PT Home Exercise Plan  Access Code: [email or web address]    Consulted and Agree with Plan of Care  Patient       Patient will benefit from skilled therapeutic intervention in order to improve the following deficits and impairments:  Pain, Postural dysfunction, Decreased strength, Decreased mobility, Decreased coordination, Decreased range of motion  Visit Diagnosis: Chronic bilateral low back pain, unspecified whether sciatica present  Muscle weakness (generalized)  Unspecified lack of coordination     Problem List Patient Active Problem List   Diagnosis Date Noted  . Calcification of aortic valve 09/20/2018  . Pre-operative cardiovascular  examination 09/15/2018  . Primary localized osteoarthritis of right knee   . Gastroesophageal reflux disease 08/26/2018  . Hernia of abdominal wall 08/26/2018  . 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 Acute meniscal tear of right knee 02/07/2018  . Complex tear of medial meniscus of right knee as current injury 02/04/2018  . Effusion of  right knee 12/29/2017  . 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 Pure hypercholesterolemia 05/30/2015    Jule Ser, PT 12/26/2019, 10:03 AM  [facility] [address] [place], [place], [address] Phone: [phone]   Fax:  [phone]  Name: Jimmy Rangel MRN: [id number] Date of Birth: 24-Sep-1946  PHYSICAL THERAPY DISCHARGE SUMMARY  Visits from Start of Care: 9  Current functional level related to goals / functional outcomes: See above goals   Remaining deficits: See above   Education / Equipment: HEP  Plan: Patient agrees to discharge.  Patient goals were met. Patient is being discharged due to meeting the stated rehab goals.  ?????    [employer], PT 12/26/19 10:03 AM

## 2020-01-02 ENCOUNTER — Ambulatory Visit: Payer: Medicare Other | Admitting: Dermatology

## 2020-01-02 ENCOUNTER — Other Ambulatory Visit: Payer: Self-pay

## 2020-01-02 DIAGNOSIS — L570 Actinic keratosis: Secondary | ICD-10-CM

## 2020-01-02 DIAGNOSIS — D485 Neoplasm of uncertain behavior of skin: Secondary | ICD-10-CM

## 2020-01-02 DIAGNOSIS — Z85828 Personal history of other malignant neoplasm of skin: Secondary | ICD-10-CM | POA: Diagnosis not present

## 2020-01-02 DIAGNOSIS — L821 Other seborrheic keratosis: Secondary | ICD-10-CM | POA: Diagnosis not present

## 2020-01-02 DIAGNOSIS — B079 Viral wart, unspecified: Secondary | ICD-10-CM | POA: Diagnosis not present

## 2020-01-02 DIAGNOSIS — Z1283 Encounter for screening for malignant neoplasm of skin: Secondary | ICD-10-CM

## 2020-01-02 DIAGNOSIS — C44319 Basal cell carcinoma of skin of other parts of face: Secondary | ICD-10-CM

## 2020-01-02 DIAGNOSIS — L578 Other skin changes due to chronic exposure to nonionizing radiation: Secondary | ICD-10-CM | POA: Diagnosis not present

## 2020-01-02 DIAGNOSIS — C44719 Basal cell carcinoma of skin of left lower limb, including hip: Secondary | ICD-10-CM | POA: Diagnosis not present

## 2020-01-02 DIAGNOSIS — L820 Inflamed seborrheic keratosis: Secondary | ICD-10-CM

## 2020-01-02 NOTE — Progress Notes (Signed)
Follow-Up Visit   Subjective  Jimmy Rangel is a 74 y.o. male who presents for the following: Spots (Face, some are itchy and irritated. Spot on left lower leg x 3-4 mos.) and Follow-up (Hx AKs, Hx BCCs).    The following portions of the chart were reviewed this encounter and updated as appropriate:     Review of Systems: No other skin or systemic complaints.  Objective  Well appearing patient in no apparent distress; mood and affect are within normal limits.  A focused examination was performed including upper body, left lower leg. Relevant physical exam findings are noted in the Assessment and Plan.  Objective  Right cheek x 1, R nasal tip x 1, R upper eyebrow x 1, R temple x 3, Forehead x 8, Left temple x 1, L mandible x 1, R upper back x 1, L shoulder x 1 (18): Erythematous thin papules/macules with gritty scale.   Objective  Face, arms: Diffuse scaly erythematous macules with underlying dyspigmentation.   Objective  Trunk, extremities, R nasolabial: Well healed scar with no evidence of recurrence.   Objective  Right Spinal Upper Back x 1, L lower neck x 1 (2): Erythematous keratotic waxy stuck-on papule. Itchy  Objective  Left Sideburn: 7.0 x 5.0 mm pink pearly papule with scale     Objective  Left Pretibia: 1.2 cm pink crusted papule        Objective  Face, trunk: Stuck-on, waxy, tan-brown papule or plaque --Discussed benign etiology and prognosis.   Objective  Right Infraocular: Filiform papule.  Assessment & Plan  AK (actinic keratosis) (18) Right cheek x 1, R nasal tip x 1, R upper eyebrow x 1, R temple x 3, Forehead x 8, Left temple x 1, L mandible x 1, R upper back x 1, L shoulder x 1  Discussed PDT to face x 2, 1 month apart. Pt may schedule in fall.  Destruction of lesion - Right cheek x 1, R nasal tip x 1, R upper eyebrow x 1, R temple x 3, Forehead x 8, Left temple x 1, L mandible x 1, R upper back x 1, L shoulder x 1 Complexity:  extensive   Destruction method: cryotherapy   Informed consent: discussed and consent obtained   Lesion destroyed using liquid nitrogen: Yes   Region frozen until ice ball extended beyond lesion: Yes   Outcome: patient tolerated procedure well with no complications   Post-procedure details: wound care instructions given    Actinic skin damage Face, arms  Recommend daily use of broad spectrum spf 30+ sunscreen to sun-exposed areas.   History of basal cell carcinoma (BCC) Trunk, extremities, R nasolabial  Clear. Observe for recurrence. Call clinic for new or changing lesions.  Recommend regular skin exams, broad-spectrum spf 30+ sunscreen use, and photoprotection.     Inflamed seborrheic keratosis (2) Right Spinal Upper Back x 1, L lower neck x 1  Recheck L lower neck on follow-up.  Destruction of lesion - Right Spinal Upper Back x 1, L lower neck x 1  Destruction method: cryotherapy   Informed consent: discussed and consent obtained   Lesion destroyed using liquid nitrogen: Yes   Region frozen until ice ball extended beyond lesion: Yes   Outcome: patient tolerated procedure well with no complications   Post-procedure details: wound care instructions given    Neoplasm of uncertain behavior of skin (2) Left Sideburn  Skin / nail biopsy Type of biopsy: tangential   Informed consent: discussed and  consent obtained   Patient was prepped and draped in usual sterile fashion: Area prepped with alcohol. Anesthesia: the lesion was anesthetized in a standard fashion   Anesthetic:  1% lidocaine w/ epinephrine 1-100,000 buffered w/ 8.4% NaHCO3 Instrument used: flexible razor blade   Hemostasis achieved with: pressure, aluminum chloride and electrodesiccation   Outcome: patient tolerated procedure well   Post-procedure details: wound care instructions given   Post-procedure details comment:  Ointment and small bandage applied  Specimen 1 - Surgical pathology Differential Diagnosis:  Inflamed SK r/o BCC Check Margins: No 7.0 x 5.0 mm pink pearly papule   Left Pretibia  Skin / nail biopsy Type of biopsy: tangential   Informed consent: discussed and consent obtained   Patient was prepped and draped in usual sterile fashion: Area prepped with alcohol. Anesthesia: the lesion was anesthetized in a standard fashion   Anesthetic:  1% lidocaine w/ epinephrine 1-100,000 buffered w/ 8.4% NaHCO3 Instrument used: flexible razor blade   Hemostasis achieved with: pressure, aluminum chloride and electrodesiccation   Outcome: patient tolerated procedure well   Post-procedure details: wound care instructions given   Post-procedure details comment:  Ointment and small bandage applied  Specimen 2 - Surgical pathology Differential Diagnosis: Inflamed SK r/o SCC Check Margins: No 1.2 cm pink crusted papule   Seborrheic keratosis Face, trunk  Benign, observe.    Skin cancer screening UBSE  Viral warts, unspecified type Right Infraocular  Destruction of lesion - Right Infraocular  Destruction method: cryotherapy   Informed consent: discussed and consent obtained   Lesion destroyed using liquid nitrogen: Yes   Region frozen until ice ball extended beyond lesion: Yes   Outcome: patient tolerated procedure well with no complications   Post-procedure details: wound care instructions given    Return in about 6 months (around 07/04/2020) for f/up AKs, skin ca, PDT to face.   [name], CMA, am acting as scribe for [name], MD .

## 2020-01-02 NOTE — Patient Instructions (Addendum)
Wound Care Instructions  1. Cleanse would gently with soap and water once a day then pat dry with clean gauze. Apply a thing coat of Petrolatum (petroleum jelly, "Vaseline") over the wound (unless you have an allergy to this). We recommend that you use a new, sterile tube of Vaseline. Do not pick or remove scabs. Do not remove the yellow or white "healing tissue" from the base of the wound.  2. Cover the wound with fresh, clean, nonstick gauze and secure with paper tape. You may use Band-Aids in place of gauze and tape if the would is small enough, but would recommend trimming much of the tape off as there is often too much. Sometimes Band-Aids can irritate the skin.  3. You should call the office for your biopsy report after 1 week if you have not already been contacted.  4. If you experience any problems, such as abnormal amounts of bleeding, swelling, significant bruising, significant pain, or evidence of infection, please call the office immediately.  5. FOR ADULT SURGERY PATIENTS: If you need something for pain relief you may take 1 extra strength Tylenol (acetaminophen) AND 2 Ibuprofen (200mg  each) together every 4 hours as needed for pain. (do not take these if you are allergic to them or if you have a reason you should not take them.) Typically, you may only need pain medication for 1 to 3 days.     Cryotherapy Aftercare  . Wash gently with soap and water everyday.   Marland Kitchen Apply Vaseline and Band-Aid daily until healed.   Recommend daily use of broad spectrum spf 30+ sunscreen to sun-exposed areas.

## 2020-01-08 ENCOUNTER — Telehealth: Payer: Self-pay

## 2020-01-08 NOTE — Telephone Encounter (Signed)
Advised pt of bx results and scheduled pt for surgery 02/26/20 at 3:30.  Advised pt that Dr. Nicole Kindred would txt the [facility] on his L pretibia at his post op appt./sh

## 2020-01-08 NOTE — Telephone Encounter (Signed)
-----   Message from [name], MD sent at 01/05/2020  8:49 AM EDT ----- 1. Skin , left sideburn BASAL CELL CARCINOMA, NODULAR PATTERN- needs excision 2. Skin , left pretibia BASAL CELL CARCINOMA, NODULAR AND INFILTRATIVE PATTERNS- needs EDC

## 2020-01-09 ENCOUNTER — Ambulatory Visit: Payer: Medicare Other | Attending: Internal Medicine

## 2020-01-09 DIAGNOSIS — Z23 Encounter for immunization: Secondary | ICD-10-CM

## 2020-01-09 NOTE — Progress Notes (Signed)
   Covid-19 Vaccination Clinic  Name:  Jimmy Rangel    MRN: [id number] DOB: 21-Sep-1946  01/09/2020  Jimmy Rangel was observed post Covid-19 immunization for 15 minutes without incident. He was provided with Vaccine Information Sheet and instruction to access the V-Safe system.   Jimmy Rangel was instructed to call 911 with any severe reactions post vaccine: Marland Kitchen Difficulty breathing  . Swelling of face and throat  . A fast heartbeat  . A bad rash all over body  . Dizziness and weakness   Immunizations Administered    Name Date Dose VIS Date Route   Pfizer COVID-19 Vaccine 01/09/2020  8:48 AM 0.3 mL 09/22/2019 Intramuscular   Manufacturer: [place]   Lot: [id number]   [place]: [id number]

## 2020-02-26 ENCOUNTER — Other Ambulatory Visit: Payer: Self-pay

## 2020-02-26 ENCOUNTER — Encounter: Payer: Self-pay | Admitting: Dermatology

## 2020-02-26 ENCOUNTER — Ambulatory Visit: Payer: Medicare Other | Admitting: Dermatology

## 2020-02-26 DIAGNOSIS — L578 Other skin changes due to chronic exposure to nonionizing radiation: Secondary | ICD-10-CM

## 2020-02-26 DIAGNOSIS — C44719 Basal cell carcinoma of skin of left lower limb, including hip: Secondary | ICD-10-CM

## 2020-02-26 DIAGNOSIS — C44319 Basal cell carcinoma of skin of other parts of face: Secondary | ICD-10-CM | POA: Diagnosis not present

## 2020-02-26 DIAGNOSIS — L821 Other seborrheic keratosis: Secondary | ICD-10-CM

## 2020-02-26 DIAGNOSIS — L820 Inflamed seborrheic keratosis: Secondary | ICD-10-CM | POA: Diagnosis not present

## 2020-02-26 NOTE — Patient Instructions (Signed)
Cryotherapy Aftercare  . Wash gently with soap and water everyday.   . Apply Vaseline and Band-Aid daily until healed.   Wound Care Instructions  1. Cleanse wound gently with soap and water once a day then pat dry with clean gauze. Apply a thing coat of Petrolatum (petroleum jelly, "Vaseline") over the wound (unless you have an allergy to this). We recommend that you use a new, sterile tube of Vaseline. Do not pick or remove scabs. Do not remove the yellow or white "healing tissue" from the base of the wound.  2. Cover the wound with fresh, clean, nonstick gauze and secure with paper tape. You may use Band-Aids in place of gauze and tape if the would is small enough, but would recommend trimming much of the tape off as there is often too much. Sometimes Band-Aids can irritate the skin.  3. If you experience any problems, such as abnormal amounts of bleeding, swelling, significant bruising, significant pain, or evidence of infection, please call the office immediately.      

## 2020-02-26 NOTE — Progress Notes (Signed)
Follow-Up Visit   Subjective  Jimmy Rangel is a 74 y.o. male who presents for the following: Procedure (BCC of the left sideburn and left pretibia, biopsy proven.).  EDC today both sites.  Also he has itchy spots on scalp he would like removed.   The following portions of the chart were reviewed this encounter and updated as appropriate:      Review of Systems:  No other skin or systemic complaints except as noted in HPI or Assessment and Plan.  Objective  Well appearing patient in no apparent distress; mood and affect are within normal limits.  A focused examination was performed including face. Relevant physical exam findings are noted in the Assessment and Plan.  Objective  Left Pretibia: Pink biopsy site.  Objective  Left Sideburn: Pink biopsy site.  Objective  Crown x 3, Frontal Scalp x 1 (4): Erythematous keratotic waxy stuck-on papules    Assessment & Plan   Actinic Damage - diffuse scaly erythematous macules with underlying dyspigmentation - Recommend daily broad spectrum sunscreen SPF 30+ to sun-exposed areas, reapply every 2 hours as needed.  - Call for new or changing lesions. Seborrheic Keratoses - Stuck-on, waxy, tan-brown papules and plaques  - Discussed benign etiology and prognosis. - Observe - Call for any changes        Basal cell carcinoma (BCC) of skin of left lower extremity including hip Left Pretibia  Destruction of lesion  Destruction method: electrodesiccation and curettage   Informed consent: discussed and consent obtained   Timeout:  patient name, date of birth, surgical site, and procedure verified Anesthesia: the lesion was anesthetized in a standard fashion   Anesthetic:  1% lidocaine w/ epinephrine 1-100,000 buffered w/ 8.4% NaHCO3 Curettage performed in three different directions: Yes   Electrodesiccation performed over the curetted area: Yes   Lesion length (cm):  1 Lesion width (cm):  1 Margin per side (cm):  0.1  Final wound size (cm):  1.2 Hemostasis achieved with:  pressure, aluminum chloride and electrodesiccation Outcome: patient tolerated procedure well with no complications   Post-procedure details: wound care instructions given   Additional details:  Mupirocin ointment and Bandaid applied    Basal cell carcinoma (BCC) of skin of other part of face Left Sideburn  Destruction of lesion  Destruction method: electrodesiccation and curettage   Informed consent: discussed and consent obtained   Timeout:  patient name, date of birth, surgical site, and procedure verified Anesthesia: the lesion was anesthetized in a standard fashion   Anesthetic:  1% lidocaine w/ epinephrine 1-100,000 buffered w/ 8.4% NaHCO3 Curettage performed in three different directions: Yes   Electrodesiccation performed over the curetted area: Yes   Lesion length (cm):  0.7 Lesion width (cm):  0.5 Margin per side (cm):  0.2 Final wound size (cm):  1.1 Hemostasis achieved with:  pressure, aluminum chloride and electrodesiccation Outcome: patient tolerated procedure well with no complications   Post-procedure details: wound care instructions given   Additional details:  Mupirocin ointment and Bandaid applied    Inflamed seborrheic keratosis (4) Crown x 3, Frontal Scalp x 1  Destruction of lesion - Crown x 3, Frontal Scalp x 1  Destruction method: cryotherapy   Informed consent: discussed and consent obtained   Lesion destroyed using liquid nitrogen: Yes   Region frozen until ice ball extended beyond lesion: Yes   Outcome: patient tolerated procedure well with no complications   Post-procedure details: wound care instructions given    Return for 6 month  f/u as scheduled.   [name], CMA, am acting as scribe for [name], MD .   Documentation: I have reviewed the above documentation for accuracy and completeness, and I agree with the above.  [name] MD

## 2020-04-12 ENCOUNTER — Other Ambulatory Visit: Payer: Self-pay

## 2020-04-12 ENCOUNTER — Encounter (INDEPENDENT_AMBULATORY_CARE_PROVIDER_SITE_OTHER): Payer: Medicare Other | Admitting: Ophthalmology

## 2020-04-12 DIAGNOSIS — H33301 Unspecified retinal break, right eye: Secondary | ICD-10-CM | POA: Diagnosis not present

## 2020-04-12 DIAGNOSIS — H2513 Age-related nuclear cataract, bilateral: Secondary | ICD-10-CM

## 2020-04-12 DIAGNOSIS — H353121 Nonexudative age-related macular degeneration, left eye, early dry stage: Secondary | ICD-10-CM | POA: Diagnosis not present

## 2020-04-12 DIAGNOSIS — H4311 Vitreous hemorrhage, right eye: Secondary | ICD-10-CM

## 2020-04-12 DIAGNOSIS — H43813 Vitreous degeneration, bilateral: Secondary | ICD-10-CM | POA: Diagnosis not present

## 2020-04-23 ENCOUNTER — Encounter (INDEPENDENT_AMBULATORY_CARE_PROVIDER_SITE_OTHER): Payer: Medicare Other | Admitting: Ophthalmology

## 2020-04-23 ENCOUNTER — Other Ambulatory Visit: Payer: Self-pay

## 2020-04-23 DIAGNOSIS — H33301 Unspecified retinal break, right eye: Secondary | ICD-10-CM

## 2020-05-15 ENCOUNTER — Ambulatory Visit: Payer: Medicare Other | Admitting: Dermatology

## 2020-05-15 ENCOUNTER — Other Ambulatory Visit: Payer: Self-pay

## 2020-05-15 ENCOUNTER — Encounter: Payer: Self-pay | Admitting: Dermatology

## 2020-05-15 DIAGNOSIS — Z85828 Personal history of other malignant neoplasm of skin: Secondary | ICD-10-CM

## 2020-05-15 DIAGNOSIS — C44719 Basal cell carcinoma of skin of left lower limb, including hip: Secondary | ICD-10-CM | POA: Diagnosis not present

## 2020-05-15 DIAGNOSIS — D492 Neoplasm of unspecified behavior of bone, soft tissue, and skin: Secondary | ICD-10-CM

## 2020-05-15 DIAGNOSIS — L820 Inflamed seborrheic keratosis: Secondary | ICD-10-CM

## 2020-05-15 NOTE — Patient Instructions (Signed)

## 2020-05-15 NOTE — Progress Notes (Signed)
° °  Follow-Up Visit   Subjective  Jimmy Rangel is a 74 y.o. male who presents for the following: Skin Problem (Left collar bone area. Bleeding, irritated) and lesion (Right sideburn area.).  It is sore and irritated by shaving.  He has a h/o BCC.    The following portions of the chart were reviewed this encounter and updated as appropriate:     Review of Systems: No other skin or systemic complaints except as noted in HPI or Assessment and Plan.  Objective  Well appearing patient in no apparent distress; mood and affect are within normal limits.  All skin waist up examined.  Objective  left chest x1, right sideburn x1 (2): Erythematous keratotic or waxy stuck-on papule   Objective  left mid pretibia: 1.5cm pink pearly papule with central hypopigmentation     Objective  left pretibial, left sideburn: Well healed scar with no evidence of recurrence.   Assessment & Plan  Inflamed seborrheic keratosis (2) left chest x1, right sideburn x1  Destruction of lesion - left chest x1, right sideburn x1  Destruction method: cryotherapy   Informed consent: discussed and consent obtained   Lesion destroyed using liquid nitrogen: Yes   Region frozen until ice ball extended beyond lesion: Yes   Outcome: patient tolerated procedure well with no complications   Post-procedure details: wound care instructions given    Neoplasm of skin left mid pretibia  Skin / nail biopsy Type of biopsy: tangential   Informed consent: discussed and consent obtained   Anesthesia: the lesion was anesthetized in a standard fashion   Anesthesia comment:  Area prepped with alcohol Anesthetic:  1% lidocaine w/ epinephrine 1-100,000 buffered w/ 8.4% NaHCO3 Instrument used: flexible razor blade   Hemostasis achieved with: pressure, aluminum chloride and electrodesiccation   Outcome: patient tolerated procedure well   Post-procedure details: wound care instructions given   Post-procedure details  comment:  Ointment and small bandage applied  Specimen 1 - Surgical pathology Differential Diagnosis: AK, R/O BCC Check Margins: No 1.5cm pink pearly papule with central hypopigmentation  Rec. EDC if BCC  History of basal cell carcinoma (BCC) left pretibial, left sideburn  Clear. Observe for recurrence. Call clinic for new or changing lesions.  Recommend regular skin exams, daily broad-spectrum spf 30+ sunscreen use, and photoprotection.     Return for pending pathology results.Loraine Maple, CMA, am acting as scribe for [name], MD.  Documentation: I have reviewed the above documentation for accuracy and completeness, and I agree with the above.  [name] MD

## 2020-05-22 ENCOUNTER — Telehealth: Payer: Self-pay

## 2020-05-22 NOTE — Telephone Encounter (Signed)
Advised patient biopsy on left mid pretibia was Superficial BCC and needs treatment with EDC. Patient is scheduled for 07/02/20 and will treat at that time.

## 2020-05-22 NOTE — Telephone Encounter (Signed)
-----   Message from [name], MD sent at 05/22/2020  9:19 AM EDT ----- Skin , left mid pretibia SUPERFICIAL BASAL CELL CARCINOMA  Skin cancer- needs EDC

## 2020-05-31 ENCOUNTER — Ambulatory Visit: Payer: Self-pay | Admitting: General Surgery

## 2020-05-31 NOTE — H&P (Signed)
[name] Appointment: 05/31/2020 2:00 PM Location: [facility] Patient #: [id number] DOB: 25-Aug-1946 Married / Language: Cleophus Molt / Race: White Male  History of Present Illness Randall Hiss M. Roxine Whittinghill MD; 05/31/2020 5:19 PM) The patient is a 74 year old male who presents with an umbilical hernia. He is referred by Dr. badger for evaluation of an umbilical hernia. 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 He had a laparoscopic cholecystectomy a few years. initially seen in fall 2020 but wanted surgery in 2021 and then it was delayed to due covid pandemic. He denies any changes since he was last seen. He is still healthy and is quite active playing golf frequently. He states he has noticed a bulge at his belly button It comes and goes. But is generally always there when standing up. It bothers him occasionally. It has never been hard or firm. He denies any nausea, vomiting, diarrhea or constipation. He denies any prior blood clots. He denies smoking. He denies any chest pain, chest pressure, shortness of breath or dyspnea on exertion.   Problem List/Past Medical Randall Hiss M. 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 Redmond Pulling, MD; 05/31/2020 5:21 PM) Colon Polyp Removal - Colonoscopy Gallbladder Surgery - Laparoscopic Knee Surgery Right. Spinal Surgery - Lower Back Splenectomy  Allergies (Tanisha A. Owens Shark, [place]; 05/31/2020 2:05 PM) No Known Allergies [07/21/2019]: No Known Drug Allergies [07/21/2019]: Allergies Reconciled  Medication History (Tanisha A. Owens Shark, [place]; 05/31/2020 2:05 PM) Diclofenac Sodium (75MG  Tablet DR, Oral) Active. Glucosamine (500MG  Capsule, Oral) Active. Fish Oil (435MG  Capsule, Oral) Active. Omeprazole (10MG  Capsule DR, Oral) Active. Medications Reconciled  Social History Randall Hiss M. Redmond Pulling, MD; 05/31/2020 5:21 PM) Alcohol use Occasional  alcohol use. Caffeine use Tea. No drug use Tobacco use Never smoker.  Family History Randall Hiss M. Redmond Pulling, MD; 05/31/2020 5:21 PM) Arthritis Mother.  Other Problems Randall Hiss M. Redmond Pulling, MD; 05/31/2020 5:21 PM) Arthritis Back Pain Cholelithiasis Gastroesophageal Reflux Disease Sleep Apnea     Review of Systems Randall Hiss M. Cyriah Childrey MD; 05/31/2020 5:19 PM) All other systems negative  Vitals (Tanisha A. Brown RMA; 05/31/2020 2:06 PM) 05/31/2020 2:05 PM Weight: 206 lb Height: 71in Body Surface Area: 2.14 m Body Mass Index: 28.73 kg/m  Temp.: 98.69F  Pulse: 88 (Regular)  BP: 128/84(Sitting, Left Arm, Standard)        Physical Exam Randall Hiss M. Shanira Tine MD; 05/31/2020 5:19 PM)  General Mental Status-Alert. General Appearance-Consistent with stated age. Hydration-Well hydrated. Voice-Normal.  Head and Neck Head-normocephalic, atraumatic with no lesions or palpable masses. Trachea-midline. Thyroid Gland Characteristics - normal size and consistency.  Eye Eyeball - Bilateral-Extraocular movements intact. Sclera/Conjunctiva - Bilateral-No scleral icterus.  ENMT Ears Pinna - Bilateral - no bony growth in lateral aspect of ear canal, no edema. Nose and Sinuses External Inspection of the Nose - symmetric, no deformities observed.  Chest and Lung Exam Chest and lung exam reveals -quiet, even and easy respiratory effort with no use of accessory muscles and on auscultation, normal breath sounds, no adventitious sounds and normal vocal resonance. Inspection Chest Wall - Normal. Back - normal.  Breast - Did not examine.  Cardiovascular Cardiovascular examination reveals -normal heart sounds, regular rate and rhythm with no murmurs and normal pedal pulses bilaterally.  Abdomen Inspection  Inspection of the abdomen reveals: Note: He is an upper midline diastases. He has a palpable defect at the umbilicus of approximately 2 x 2 centimeters. Skin -  Scar -  Note: Upper midline incision extending to the umbilicus. Palpation/Percussion Palpation and Percussion of the abdomen reveal - Soft, Non Tender, No Rebound tenderness, No Rigidity (guarding) and No hepatosplenomegaly. Auscultation Auscultation of the abdomen reveals - Bowel sounds normal.  Peripheral Vascular Upper Extremity Palpation - Pulses bilaterally normal.  Neurologic Neurologic evaluation reveals -alert and oriented x 3 with no impairment of recent or remote memory. Mental Status-Normal.  Neuropsychiatric The patient's mood and affect are described as -normal. Judgment and Insight-insight is appropriate concerning matters relevant to self.  Musculoskeletal Normal Exam - Left-Upper Extremity Strength Normal and Lower Extremity Strength Normal. Normal Exam - Right-Upper Extremity Strength Normal and Lower Extremity Strength Normal.  Lymphatic Head & Neck  General Head & Neck Lymphatics: Bilateral - Description - Normal. Axillary - Did not examine. Femoral & Inguinal - Did not examine.    Assessment & Plan Randall Hiss M. Zina Pitzer MD; 05/31/2020 5:21 PM)  INCISIONAL HERNIA, WITHOUT OBSTRUCTION OR GANGRENE (K43.2) Impression: We rediscussed the etiology of ventral incisional hernias. We rediscussed the signs and symptoms of incarceration and strangulation. The patient was given educational material. I also drew diagrams.  We rediscussed nonoperative and operative management. With respect to operative management, we discussed both open repair and laparoscopic repair & laparoscopic assisted & robotic approach. We discussed the pros and cons of each approach. I discussed the typical aftercare with each procedure and how each procedure differs.  The patient has elected to laparoscopic repair of umbilical incisional hernia with mesh  We discussed the risk and benefits of surgery including but not limited to bleeding, infection, injury to surrounding structures, hernia  recurrence, mesh complications, hematoma/seroma formation, need to convert to an open procedure, blood clot formation, urinary retention, post operative ileus, general anesthesia risk, long-term abdominal pain. We discussed that this procedure can be quite uncomfortable and difficult to recover from based on how the mesh is secured to the abdominal wall. We discussed the importance of avoiding heavy lifting and straining for a period of 6 weeks.  we also talked about mesh (pros/cons) and operating during covid.  This patient encounter took 28 minutes today to perform the following: take history, perform exam, review outside records, interpret imaging, counsel the patient on their diagnosis and document encounter, findings & plan in the EHR  Current Plans Pt Education - Pamphlet Given - Hernia Surgery: discussed with patient and provided information. You are being scheduled for surgery- Our schedulers will call you.  You should hear from our office's scheduling department within 5 working days about the location, date, and time of surgery. We try to make accommodations for patient's preferences in scheduling surgery, but sometimes the OR schedule or the surgeon's schedule prevents [place] from making those accommodations.  If you have not heard from our office [phone]) in 5 working days, call the office and ask for your surgeon's nurse.  If you have other questions about your diagnosis, plan, or surgery, call the office and ask for your surgeon's nurse.  Leighton Ruff. Redmond Pulling, MD, FACS General, Bariatric, & Minimally Invasive Surgery [facility], [place]

## 2020-07-02 ENCOUNTER — Other Ambulatory Visit: Payer: Self-pay

## 2020-07-02 ENCOUNTER — Ambulatory Visit: Payer: Medicare Other | Admitting: Dermatology

## 2020-07-02 ENCOUNTER — Encounter: Payer: Self-pay | Admitting: Dermatology

## 2020-07-02 DIAGNOSIS — L814 Other melanin hyperpigmentation: Secondary | ICD-10-CM

## 2020-07-02 DIAGNOSIS — Z1283 Encounter for screening for malignant neoplasm of skin: Secondary | ICD-10-CM

## 2020-07-02 DIAGNOSIS — L578 Other skin changes due to chronic exposure to nonionizing radiation: Secondary | ICD-10-CM

## 2020-07-02 DIAGNOSIS — L820 Inflamed seborrheic keratosis: Secondary | ICD-10-CM | POA: Diagnosis not present

## 2020-07-02 DIAGNOSIS — L821 Other seborrheic keratosis: Secondary | ICD-10-CM | POA: Diagnosis not present

## 2020-07-02 DIAGNOSIS — Z85828 Personal history of other malignant neoplasm of skin: Secondary | ICD-10-CM | POA: Diagnosis not present

## 2020-07-02 DIAGNOSIS — C44719 Basal cell carcinoma of skin of left lower limb, including hip: Secondary | ICD-10-CM | POA: Diagnosis not present

## 2020-07-02 DIAGNOSIS — D1800 Hemangioma unspecified site: Secondary | ICD-10-CM

## 2020-07-02 NOTE — Progress Notes (Signed)
Follow-Up Visit   Subjective  Jimmy Rangel is a 74 y.o. male who presents for the following: UBSE.  Patient has a biopsy proven BCC of the left mid pretibia to treat today.  No other spots of concern.   The following portions of the chart were reviewed this encounter and updated as appropriate:      Review of Systems:  No other skin or systemic complaints except as noted in HPI or Assessment and Plan.  Objective  Well appearing patient in no apparent distress; mood and affect are within normal limits.  All skin waist up examined.  Objective  Multiple; see history: Well healed scar with no evidence of recurrence.   Objective  Left Mid Pretibia: Pink biopsy site.  Objective  Left Lower Pretibia x 2, Left Upper Pretibia x 1, Left Lateral Inf Knee x 1 (4), Right Forehead x 1, R ear tragus x 1 (2): Erythematous keratotic or waxy stuck-on papule     Assessment & Plan   Skin cancer screening performed today.  Actinic Damage - diffuse scaly erythematous macules with underlying dyspigmentation - Recommend daily broad spectrum sunscreen SPF 30+ to sun-exposed areas, reapply every 2 hours as needed.  - Call for new or changing lesions.  Lentigines - Scattered tan macules - Discussed due to sun exposure - Benign, observe - Call for any changes  Seborrheic Keratoses - Stuck-on, waxy, tan-brown papules and plaques  - Discussed benign etiology and prognosis. - Observe - Call for any changes  Hemangiomas - Red papules - Discussed benign nature - Observe - Call for any changes    History of basal cell carcinoma (BCC) Multiple; see history  Clear. Observe for recurrence. Call clinic for new or changing lesions.  Recommend regular skin exams, daily broad-spectrum spf 30+ sunscreen use, and photoprotection.     Basal cell carcinoma (BCC) of skin of left lower extremity including hip Left Mid Pretibia  Destruction of lesion  Destruction method:  electrodesiccation and curettage   Informed consent: discussed and consent obtained   Timeout:  patient name, date of birth, surgical site, and procedure verified Anesthesia: the lesion was anesthetized in a standard fashion   Anesthetic:  1% lidocaine w/ epinephrine 1-100,000 buffered w/ 8.4% NaHCO3 Curettage performed in three different directions: Yes   Electrodesiccation performed over the curetted area: Yes   Final wound size (cm):  1.9 Hemostasis achieved with:  pressure, aluminum chloride and electrodesiccation Outcome: patient tolerated procedure well with no complications   Post-procedure details: wound care instructions given   Additional details:  1.9 x 1.5 cm Mupirocin ointment and Bandaid applied    Inflamed seborrheic keratosis (6) Left Lower Pretibia x 2, Left Upper Pretibia x 1, Left Lateral Inf Knee x 1 (4); Right Forehead x 1, R ear tragus x 1 (2)  Vs BCCs (L lower leg)  Recheck on follow-up.  Consider bx L pretibia if not improved  Destruction of lesion - Left Lower Pretibia x 2, Left Upper Pretibia x 1, Left Lateral Inf Knee x 1  Destruction method: cryotherapy   Informed consent: discussed and consent obtained   Lesion destroyed using liquid nitrogen: Yes   Region frozen until ice ball extended beyond lesion: Yes   Outcome: patient tolerated procedure well with no complications   Post-procedure details: wound care instructions given    Return in about 3 months (around 10/01/2020) for Recheck ISKs vs BCCs.   [name], CMA, am acting as scribe for [name], MD .  Documentation: I have reviewed the above documentation for accuracy and completeness, and I agree with the above.  [name] MD

## 2020-07-02 NOTE — Patient Instructions (Addendum)
Wound Care Instructions  1. Cleanse wound gently with soap and water once a day then pat dry with clean gauze. Apply a thing coat of Petrolatum (petroleum jelly, "Vaseline") over the wound (unless you have an allergy to this). We recommend that you use a new, sterile tube of Vaseline. Do not pick or remove scabs. Do not remove the yellow or white "healing tissue" from the base of the wound.  2. Cover the wound with fresh, clean, nonstick gauze and secure with paper tape. You may use Band-Aids in place of gauze and tape if the would is small enough, but would recommend trimming much of the tape off as there is often too much. Sometimes Band-Aids can irritate the skin.  3. If you experience any problems, such as abnormal amounts of bleeding, swelling, significant bruising, significant pain, or evidence of infection, please call the office immediately.    

## 2020-07-04 ENCOUNTER — Other Ambulatory Visit: Payer: Self-pay

## 2020-07-04 ENCOUNTER — Ambulatory Visit: Payer: Medicare Other | Admitting: Dermatology

## 2020-07-04 DIAGNOSIS — L03116 Cellulitis of left lower limb: Secondary | ICD-10-CM

## 2020-07-04 MED ORDER — MUPIROCIN 2 % EX OINT: TOPICAL_OINTMENT | Freq: Two times a day (BID) | CUTANEOUS | 0 refills | Status: DC

## 2020-07-04 MED ORDER — DOXYCYCLINE HYCLATE 100 MG PO CAPS: 100.0000 mg | ORAL_CAPSULE | Freq: Two times a day (BID) | ORAL | 0 refills | Status: AC

## 2020-07-04 NOTE — Progress Notes (Signed)
   Follow-Up Visit   Subjective  Jimmy Rangel is a 74 y.o. male who presents for the following: Other (Check EDC site of left mid pretibia - Dr. Nicole Kindred treated Jimmy Rangel 07/02/20 and he just wants to make sure it is not infected.).  The following portions of the chart were reviewed this encounter and updated as appropriate:  Tobacco  Allergies  Meds  Problems  Med Hx  Surg Hx  Fam Hx     Review of Systems:  No other skin or systemic complaints except as noted in HPI or Assessment and Plan.  Objective  Well appearing patient in no apparent distress; mood and affect are within normal limits.  A focused examination was performed including left lower leg. Relevant physical exam findings are noted in the Assessment and Plan.    Assessment & Plan  Cellulitis of left lower extremity in situ of recent skin cancer treatment Left mid pretibia  Start Doxycycline 100 mg 1 po bid with food and plenty of fluid x 7 days, Mupirocin daily with dressing changes  doxycycline (VIBRAMYCIN) 100 MG capsule - Left mid pretibia  mupirocin ointment (BACTROBAN) 2 % - Left mid pretibia  Return for follow up as scheduled.  I, Ashok Cordia, CMA, am acting as scribe for Sarina Ser, MD .  Documentation: I have reviewed the above documentation for accuracy and completeness, and I agree with the above.  Sarina Ser, MD

## 2020-07-04 NOTE — Patient Instructions (Signed)

## 2020-07-05 ENCOUNTER — Encounter: Payer: Self-pay | Admitting: Dermatology

## 2020-07-30 NOTE — Patient Instructions (Addendum)
DUE TO COVID-19 ONLY ONE VISITOR IS ALLOWED TO COME WITH YOU AND STAY IN THE WAITING ROOM ONLY DURING PRE OP AND PROCEDURE DAY OF SURGERY. THE 1 VISITOR  MAY VISIT WITH YOU AFTER SURGERY IN YOUR PRIVATE ROOM DURING VISITING HOURS ONLY!  YOU NEED TO HAVE A COVID 19 TEST ON: 08/06/20 @ 1:00 PM , THIS TEST MUST BE DONE BEFORE SURGERY,  COVID TESTING SITE [place] [address], IT IS ON THE RIGHT GOING OUT [address] APPROXIMATELY  2 MINUTES PAST [employer] ON THE RIGHT. ONCE YOUR COVID TEST IS COMPLETED,  PLEASE BEGIN THE QUARANTINE INSTRUCTIONS AS OUTLINED IN YOUR HANDOUT.                [name]    Your procedure is scheduled on: 08/09/20   Report to [facility] Main  Entrance   Report to short stay at: 5:30 AM     Call this number if you have problems the morning of surgery [phone]    Remember:   NO SOLID FOOD AFTER MIDNIGHT THE NIGHT PRIOR TO SURGERY. NOTHING BY MOUTH EXCEPT CLEAR LIQUIDS UNTIL: 4:30 AM . PLEASE FINISH ENSURE DRINK PER SURGEON ORDER  WHICH NEEDS TO BE COMPLETED AT: 4:30 AM .  CLEAR LIQUID DIET   Foods Allowed                                                                     Foods Excluded  Coffee and tea, regular and decaf                             liquids that you cannot  Plain Jell-O any favor except red or purple                                           see through such as: Fruit ices (not with fruit pulp)                                     milk, soups, orange juice  Iced Popsicles                                    All solid food Carbonated beverages, regular and diet                                    Cranberry, grape and apple juices Sports drinks like Gatorade Lightly seasoned clear broth or consume(fat free) Sugar, honey syrup  Sample Menu Breakfast                                Lunch  Supper Cranberry juice                    Beef broth                             Chicken broth Jell-O                                     Grape juice                           Apple juice Coffee or tea                        Jell-O                                      Popsicle                                                Coffee or tea                        Coffee or tea  _____________________________________________________________________   BRUSH YOUR TEETH MORNING OF SURGERY AND RINSE YOUR MOUTH OUT, NO CHEWING GUM CANDY OR MINTS.     Take these medicines the morning of surgery with A SIP OF WATER: omeprazole.Use Flonase as usual.                               You may not have any metal on your body including hair pins and              piercings  Do not wear jewelry, lotions, powders or perfumes, deodorant             Men may shave face and neck.   Do not bring valuables to the hospital. [place].  Contacts, dentures or bridgework may not be worn into surgery.  Leave suitcase in the car. After surgery it may be brought to your room.     Patients discharged the day of surgery will not be allowed to drive home. IF YOU ARE HAVING SURGERY AND GOING HOME THE SAME DAY, YOU MUST HAVE AN ADULT TO DRIVE YOU HOME AND BE WITH YOU FOR 24 HOURS. YOU MAY GO HOME BY TAXI OR UBER OR ORTHERWISE, BUT AN ADULT MUST ACCOMPANY YOU HOME AND STAY WITH YOU FOR 24 HOURS.  Name and phone number of your driver:  Special Instructions: N/A              Please read over the following fact sheets you were given: _____________________________________________________________________ PLEASE BRING CPAP MASK [place]. DEVICE WILL BE PROVIDED!         [facility] - Preparing for Surgery Before surgery, you can play an important role.  Because skin is not sterile, your skin needs to be as free of germs as possible.  You can reduce the number of germs on your skin by washing with CHG (chlorahexidine gluconate) soap before surgery.   CHG is an antiseptic cleaner which kills germs and bonds with the skin to continue killing germs even after washing. Please DO NOT use if you have an allergy to CHG or antibacterial soaps.  If your skin becomes reddened/irritated stop using the CHG and inform your nurse when you arrive at Short Stay. Do not shave (including legs and underarms) for at least 48 hours prior to the first CHG shower.  You may shave your face/neck. Please follow these instructions carefully:  1.  Shower with CHG Soap the night before surgery and the  morning of Surgery.  2.  If you choose to wash your hair, wash your hair first as usual with your  normal  shampoo.  3.  After you shampoo, rinse your hair and body thoroughly to remove the  shampoo.                           4.  Use CHG as you would any other liquid soap.  You can apply chg directly  to the skin and wash                       Gently with a scrungie or clean washcloth.  5.  Apply the CHG Soap to your body ONLY FROM THE NECK DOWN.   Do not use on face/ open                           Wound or open sores. Avoid contact with eyes, ears mouth and genitals (private parts).                       Wash face,  Genitals (private parts) with your normal soap.             6.  Wash thoroughly, paying special attention to the area where your surgery  will be performed.  7.  Thoroughly rinse your body with warm water from the neck down.  8.  DO NOT shower/wash with your normal soap after using and rinsing off  the CHG Soap.                9.  Pat yourself dry with a clean towel.            10.  Wear clean pajamas.            11.  Place clean sheets on your bed the night of your first shower and do not  sleep with pets. Day of Surgery : Do not apply any lotions/deodorants the morning of surgery.  Please wear clean clothes to the hospital/surgery center.  FAILURE TO FOLLOW THESE INSTRUCTIONS MAY RESULT IN THE CANCELLATION OF YOUR SURGERY PATIENT  SIGNATURE_________________________________  NURSE SIGNATURE__________________________________  ________________________________________________________________________

## 2020-07-31 ENCOUNTER — Encounter (HOSPITAL_COMMUNITY)
Admission: RE | Admit: 2020-07-31 | Discharge: 2020-07-31 | Disposition: A | Discharge status: Home / Self Care | Payer: Medicare Other | Source: Ambulatory Visit | Attending: General Surgery | Admitting: General Surgery

## 2020-07-31 ENCOUNTER — Other Ambulatory Visit: Payer: Self-pay

## 2020-07-31 ENCOUNTER — Encounter (HOSPITAL_COMMUNITY): Payer: Self-pay

## 2020-07-31 DIAGNOSIS — Z01812 Encounter for preprocedural laboratory examination: Secondary | ICD-10-CM | POA: Insufficient documentation

## 2020-07-31 HISTORY — DX: Cardiac murmur, unspecified: R01.1

## 2020-07-31 LAB — CBC WITH DIFFERENTIAL/PLATELET
Abs Immature Granulocytes: 0.02 10*3/uL (ref 0.00–0.07)
Basophils Absolute: 0.2 10*3/uL — ABNORMAL HIGH (ref 0.0–0.1)
Basophils Relative: 2 %
Eosinophils Absolute: 0.3 10*3/uL (ref 0.0–0.5)
Eosinophils Relative: 4 %
HCT: 43.5 % (ref 39.0–52.0)
Hemoglobin: 14.1 g/dL (ref 13.0–17.0)
Immature Granulocytes: 0 %
Lymphocytes Relative: 44 %
Lymphs Abs: 3.1 10*3/uL (ref 0.7–4.0)
MCH: 32.3 pg (ref 26.0–34.0)
MCHC: 32.4 g/dL (ref 30.0–36.0)
MCV: 99.8 fL (ref 80.0–100.0)
Monocytes Absolute: 0.8 10*3/uL (ref 0.1–1.0)
Monocytes Relative: 11 %
Neutro Abs: 2.8 10*3/uL (ref 1.7–7.7)
Neutrophils Relative %: 39 %
Platelets: 438 10*3/uL — ABNORMAL HIGH (ref 150–400)
RBC: 4.36 MIL/uL (ref 4.22–5.81)
RDW: 13.5 % (ref 11.5–15.5)
WBC: 7.1 10*3/uL (ref 4.0–10.5)
nRBC: 0 % (ref 0.0–0.2)

## 2020-07-31 LAB — COMPREHENSIVE METABOLIC PANEL
ALT: 40 U/L (ref 0–44)
AST: 30 U/L (ref 15–41)
Albumin: 4.3 g/dL (ref 3.5–5.0)
Alkaline Phosphatase: 65 U/L (ref 38–126)
Anion gap: 10 (ref 5–15)
BUN: 18 mg/dL (ref 8–23)
CO2: 25 mmol/L (ref 22–32)
Calcium: 9.5 mg/dL (ref 8.9–10.3)
Chloride: 101 mmol/L (ref 98–111)
Creatinine, Ser: 0.89 mg/dL (ref 0.61–1.24)
GFR, Estimated: >60 mL/min (ref >60)
Glucose, Bld: 219 mg/dL — ABNORMAL HIGH (ref 70–99)
Potassium: 4.3 mmol/L (ref 3.5–5.1)
Sodium: 136 mmol/L (ref 135–145)
Total Bilirubin: 0.6 mg/dL (ref 0.3–1.2)
Total Protein: 8 g/dL (ref 6.5–8.1)

## 2020-07-31 NOTE — Progress Notes (Signed)
COVID Vaccine Completed: Yes Date COVID Vaccine completed: 01/09/20 COVID vaccine manufacturer: Pfizer      PCP - Dr. Anastasia Pall. LOV: 05/23/20 Cardiologist -   Chest x-ray -  EKG -  Stress Test -  ECHO -  Cardiac Cath -  Pacemaker/ICD device last checked:  Sleep Study - Yes CPAP - Yes  Fasting Blood Sugar -  Checks Blood Sugar _____ times a day  Blood Thinner Instructions: Aspirin Instructions: Last Dose:  Anesthesia review:   Patient denies shortness of breath, fever, cough and chest pain at PAT appointment   Patient verbalized understanding of instructions that were given to them at the PAT appointment. Patient was also instructed that they will need to review over the PAT instructions again at home before surgery.

## 2020-08-06 ENCOUNTER — Other Ambulatory Visit (HOSPITAL_COMMUNITY)
Admission: RE | Admit: 2020-08-06 | Discharge: 2020-08-06 | Disposition: A | Discharge status: Home / Self Care | Payer: Medicare Other | Source: Ambulatory Visit | Attending: General Surgery | Admitting: General Surgery

## 2020-08-06 DIAGNOSIS — Z01812 Encounter for preprocedural laboratory examination: Secondary | ICD-10-CM | POA: Diagnosis present

## 2020-08-06 DIAGNOSIS — Z20822 Contact with and (suspected) exposure to covid-19: Secondary | ICD-10-CM | POA: Diagnosis not present

## 2020-08-06 LAB — SARS CORONAVIRUS 2 (TAT 6-24 HRS): SARS Coronavirus 2: NEGATIVE

## 2020-08-08 MED ORDER — VANCOMYCIN HCL 1500 MG/300ML IV SOLN
1500.0000 mg | INTRAVENOUS | Status: AC
  Administered 2020-08-09: 1500 mg via INTRAVENOUS
  Filled 2020-08-08 (×2): qty 300

## 2020-08-08 MED ORDER — BUPIVACAINE LIPOSOME 1.3 % IJ SUSP
20.0000 mL | Freq: Once | INTRAMUSCULAR | Status: DC
  Filled 2020-08-08: qty 20

## 2020-08-08 NOTE — Anesthesia Preprocedure Evaluation (Addendum)
Anesthesia Evaluation  Patient identified by MRN, date of birth, ID band Patient awake    Reviewed: Allergy & Precautions, NPO status , Patient's Chart, lab work & pertinent test results  History of Anesthesia Complications Negative for: history of anesthetic complications  Airway Mallampati: III   Neck ROM: Full    Dental  (+) Dental Advisory Given, Teeth Intact   Pulmonary sleep apnea and Continuous Positive Airway Pressure Ventilation ,    Pulmonary exam normal        Cardiovascular negative cardio ROS Normal cardiovascular exam   '19 Myoperfusion - Nuclear stress EF: 59%. There was no ST segment deviation noted during stress. The study is normal. This is a low risk study. The left ventricular ejection fraction is normal (55-65%).    Neuro/Psych  Headaches, negative psych ROS   GI/Hepatic Neg liver ROS, GERD  Medicated and Controlled,  Endo/Other  negative endocrine ROS  Renal/GU negative Renal ROS     Musculoskeletal  (+) Arthritis ,   Abdominal   Peds  Hematology negative hematology ROS (+)   Anesthesia Other Findings Covid test negative   Reproductive/Obstetrics                            Anesthesia Physical Anesthesia Plan  ASA: II  Anesthesia Plan: General   Post-op Pain Management:    Induction: Intravenous  PONV Risk Score and Plan: 3 and Treatment may vary due to age or medical condition, Ondansetron and Dexamethasone  Airway Management Planned: Oral ETT  Additional Equipment: None  Intra-op Plan:   Post-operative Plan: Extubation in OR  Informed Consent: I have reviewed the patients History and Physical, chart, labs and discussed the procedure including the risks, benefits and alternatives for the proposed anesthesia with the patient or authorized representative who has indicated his/her understanding and acceptance.     Dental advisory given  Plan  Discussed with: CRNA and Anesthesiologist  Anesthesia Plan Comments: (Will have glidescope available )       Anesthesia Quick Evaluation

## 2020-08-09 ENCOUNTER — Encounter (HOSPITAL_COMMUNITY)
Admission: RE | Disposition: A | Discharge status: Home / Self Care | Payer: Self-pay | Source: Ambulatory Visit | Attending: General Surgery

## 2020-08-09 ENCOUNTER — Ambulatory Visit (HOSPITAL_COMMUNITY): Payer: Medicare Other | Admitting: Certified Registered Nurse Anesthetist

## 2020-08-09 ENCOUNTER — Ambulatory Visit (HOSPITAL_COMMUNITY)
Admission: RE | Admit: 2020-08-09 | Discharge: 2020-08-09 | Disposition: A | Discharge status: Home / Self Care | Payer: Medicare Other | Source: Ambulatory Visit | Attending: General Surgery | Admitting: General Surgery

## 2020-08-09 ENCOUNTER — Encounter (HOSPITAL_COMMUNITY): Payer: Self-pay | Admitting: General Surgery

## 2020-08-09 ENCOUNTER — Other Ambulatory Visit: Payer: Self-pay

## 2020-08-09 DIAGNOSIS — K432 Incisional hernia without obstruction or gangrene: Secondary | ICD-10-CM | POA: Diagnosis not present

## 2020-08-09 DIAGNOSIS — Z9081 Acquired absence of spleen: Secondary | ICD-10-CM | POA: Insufficient documentation

## 2020-08-09 DIAGNOSIS — Z880 Allergy status to penicillin: Secondary | ICD-10-CM | POA: Diagnosis not present

## 2020-08-09 DIAGNOSIS — Z888 Allergy status to other drugs, medicaments and biological substances status: Secondary | ICD-10-CM | POA: Diagnosis not present

## 2020-08-09 DIAGNOSIS — Z9049 Acquired absence of other specified parts of digestive tract: Secondary | ICD-10-CM | POA: Diagnosis not present

## 2020-08-09 HISTORY — PX: INCISIONAL HERNIA REPAIR: SHX193

## 2020-08-09 SURGERY — REPAIR, HERNIA, INCISIONAL, LAPAROSCOPIC
Anesthesia: General

## 2020-08-09 MED ORDER — OXYCODONE HCL 5 MG PO TABS: 5.0000 mg | ORAL_TABLET | Freq: Four times a day (QID) | ORAL | 0 refills | Status: DC | PRN

## 2020-08-09 MED ORDER — CHLORHEXIDINE GLUCONATE CLOTH 2 % EX PADS: 6.0000 | MEDICATED_PAD | Freq: Once | CUTANEOUS | Status: DC

## 2020-08-09 MED ORDER — GLYCOPYRROLATE PF 0.2 MG/ML IJ SOSY
PREFILLED_SYRINGE | INTRAMUSCULAR | Status: DC | PRN
  Administered 2020-08-09: .2 mg via INTRAVENOUS

## 2020-08-09 MED ORDER — ONDANSETRON HCL 4 MG/2ML IJ SOLN
INTRAMUSCULAR | Status: AC
  Filled 2020-08-09: qty 2

## 2020-08-09 MED ORDER — ROCURONIUM BROMIDE 10 MG/ML (PF) SYRINGE
PREFILLED_SYRINGE | INTRAVENOUS | Status: AC
  Filled 2020-08-09: qty 10

## 2020-08-09 MED ORDER — ACETAMINOPHEN 500 MG PO TABS: 1000.0000 mg | ORAL_TABLET | Freq: Four times a day (QID) | ORAL | 0 refills | Status: AC | PRN

## 2020-08-09 MED ORDER — LIDOCAINE 2% (20 MG/ML) 5 ML SYRINGE
INTRAMUSCULAR | Status: DC | PRN
  Administered 2020-08-09: 80 mg via INTRAVENOUS

## 2020-08-09 MED ORDER — BUPIVACAINE-EPINEPHRINE (PF) 0.25% -1:200000 IJ SOLN
INTRAMUSCULAR | Status: AC
  Filled 2020-08-09: qty 30

## 2020-08-09 MED ORDER — PROPOFOL 10 MG/ML IV BOLUS
INTRAVENOUS | Status: DC | PRN
  Administered 2020-08-09: 150 mg via INTRAVENOUS

## 2020-08-09 MED ORDER — GLYCOPYRROLATE PF 0.2 MG/ML IJ SOSY
PREFILLED_SYRINGE | INTRAMUSCULAR | Status: AC
  Filled 2020-08-09: qty 1

## 2020-08-09 MED ORDER — FENTANYL CITRATE (PF) 100 MCG/2ML IJ SOLN
INTRAMUSCULAR | Status: DC | PRN
  Administered 2020-08-09 (×2): 50 ug via INTRAVENOUS
  Administered 2020-08-09: 100 ug via INTRAVENOUS

## 2020-08-09 MED ORDER — FENTANYL CITRATE (PF) 100 MCG/2ML IJ SOLN: 25.0000 ug | INTRAMUSCULAR | Status: DC | PRN

## 2020-08-09 MED ORDER — LIDOCAINE 2% (20 MG/ML) 5 ML SYRINGE
INTRAMUSCULAR | Status: DC | PRN
  Administered 2020-08-09: 1.5 mg/kg/h via INTRAVENOUS

## 2020-08-09 MED ORDER — GABAPENTIN 100 MG PO CAPS
100.0000 mg | ORAL_CAPSULE | ORAL | Status: AC
  Administered 2020-08-09: 100 mg via ORAL
  Filled 2020-08-09: qty 1

## 2020-08-09 MED ORDER — BUPIVACAINE LIPOSOME 1.3 % IJ SUSP
INTRAMUSCULAR | Status: DC | PRN
  Administered 2020-08-09: 20 mL

## 2020-08-09 MED ORDER — BUPIVACAINE-EPINEPHRINE 0.25% -1:200000 IJ SOLN
INTRAMUSCULAR | Status: DC | PRN
  Administered 2020-08-09: 30 mL
  Administered 2020-08-09: 20 mL

## 2020-08-09 MED ORDER — OXYCODONE HCL 5 MG/5ML PO SOLN: 5.0000 mg | Freq: Once | ORAL | Status: DC | PRN

## 2020-08-09 MED ORDER — KETOROLAC TROMETHAMINE 30 MG/ML IJ SOLN
INTRAMUSCULAR | Status: AC
  Filled 2020-08-09: qty 1

## 2020-08-09 MED ORDER — DEXAMETHASONE SODIUM PHOSPHATE 10 MG/ML IJ SOLN
INTRAMUSCULAR | Status: DC | PRN
  Administered 2020-08-09: 8 mg via INTRAVENOUS

## 2020-08-09 MED ORDER — ACETAMINOPHEN 500 MG PO TABS
1000.0000 mg | ORAL_TABLET | ORAL | Status: AC
  Administered 2020-08-09: 1000 mg via ORAL
  Filled 2020-08-09: qty 2

## 2020-08-09 MED ORDER — PHENYLEPHRINE HCL (PRESSORS) 10 MG/ML IV SOLN
INTRAVENOUS | Status: AC
  Filled 2020-08-09: qty 1

## 2020-08-09 MED ORDER — ONDANSETRON HCL 4 MG/2ML IJ SOLN: 4.0000 mg | Freq: Once | INTRAMUSCULAR | Status: DC | PRN

## 2020-08-09 MED ORDER — ORAL CARE MOUTH RINSE: 15.0000 mL | Freq: Once | OROMUCOSAL | Status: AC

## 2020-08-09 MED ORDER — ENSURE PRE-SURGERY PO LIQD
296.0000 mL | Freq: Once | ORAL | Status: DC
  Filled 2020-08-09: qty 296

## 2020-08-09 MED ORDER — LIDOCAINE HCL 2 % IJ SOLN
INTRAMUSCULAR | Status: AC
  Filled 2020-08-09: qty 20

## 2020-08-09 MED ORDER — 0.9 % SODIUM CHLORIDE (POUR BTL) OPTIME
TOPICAL | Status: DC | PRN
  Administered 2020-08-09: 1000 mL

## 2020-08-09 MED ORDER — SUGAMMADEX SODIUM 200 MG/2ML IV SOLN
INTRAVENOUS | Status: DC | PRN
  Administered 2020-08-09: 200 mg via INTRAVENOUS

## 2020-08-09 MED ORDER — ROCURONIUM BROMIDE 10 MG/ML (PF) SYRINGE
PREFILLED_SYRINGE | INTRAVENOUS | Status: DC | PRN
  Administered 2020-08-09: 10 mg via INTRAVENOUS
  Administered 2020-08-09: 50 mg via INTRAVENOUS

## 2020-08-09 MED ORDER — EPHEDRINE SULFATE-NACL 50-0.9 MG/10ML-% IV SOSY
PREFILLED_SYRINGE | INTRAVENOUS | Status: DC | PRN
  Administered 2020-08-09 (×3): 5 mg via INTRAVENOUS

## 2020-08-09 MED ORDER — DEXAMETHASONE SODIUM PHOSPHATE 10 MG/ML IJ SOLN
INTRAMUSCULAR | Status: AC
  Filled 2020-08-09: qty 1

## 2020-08-09 MED ORDER — CHLORHEXIDINE GLUCONATE 0.12 % MT SOLN
15.0000 mL | Freq: Once | OROMUCOSAL | Status: AC
  Administered 2020-08-09: 15 mL via OROMUCOSAL

## 2020-08-09 MED ORDER — FENTANYL CITRATE (PF) 250 MCG/5ML IJ SOLN
INTRAMUSCULAR | Status: AC
  Filled 2020-08-09: qty 5

## 2020-08-09 MED ORDER — LACTATED RINGERS IR SOLN
Status: DC | PRN
  Administered 2020-08-09: 1000 mL

## 2020-08-09 MED ORDER — OXYCODONE HCL 5 MG PO TABS: 5.0000 mg | ORAL_TABLET | Freq: Once | ORAL | Status: DC | PRN

## 2020-08-09 MED ORDER — EPHEDRINE 5 MG/ML INJ
INTRAVENOUS | Status: AC
  Filled 2020-08-09: qty 10

## 2020-08-09 MED ORDER — LACTATED RINGERS IV SOLN: INTRAVENOUS | Status: DC

## 2020-08-09 MED ORDER — PROPOFOL 10 MG/ML IV BOLUS
INTRAVENOUS | Status: AC
  Filled 2020-08-09: qty 20

## 2020-08-09 MED ORDER — ONDANSETRON HCL 4 MG/2ML IJ SOLN
INTRAMUSCULAR | Status: DC | PRN
  Administered 2020-08-09: 4 mg via INTRAVENOUS

## 2020-08-09 MED ORDER — LIDOCAINE 2% (20 MG/ML) 5 ML SYRINGE
INTRAMUSCULAR | Status: AC
  Filled 2020-08-09: qty 5

## 2020-08-09 SURGICAL SUPPLY — 44 items
ADH SKN CLS APL DERMABOND .7 (GAUZE/BANDAGES/DRESSINGS) ×1
APL PRP STRL LF DISP 70% ISPRP (MISCELLANEOUS) ×1
APL SKNCLS STERI-STRIP NONHPOA (GAUZE/BANDAGES/DRESSINGS)
BENZOIN TINCTURE PRP APPL 2/3 (GAUZE/BANDAGES/DRESSINGS) IMPLANT
BINDER ABDOMINAL 12 ML 46-62 (SOFTGOODS) IMPLANT
CABLE HIGH FREQUENCY MONO STRZ (ELECTRODE) ×3 IMPLANT
CHLORAPREP W/TINT 26 (MISCELLANEOUS) ×3 IMPLANT
CLOSURE WOUND 1/2 X4 (GAUZE/BANDAGES/DRESSINGS)
COVER SURGICAL LIGHT HANDLE (MISCELLANEOUS) ×3 IMPLANT
COVER WAND RF STERILE (DRAPES) IMPLANT
DECANTER SPIKE VIAL GLASS SM (MISCELLANEOUS) ×3 IMPLANT
DERMABOND ADVANCED (GAUZE/BANDAGES/DRESSINGS) ×2
DERMABOND ADVANCED .7 DNX12 (GAUZE/BANDAGES/DRESSINGS) IMPLANT
DEVICE SECURE STRAP 25 ABSORB (INSTRUMENTS) ×2 IMPLANT
DEVICE TROCAR PUNCTURE CLOSURE (ENDOMECHANICALS) ×3 IMPLANT
DRAIN CHANNEL 19F RND (DRAIN) IMPLANT
DRAPE INCISE IOBAN 66X45 STRL (DRAPES) IMPLANT
ELECT PENCIL ROCKER SW 15FT (MISCELLANEOUS) ×2 IMPLANT
EVACUATOR SILICONE 100CC (DRAIN) IMPLANT
GOWN STRL REUS W/TWL XL LVL3 (GOWN DISPOSABLE) ×9 IMPLANT
KIT BASIN OR (CUSTOM PROCEDURE TRAY) ×3 IMPLANT
L-HOOK LAP DISP 36CM (ELECTROSURGICAL) ×3
LHOOK LAP DISP 36CM (ELECTROSURGICAL) IMPLANT
MARKER SKIN DUAL TIP RULER LAB (MISCELLANEOUS) ×3 IMPLANT
MESH PARIETEX 4.7 (Mesh General) ×2 IMPLANT
NDL SPNL 22GX3.5 QUINCKE BK (NEEDLE) ×1 IMPLANT
NEEDLE SPNL 22GX3.5 QUINCKE BK (NEEDLE) ×3 IMPLANT
PAD POSITIONING PINK XL (MISCELLANEOUS) IMPLANT
SCISSORS LAP 5X35 DISP (ENDOMECHANICALS) IMPLANT
SET IRRIG TUBING LAPAROSCOPIC (IRRIGATION / IRRIGATOR) IMPLANT
SET TUBE SMOKE EVAC HIGH FLOW (TUBING) ×3 IMPLANT
SHEARS HARMONIC ACE PLUS 36CM (ENDOMECHANICALS) IMPLANT
SLEEVE XCEL OPT CAN 5 100 (ENDOMECHANICALS) ×3 IMPLANT
STRIP CLOSURE SKIN 1/2X4 (GAUZE/BANDAGES/DRESSINGS) IMPLANT
SUT ETHILON 2 0 PS N (SUTURE) IMPLANT
SUT MNCRL AB 4-0 PS2 18 (SUTURE) ×3 IMPLANT
SUT NOVA 1 T20/GS 25DT (SUTURE) ×4 IMPLANT
SUT VIC AB 3-0 SH 18 (SUTURE) IMPLANT
TOWEL OR 17X26 10 PK STRL BLUE (TOWEL DISPOSABLE) ×3 IMPLANT
TRAY FOLEY MTR SLVR 14FR STAT (SET/KITS/TRAYS/PACK) IMPLANT
TRAY FOLEY MTR SLVR 16FR STAT (SET/KITS/TRAYS/PACK) IMPLANT
TRAY LAPAROSCOPIC (CUSTOM PROCEDURE TRAY) ×3 IMPLANT
TROCAR BLADELESS OPT 5 100 (ENDOMECHANICALS) ×3 IMPLANT
TROCAR XCEL NON-BLD 11X100MML (ENDOMECHANICALS) IMPLANT

## 2020-08-09 NOTE — Discharge Instructions (Signed)
[facility] LAP[facility]: POST OP INSTRUCTIONS Always review your discharge instruction sheet given to you by the facility where your surgery was performed. IF YOU HAVE DISABILITY OR FAMILY LEAVE FORMS, YOU MUST BRING THEM TO THE OFFICE FOR PROCESSING.   DO NOT GIVE THEM TO YOUR DOCTOR.  PAIN CONTROL  1. First take acetaminophen (Tylenol) to control your pain after surgery.  Follow directions on package.  Taking acetaminophen (Tylenol) regularly after surgery will help to control your pain and lower the amount of prescription pain medication you may need.  You should not take more than 3,000 mg (3 grams) of acetaminophen (Tylenol) in 24 hours.  You should not take ibuprofen (Advil), aleve, motrin, naprosyn or other NSAIDS if you have a history of stomach ulcers or chronic kidney disease.  You can take your VOLTAREN - it will count as your NSAID. 2. A prescription for pain medication may be given to you upon discharge.  Take your pain medication as prescribed, if you still have uncontrolled pain after taking acetaminophen (Tylenol) or ibuprofen (Advil). 3. Use ice packs to help control pain. 4. If you need a refill on your pain medication, please contact your pharmacy.  They will contact our office to request authorization. Prescriptions will not be filled after 5pm or on week-ends.  HOME MEDICATIONS 5. Take your usually prescribed medications unless otherwise directed.  DIET 6. You should follow a light diet the first few days after arrival home.  Be sure to include lots of fluids daily. Avoid fatty, fried foods.   CONSTIPATION 7. It is common to experience some constipation after surgery and if you are taking pain medication.  Increasing fluid intake and taking a stool softener (such as Colace) will usually help or prevent this problem from occurring.  A mild laxative (Milk of Magnesia or Miralax) should be taken according to package instructions if there are no  bowel movements after 48 hours.  WOUND/INCISION CARE 8. Most patients will experience some swelling and bruising in the area of the incisions.  Ice packs will help.  Swelling and bruising can take several days to resolve.  9. Unless discharge instructions indicate otherwise, follow guidelines below  a. STERI-STRIPS - you may remove your outer bandages 48 hours after surgery, and you may shower at that time.  You have steri-strips (small skin tapes) in place directly over the incision.  These strips should be left on the skin for 7-10 days.   b. DERMABOND/SKIN GLUE - you may shower in 24 hours.  The glue will flake off over the next 2-3 weeks. 10. Any sutures or staples will be removed at the office during your follow-up visit.  ACTIVITIES 11. You may resume regular (light) daily activities beginning the next day--such as daily self-care, walking, climbing stairs--gradually increasing activities as tolerated.  You may have sexual intercourse when it is comfortable.  Refrain from any heavy lifting or straining until approved by your doctor. a. You may drive when you are no longer taking prescription pain medication, you can comfortably wear a seatbelt, and you can safely maneuver your car and apply brakes.  FOLLOW-UP 12. You should see your doctor in the office for a follow-up appointment approximately 2-3 weeks after your surgery.  You should have been given your post-op/follow-up appointment when your surgery was scheduled.  If you did not receive a post-op/follow-up appointment, make sure that you call for this appointment within a day or two after you arrive home to insure  a convenient appointment time.  OTHER INSTRUCTIONS 13. DO NOT LIFT/PUSH/PULL ANYTHING GREATER THAN 15 LBS FOR 4 WEEKS  WHEN TO CALL YOUR DOCTOR: 1. Fever over 101.0 2. Inability to urinate 3. Continued bleeding from incision. 4. Increased pain, redness, or drainage from the incision. 5. Increasing abdominal pain  The  clinic staff is available to answer your questions during regular business hours.  Please don't hesitate to call and ask to speak to one of the nurses for clinical concerns.  If you have a medical emergency, go to the nearest emergency room or call 911.  A surgeon from [facility] is always on call at the hospital. [address] [place], [place], [place]  [address] ? [address] [place], [place], [place]   [address] [phone] ? [phone] ? FAX [phone] Web site: [email or web address]

## 2020-08-09 NOTE — Op Note (Addendum)
Laparoscopic Assisted Incisional Umbilical Hernia Repair with mesh Procedure Note  Indications: Symptomatic incisional hernia at umbilicus  Pre-operative Diagnosis: incisional hernia at umbilicus  Post-operative Diagnosis: incisional hernia at umbilicus  Surgeon: Greer Pickerel, MD FACS  Assistants: Ahmed Prima, MD [facility] - surgery resident  Anesthesia: General endotracheal anesthesia  ASA Class: 2  Procedure Details  The patient was seen in the Holding Room. The risks, benefits, complications, treatment options, and expected outcomes were discussed with the patient. The possibilities of reaction to medication, pulmonary aspiration, perforation of viscus, bleeding, recurrent infection, the need for additional procedures, failure to diagnose a condition, and creating a complication requiring transfusion or operation were discussed with the patient. The patient concurred with the proposed plan, giving informed consent.  The site of surgery properly noted/marked. The patient was taken to the operating room, identified as Jimmy Rangel and the procedure verified as laparoscopic, open assisted, incisional hernia repair with mesh. A Time Out was held and the above information confirmed.  Patient had emptied his bladder prior to going back to surgery.  He received oral Tylenol and gabapentin preoperatively.  The patient was placed supine.  After establishing general anesthesia, the abdomen was prepped with Chloraprep and draped in standard fashion.  A 5 mm Optiview was used the cannulate the peritoneal cavity in the left upper quadrant below the costal margin by the attending surgeon.  Pneumoperitoneum was obtained by insufflating CO2, maintaining a maximum pressure of 15 mmHg.  The 5 mm 30-degree laparoscopic was inserted.  There were minor omental adhesions to the anterior abdominal wall in and around the hernia defect and in the extreme LUQ.   A 5 mm port was placed in the left anterior axillary  line at the level of the umbilicus.   Harmonic scalpel and gentle traction were used to dissect the omental adhesions away from the anterior abdominal wall.  We cleared the entire abdominal wall and were able to visualize one fascial defect at the umbilicus.    We then desufflated the abdomen and made an infraumbilical incision thru an old scar. The dissection was carried down with blunt and Bovie electrocautery until the hernia sac was isolated and then divided. The fascial defect measured 2cm in greatest dimension.  We selected a 12 cm circular piece of Covidien parietex composite mesh. We placed a central suture with 0-Novafil then inserted the mesh into the abdomen through the hernia defect. We then closed the fascia of the hernia defect with interrupted 0-Novafil sutures.  We then returned to the laparoscopic portion of the case and insufflated the abdomen. The mesh was then unrolled.  The stay sutures were then pulled up through the umbilicus incision using the Endo-close device. The Secure Strap device was then used to tack down the edges of the mesh at 1 cm intervals circumferentially.  We placed a few tacks inside the outer ring of tacks.  This deployed the mesh widely over the fascial defect.   The stay suture was then tied down. We inspected for hemostasis.  Pneumoperitoneum was then released as we removed the remainder of the trocars. The umbilical stalk was sutured back to the umbilical fascia with a 3-0 vicryl and interrupted 3-0 vicryl in the deep dermis and the umbilicus was tacked to the fascia in the center to recreate an inward umbilicus. The skin was then closed with 4-0 Monocryl. The port sites were closed with 4-0 Monocryl.  All of the incisions and were then sealed with Dermabond.  An abdominal binder was placed around the patient's abdomen.  The patient was extubated and brought to the recovery room in stable condition.  All sponge, instrument, and needle counts were correct prior to  closure and at the conclusion of the case. Dr. Redmond Pulling was present for all portions of the case except for skin closure.  Findings: 2cm incisional hernia at umbilicus   Type of repair - primary suture with mesh underlay (IPOM)   Name of mesh - covidien parietex  Size of mesh - round 12 cm  Mesh overlap - >5 cm  Placement of mesh - beneath fascia and into peritoneal cavity   Estimated Blood Loss:  Minimal         Complications:  None; patient tolerated the procedure well.         Disposition: PACU - hemodynamically stable.         Condition: stable  Ahmed Prima, MD [facility] PGY 5  General Surgery  I was present for all portions of the surgery except for skin closure and I was immediately available throughout the enitre procedure.  I have reviewed & edited & agree with the operative note as documented by the resident.

## 2020-08-09 NOTE — Anesthesia Postprocedure Evaluation (Signed)
Anesthesia Post Note  Patient: Jimmy Rangel  Procedure(s) Performed: LAPAROSCOPIC REPAIR INCISIONAL HERNIA WITH MESH AND LYSIS OF ADHESIONS (N/A )     Patient location during evaluation: PACU Anesthesia Type: General Level of consciousness: awake and alert Pain management: pain level controlled Vital Signs Assessment: post-procedure vital signs reviewed and stable Respiratory status: spontaneous breathing, nonlabored ventilation and respiratory function stable Cardiovascular status: blood pressure returned to baseline and stable Postop Assessment: no apparent nausea or vomiting Anesthetic complications: no   No complications documented.  Last Vitals:  Vitals:   08/09/20 0945 08/09/20 0950  BP: 124/77 128/79  Pulse: 80 89  Resp: 10 14  Temp: 36.5 C   SpO2: 97% 96%    Last Pain:  Vitals:   08/09/20 0930  TempSrc:   PainSc: [place]

## 2020-08-09 NOTE — Anesthesia Procedure Notes (Signed)
Procedure Name: Intubation Date/Time: 08/09/2020 7:37 AM Performed by: Montel Clock, CRNA Pre-anesthesia Checklist: Patient identified, Emergency Drugs available, Suction available, Patient being monitored and Timeout performed Patient Re-evaluated:Patient Re-evaluated prior to induction Oxygen Delivery Method: Circle system utilized Preoxygenation: Pre-oxygenation with 100% oxygen Induction Type: IV induction Ventilation: Mask ventilation without difficulty and Oral airway inserted - appropriate to patient size Laryngoscope Size: 3 and Glidescope Grade View: Grade I Tube type: Oral Tube size: 7.5 mm Number of attempts: 1 Airway Equipment and Method: Stylet Placement Confirmation: ETT inserted through vocal cords under direct vision,  positive ETCO2 and breath sounds checked- equal and bilateral Secured at: 23 cm Tube secured with: Tape Dental Injury: Teeth and Oropharynx as per pre-operative assessment  Comments: Elective glidescope related to significantly recessed jaw.

## 2020-08-09 NOTE — Transfer of Care (Signed)
Immediate Anesthesia Transfer of Care Note  Patient: Jimmy Rangel  Procedure(s) Performed: LAPAROSCOPIC REPAIR INCISIONAL HERNIA WITH MESH AND LYSIS OF ADHESIONS (N/A )  Patient Location: PACU  Anesthesia Type:General  Level of Consciousness: drowsy and patient cooperative  Airway & Oxygen Therapy: Patient Spontanous Breathing and Patient connected to face mask oxygen  Post-op Assessment: Report given to RN and Post -op Vital signs reviewed and stable  Post vital signs: Reviewed and stable  Last Vitals:  Vitals Value Taken Time  BP 146/78 08/09/20 0856  Temp    Pulse 89 08/09/20 0858  Resp 13 08/09/20 0858  SpO2 100 % 08/09/20 0858  Vitals shown include unvalidated device data.  Last Pain:  Vitals:   08/09/20 0856  TempSrc:   PainSc: (P) 0-No pain      Patients Stated Pain Goal: 4 (17/40/81 4481)  Complications: No complications documented.

## 2020-08-09 NOTE — H&P (Signed)
CC: here for surgery  Requesting provider: n/a  HPI: Jimmy Rangel is an 74 y.o. male who is here for elective repair of incisional umbilical hernia. Denies changes since seen in clinic. No f/v/n/c/cp/sob. Trips to ed. No new meds. No smoking.   The patient is a 74 year old male who presents with an umbilical hernia. He is referred by Dr. badger for evaluation of an umbilical hernia. 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 He had a laparoscopic cholecystectomy a few years. initially seen in fall 2020 but wanted surgery in 2021 and then it was delayed to due covid pandemic. He denies any changes since he was last seen. He is still healthy and is quite active playing golf frequently. He states he has noticed a bulge at his belly button It comes and goes. But is generally always there when standing up. It bothers him occasionally. It has never been hard or firm. He denies any nausea, vomiting, diarrhea or constipation. He denies any prior blood clots. He denies smoking. He denies any chest pain, chest pressure, shortness of breath or dyspnea on exertion.  Past Medical History:  Diagnosis Date  . Actinic keratosis   . Arthritis   . Basal cell carcinoma 10/13/2006   left temple  . Basal cell carcinoma 10/13/2006   right anterior deltoid  . Basal cell carcinoma 08/16/2007   left distal posterior deltoid  . Basal cell carcinoma 07/31/2009   left zygomatic lat canthus   . Basal cell carcinoma 07/14/2010   right medial chest, EDC  . Basal cell carcinoma 02/26/2020   left sideburn  . Basal cell carcinoma 02/26/2020   left pretibia  . Basal cell carcinoma 07/02/2020   L mid pretibia  . 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 Calcification of aortic valve 09/20/2018   Aortic valve: Trileaflet; moderately thickened, moderately   calcified leaflets. Valve mobility was restricted.  . Chronic back pain    stenosis  . GERD (gastroesophageal  reflux disease)    takes Omeprazole daily  . Headache    rare  . Heart murmur   . History of bronchitis as a child   . Hyperlipidemia    takes Atorvastatin daily  . 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 Primary localized osteoarthritis of right knee   . Sleep apnea    uses CPAP, does not know settings  . Superficial basal cell carcinoma 08/16/2007   left distal anterior deltoid  . Superficial basal cell carcinoma 08/16/2007   left mid lateral bicep  . Superficial basal cell carcinoma 02/18/2009   right medial pretibial  . Superficial basal cell carcinoma 02/18/2009   right lateral calf  . Weakness    left leg    Past Surgical History:  Procedure Laterality Date  . BACK SURGERY     Lumbar fusion x2, 1997, 2016  . CHOLECYSTECTOMY    . COLONOSCOPY    . ESOPHAGOGASTRODUODENOSCOPY    . INJECTION KNEE Left 09/19/2018   Procedure: LEFT KNEE INJECTION;  Surgeon: Elsie Saas, MD;  Location: [facility];  Service: Orthopedics;  Laterality: Left;  . KNEE ARTHROSCOPY Right   . spleenectomy    . TOTAL KNEE ARTHROPLASTY Right 09/19/2018   Procedure: TOTAL KNEE ARTHROPLASTY;  Surgeon: Elsie Saas, MD;  Location: [facility];  Service: Orthopedics;  Laterality: Right;    Family History  Problem  Relation Age of Onset  . Leukemia Mother   . Lung cancer Father     Social:  reports that he has never smoked. He has never used smokeless tobacco. He reports current alcohol use of about 6.0 - 7.0 standard drinks of alcohol per week. He reports that he does not use drugs.  Allergies:  Allergies  Allergen Reactions  . Atorvastatin     Myalgia  . Penicillins Other (See Comments)    Passed out Has patient had a PCN reaction causing immediate rash, facial/tongue/throat swelling, SOB or lightheadedness with hypotension: Yes Has patient had a PCN reaction causing severe rash involving mucus membranes  or skin necrosis: No Has patient had a PCN reaction that required hospitalization: No Has patient had a PCN reaction occurring within the last 10 years: No If all of the above answers are "NO", then may proceed with Cephalosporin use.     Medications: I have reviewed the patient's current medications.  No results found for this or any previous visit (from the past 48 hour(s)).  No results found.  ROS - all of the below systems have been reviewed with the patient and positives are indicated with bold text General: chills, fever or night sweats Eyes: blurry vision or double vision ENT: epistaxis or sore throat Allergy/Immunology: itchy/watery eyes or nasal congestion Hematologic/Lymphatic: bleeding problems, blood clots or swollen lymph nodes Endocrine: temperature intolerance or unexpected weight changes Breast: new or changing breast lumps or nipple discharge Resp: cough, shortness of breath, or wheezing CV: chest pain or dyspnea on exertion GI: as per HPI GU: dysuria, trouble voiding, or hematuria MSK: joint pain or joint stiffness Neuro: TIA or stroke symptoms Derm: pruritus and skin lesion changes Psych: anxiety and depression  PE Blood pressure 138/82, pulse 76, temperature 98.7 F (37.1 C), temperature source Oral, resp. rate 16, height 5\' 11"  (1.803 m), weight 93.4 kg, SpO2 96 %. Constitutional: NAD; conversant; no deformities Eyes: Moist conjunctiva; no lid lag; anicteric; PERRL Neck: Trachea midline; no thyromegaly Lungs: Normal respiratory effort; no tactile fremitus CV: RRR; no palpable thrills; no pitting edema GI: Abd soft, old upper midline incision with diastasis, 2x2 umbilical hernia - soft; no palpable hepatosplenomegaly MSK: Normal gait; no clubbing/cyanosis Psychiatric: Appropriate affect; alert and oriented x3 Lymphatic: No palpable cervical or axillary lymphadenopathy Skin:no rash/edema  No results found for this or any previous visit (from the past 45  hour(s)).  No results found.  Imaging: reviewed  A/P: Jimmy Rangel is an 74 y.o. male with  Umbilical incisional hernia  To OR for lap repair ERAS protocol Iv abx Discussed resident's involvement with case All questions asked and answered  Leighton Ruff. Redmond Pulling, MD, FACS General, Bariatric, & Minimally Invasive Surgery [facility], [place]

## 2020-08-12 ENCOUNTER — Encounter (HOSPITAL_COMMUNITY): Payer: Self-pay | Admitting: General Surgery

## 2020-08-28 ENCOUNTER — Other Ambulatory Visit: Payer: Self-pay

## 2020-08-28 ENCOUNTER — Encounter (INDEPENDENT_AMBULATORY_CARE_PROVIDER_SITE_OTHER): Payer: Medicare Other | Admitting: Ophthalmology

## 2020-08-28 DIAGNOSIS — H33301 Unspecified retinal break, right eye: Secondary | ICD-10-CM

## 2020-08-28 DIAGNOSIS — H43813 Vitreous degeneration, bilateral: Secondary | ICD-10-CM | POA: Diagnosis not present

## 2020-09-24 ENCOUNTER — Ambulatory Visit: Payer: Medicare Other | Admitting: Dermatology

## 2020-11-18 ENCOUNTER — Other Ambulatory Visit: Payer: Self-pay

## 2020-11-18 ENCOUNTER — Encounter: Payer: Self-pay | Admitting: Dermatology

## 2020-11-18 ENCOUNTER — Ambulatory Visit: Payer: Medicare Other | Admitting: Dermatology

## 2020-11-18 DIAGNOSIS — L570 Actinic keratosis: Secondary | ICD-10-CM | POA: Diagnosis not present

## 2020-11-18 DIAGNOSIS — Z85828 Personal history of other malignant neoplasm of skin: Secondary | ICD-10-CM

## 2020-11-18 DIAGNOSIS — L578 Other skin changes due to chronic exposure to nonionizing radiation: Secondary | ICD-10-CM | POA: Diagnosis not present

## 2020-11-18 NOTE — Progress Notes (Signed)
Follow-Up Visit   Subjective  Jimmy Rangel is a 75 y.o. male who presents for the following: Follow-up (BCC of the left mid pretibia was treated with EDC on 07/02/20, no problems and healed well. He also had ISKs treated last visit to recheck. He has several new pink spots on his face.).   The following portions of the chart were reviewed this encounter and updated as appropriate:       Review of Systems:  No other skin or systemic complaints except as noted in HPI or Assessment and Plan.  Objective  Well appearing patient in no apparent distress; mood and affect are within normal limits.  A focused examination was performed including face, legs. Relevant physical exam findings are noted in the Assessment and Plan.  Objective  Left Mid Pretibia: Well healed scar with no evidence of recurrence.   Objective  L lower lateral pretibia x 2, L lat knee x 1, L medial lower pretibia x 1, R upper pretibia x 1, L temple x 1, L forehead x 1 (7): Pink keratotic macules  Objective  R malar cheek x 2, R forehead x 2, R preauricular x 1, L nasal dorsum x 1, R eyebrow x 1 (7): Erythematous thin papules/macules with gritty scale.    Assessment & Plan   Actinic Damage - Severe, chronic, secondary to cumulative UV radiation exposure over time - diffuse scaly erythematous macules and papules with underlying dyspigmentation - Discussed Prescription "Field Treatment" for Severe, Chronic Confluent Actinic Changes with Pre-Cancerous Actinic Keratoses Field treatment involves treatment of an entire area of skin that has confluent Actinic Changes (Sun/ Ultraviolet light damage) and PreCancerous Actinic Keratoses by method of PhotoDynamic Therapy (PDT) and/or prescription Topical Chemotherapy agents such as 5-fluorouracil, 5-fluorouracil/calcipotriene, and/or imiquimod.  The purpose is to decrease the number of clinically evident and subclinical PreCancerous lesions to prevent progression to  development of skin cancer by chemically destroying early precancer changes that may or may not be visible.  It has been shown to reduce the risk of developing skin cancer in the treated area. As a result of treatment, redness, scaling, crusting, and open sores may occur during treatment course. One or more than one of these methods may be used and may have to be used several times to control, suppress and eliminate the PreCancerous changes. Discussed treatment course, expected reaction, and possible side effects. - Recommend daily broad spectrum sunscreen SPF 30+ to sun-exposed areas, reapply every 2 hours as needed.  - Call for new or changing lesions. Start Picato 0.015% Gel Apply to face qhs x 3. Samples given (1 box), exp 11/2020. Start Picato 0.05% Gel Apply 1 tube per leg qhs x 2. Samples given (2 boxes), exp 2020.  History of Basal Cell Carcinoma of the Skin - No evidence of recurrence today - Recommend regular full body skin exams - Recommend daily broad spectrum sunscreen SPF 30+ to sun-exposed areas, reapply every 2 hours as needed.  - Call if any new or changing lesions are noted between office visits   History of basal cell carcinoma (BCC) Left Mid Pretibia  Clear. Observe for recurrence. Call clinic for new or changing lesions.  Recommend regular skin exams, daily broad-spectrum spf 30+ sunscreen use, and photoprotection.     Hypertrophic actinic keratosis (7) L lower lateral pretibia x 2, L lat knee x 1, L medial lower pretibia x 1, R upper pretibia x 1, L temple x 1, L forehead x 1  Vs BCCs (L  lower lateral pretibia x 2, L lat knee x 1, R upper pretibia x 1)  Recheck on follow-up.  Destruction of lesion - L lower lateral pretibia x 2, L lat knee x 1, L medial lower pretibia x 1, R upper pretibia x 1, L temple x 1, L forehead x 1  Destruction method: cryotherapy   Informed consent: discussed and consent obtained   Lesion destroyed using liquid nitrogen: Yes   Region  frozen until ice ball extended beyond lesion: Yes   Outcome: patient tolerated procedure well with no complications   Post-procedure details: wound care instructions given    AK (actinic keratosis) (7) R malar cheek x 2, R forehead x 2, R preauricular x 1, L nasal dorsum x 1, R eyebrow x 1  Destruction of lesion - R malar cheek x 2, R forehead x 2, R preauricular x 1, L nasal dorsum x 1, R eyebrow x 1  Destruction method: cryotherapy   Informed consent: discussed and consent obtained   Lesion destroyed using liquid nitrogen: Yes   Region frozen until ice ball extended beyond lesion: Yes   Outcome: patient tolerated procedure well with no complications   Post-procedure details: wound care instructions given    Return in about 3 months (around 02/15/2021) for AKs, BCC.  [name], CMA, am acting as scribe for [name], MD .  Documentation: I have reviewed the above documentation for accuracy and completeness, and I agree with the above.  [name] MD

## 2020-11-18 NOTE — Patient Instructions (Addendum)
Cryotherapy Aftercare  . Wash gently with soap and water everyday.   Marland Kitchen Apply Vaseline and Band-Aid daily until healed.   Actinic Damage - Severe, chronic, secondary to cumulative UV radiation exposure over time - diffuse scaly erythematous macules and papules with underlying dyspigmentation - Discussed Prescription "Field Treatment" for Severe, Chronic Confluent Actinic Changes with Pre-Cancerous Actinic Keratoses Field treatment involves treatment of an entire area of skin that has confluent Actinic Changes (Sun/ Ultraviolet light damage) and PreCancerous Actinic Keratoses by method of PhotoDynamic Therapy (PDT) and/or prescription Topical Chemotherapy agents such as 5-fluorouracil, 5-fluorouracil/calcipotriene, and/or imiquimod.  The purpose is to decrease the number of clinically evident and subclinical PreCancerous lesions to prevent progression to development of skin cancer by chemically destroying early precancer changes that may or may not be visible.  It has been shown to reduce the risk of developing skin cancer in the treated area. As a result of treatment, redness, scaling, crusting, and open sores may occur during treatment course. One or more than one of these methods may be used and may have to be used several times to control, suppress and eliminate the PreCancerous changes. Discussed treatment course, expected reaction, and possible side effects. - Recommend daily broad spectrum sunscreen SPF 30+ to sun-exposed areas, reapply every 2 hours as needed.  - Call for new or changing lesions.  Start Picato 0.05% Gel Apply 1 tube per leg at night x 2 nights.  Start Picato 0.015% Gel Apply to face x 3 nights.

## 2021-01-27 ENCOUNTER — Encounter: Payer: Medicare Other | Admitting: Dermatology

## 2021-02-17 ENCOUNTER — Other Ambulatory Visit: Payer: Self-pay

## 2021-02-17 ENCOUNTER — Encounter: Payer: Self-pay | Admitting: Dermatology

## 2021-02-17 ENCOUNTER — Ambulatory Visit: Payer: Medicare Other | Admitting: Dermatology

## 2021-02-17 DIAGNOSIS — L821 Other seborrheic keratosis: Secondary | ICD-10-CM

## 2021-02-17 DIAGNOSIS — Z85828 Personal history of other malignant neoplasm of skin: Secondary | ICD-10-CM | POA: Diagnosis not present

## 2021-02-17 DIAGNOSIS — L578 Other skin changes due to chronic exposure to nonionizing radiation: Secondary | ICD-10-CM | POA: Diagnosis not present

## 2021-02-17 DIAGNOSIS — L820 Inflamed seborrheic keratosis: Secondary | ICD-10-CM

## 2021-02-17 DIAGNOSIS — C44311 Basal cell carcinoma of skin of nose: Secondary | ICD-10-CM | POA: Diagnosis not present

## 2021-02-17 DIAGNOSIS — L570 Actinic keratosis: Secondary | ICD-10-CM

## 2021-02-17 DIAGNOSIS — C4441 Basal cell carcinoma of skin of scalp and neck: Secondary | ICD-10-CM

## 2021-02-17 DIAGNOSIS — D485 Neoplasm of uncertain behavior of skin: Secondary | ICD-10-CM

## 2021-02-17 DIAGNOSIS — C4491 Basal cell carcinoma of skin, unspecified: Secondary | ICD-10-CM

## 2021-02-17 HISTORY — DX: Basal cell carcinoma of skin, unspecified: C44.91

## 2021-02-17 NOTE — Progress Notes (Signed)
Follow-Up Visit   Subjective  Jimmy Rangel is a 75 y.o. male who presents for the following: Follow-up (Patient here for 3 month follow-up AKs and history of BCCs. He has a spot on his right cheek that was treated previously and it is still pink. He was given expired samples of Picato Gel at last visit, but he hasn't used yet. He has an itchy spot on his right posterior shoulder. He also has a spot on his left temple that was hit recently and bled.).   The following portions of the chart were reviewed this encounter and updated as appropriate:       Review of Systems:  No other skin or systemic complaints except as noted in HPI or Assessment and Plan.  Objective  Well appearing patient in no apparent distress; mood and affect are within normal limits.  A focused examination was performed including face, arms, legs, back. Relevant physical exam findings are noted in the Assessment and Plan.  Objective  Right Upper Nasolabial: 4.3mm pink pearly papule     Objective  Right Postauricular Neck: 7.55mm pink brown slightly pearly papule      Objective  Nose x 3, forehead x 9, R antihelix x 1 (13): Erythematous thin papules/macules with gritty scale.   Objective  Left Upper Temple x 1, R post shoulder x 1 (2): Erythematous keratotic or waxy stuck-on papule   Assessment & Plan  Neoplasm of uncertain behavior of skin (2) Right Upper Nasolabial  Skin / nail biopsy Type of biopsy: tangential   Informed consent: discussed and consent obtained   Patient was prepped and draped in usual sterile fashion: Area prepped with alcohol. Anesthesia: the lesion was anesthetized in a standard fashion   Anesthetic:  1% lidocaine w/ epinephrine 1-100,000 buffered w/ 8.4% NaHCO3 (0.5% bupvicaine) Instrument used: flexible razor blade   Hemostasis achieved with: pressure, aluminum chloride and electrodesiccation   Outcome: patient tolerated procedure well   Post-procedure details: wound  care instructions given   Post-procedure details comment:  Ointment and small bandage applied  Specimen 1 - Surgical pathology Differential Diagnosis: r/o BCC Check Margins: No 4.70mm pink pearly papule  Right Postauricular Neck  Skin / nail biopsy Type of biopsy: tangential   Informed consent: discussed and consent obtained   Patient was prepped and draped in usual sterile fashion: Area prepped with alcohol. Anesthesia: the lesion was anesthetized in a standard fashion   Anesthetic:  0.5% bupivicaine w/ epinephrine 1-100,000 local infiltration Instrument used: flexible razor blade   Hemostasis achieved with: pressure, aluminum chloride and electrodesiccation   Outcome: patient tolerated procedure well   Post-procedure details: wound care instructions given   Post-procedure details comment:  Ointment and small bandage applied  Specimen 2 - Surgical pathology Differential Diagnosis: ISK r/o BCC Check Margins: No R post auricula neck  7.21mm pink brown slightly pearly papule  If R upper nasolabial +, plan MOHs at [facility].  AK (actinic keratosis) (13) Nose x 3, forehead x 9, R antihelix x 1  Prior to procedure, discussed risks of blister formation, small wound, skin dyspigmentation, or rare scar following cryotherapy.   Patient was given samples of Picato Gel at last visit. If he still has at home, he could use on forehead x 3. Also discussed PDT treatments to the face, 1 month apart.   Destruction of lesion - Nose x 3, forehead x 9, R antihelix x 1  Destruction method: cryotherapy   Informed consent: discussed and  consent obtained   Lesion destroyed using liquid nitrogen: Yes   Region frozen until ice ball extended beyond lesion: Yes   Outcome: patient tolerated procedure well with no complications   Post-procedure details: wound care instructions given    Inflamed seborrheic keratosis (2) Left Upper Temple x 1, R post shoulder x 1  Prior to procedure,  discussed risks of blister formation, small wound, skin dyspigmentation, or rare scar following cryotherapy.    Destruction of lesion - Left Upper Temple x 1, R post shoulder x 1  Destruction method: cryotherapy   Informed consent: discussed and consent obtained   Lesion destroyed using liquid nitrogen: Yes   Region frozen until ice ball extended beyond lesion: Yes   Outcome: patient tolerated procedure well with no complications   Post-procedure details: wound care instructions given    Actinic Damage - Severe, confluent actinic changes with pre-cancerous actinic keratoses  - Severe, chronic, not at goal, secondary to cumulative UV radiation exposure over time - diffuse scaly erythematous macules and papules with underlying dyspigmentation - Discussed Prescription "Field Treatment" for Severe, Chronic Confluent Actinic Changes with Pre-Cancerous Actinic Keratoses Field treatment involves treatment of an entire area of skin that has confluent Actinic Changes (Sun/ Ultraviolet light damage) and PreCancerous Actinic Keratoses by method of PhotoDynamic Therapy (PDT) and/or prescription Topical Chemotherapy agents such as 5-fluorouracil, 5-fluorouracil/calcipotriene, and/or imiquimod.  The purpose is to decrease the number of clinically evident and subclinical PreCancerous lesions to prevent progression to development of skin cancer by chemically destroying early precancer changes that may or may not be visible.  It has been shown to reduce the risk of developing skin cancer in the treated area. As a result of treatment, redness, scaling, crusting, and open sores may occur during treatment course. One or more than one of these methods may be used and may have to be used several times to control, suppress and eliminate the PreCancerous changes. Discussed treatment course, expected reaction, and possible side effects. - Recommend daily broad spectrum sunscreen SPF 30+ to sun-exposed areas, reapply every 2  hours as needed.  - Staying in the shade or wearing long sleeves, sun glasses (UVA+UVB protection) and wide brim hats (4-inch brim around the entire circumference of the hat) are also recommended. - Call for new or changing lesions. - pt will schedule in fall.  History of Basal Cell Carcinoma of the Skin - No evidence of recurrence today - Recommend regular full body skin exams - Recommend daily broad spectrum sunscreen SPF 30+ to sun-exposed areas, reapply every 2 hours as needed.  - Call if any new or changing lesions are noted between office visits  Seborrheic Keratoses - Stuck-on, waxy, tan-brown papules and/or plaques  - Benign-appearing - Discussed benign etiology and prognosis. - Observe - Call for any changes  Return in about 3 months (around 05/20/2021) for AKs.  [name], CMA, am acting as scribe for [name], MD .  Documentation: I have reviewed the above documentation for accuracy and completeness, and I agree with the above.  [name] MD

## 2021-02-17 NOTE — Patient Instructions (Addendum)
Wound Care Instructions  1. Cleanse wound gently with soap and water once a day then pat dry with clean gauze. Apply a thing coat of Petrolatum (petroleum jelly, "Vaseline") over the wound (unless you have an allergy to this). We recommend that you use a new, sterile tube of Vaseline. Do not pick or remove scabs. Do not remove the yellow or white "healing tissue" from the base of the wound.  2. Cover the wound with fresh, clean, nonstick gauze and secure with paper tape. You may use Band-Aids in place of gauze and tape if the would is small enough, but would recommend trimming much of the tape off as there is often too much. Sometimes Band-Aids can irritate the skin.  3. You should call the office for your biopsy report after 1 week if you have not already been contacted.  4. If you experience any problems, such as abnormal amounts of bleeding, swelling, significant bruising, significant pain, or evidence of infection, please call the office immediately.  5. FOR ADULT SURGERY PATIENTS: If you need something for pain relief you may take 1 extra strength Tylenol (acetaminophen) AND 2 Ibuprofen (200mg  each) together every 4 hours as needed for pain. (do not take these if you are allergic to them or if you have a reason you should not take them.) Typically, you may only need pain medication for 1 to 3 days.    Cryotherapy Aftercare  . Wash gently with soap and water everyday.   Marland Kitchen Apply Vaseline and Band-Aid daily until healed.  Start Picato 0.015% Gel Apply to face at night x 3. Samples given (1 box), exp 11/2020. Start Picato 0.05% Gel Apply 1 tube per leg at night x 2. Samples given (2 boxes), exp 2020.  If you have any questions or concerns for your doctor, please call our main line at [phone] and press option 4 to reach your doctor's medical assistant. If no one answers, please leave a voicemail as directed and we will return your call as soon as possible. Messages left after 4 pm will be  answered the following business day.   You may also send [place] a message via [place]. We typically respond to MyChart messages within 1-2 business days.  For prescription refills, please ask your pharmacy to contact our office. Our fax number is [phone].  If you have an urgent issue when the clinic is closed that cannot wait until the next business day, you can page your doctor at the number below.    Please note that while we do our best to be available for urgent issues outside of office hours, we are not available 24/7.   If you have an urgent issue and are unable to reach [place], you may choose to seek medical care at your doctor's office, retail clinic, urgent care center, or emergency room.  If you have a medical emergency, please immediately call 911 or go to the emergency department.  Pager Numbers  - Dr. Nehemiah Massed: [phone]  - Dr. Laurence Ferrari: [phone]  - Dr. Nicole Kindred: [phone]  In the event of inclement weather, please call our main line at [phone] for an update on the status of any delays or closures.  Dermatology Medication Tips: Please keep the boxes that topical medications come in in order to help keep track of the instructions about where and how to use these. Pharmacies typically print the medication instructions only on the boxes and not directly on the medication tubes.   If your medication is too  expensive, please contact our office at [phone] option 4 or send [place] a message through [place].   We are unable to tell what your co-pay for medications will be in advance as this is different depending on your insurance coverage. However, we may be able to find a substitute medication at lower cost or fill out paperwork to get insurance to cover a needed medication.   If a prior authorization is required to get your medication covered by your insurance company, please allow [place] 1-2 business days to complete this process.  Drug prices often vary depending on  where the prescription is filled and some pharmacies may offer cheaper prices.  The website www.goodrx.com contains coupons for medications through different pharmacies. The prices here do not account for what the cost may be with help from insurance (it may be cheaper with your insurance), but the website can give you the price if you did not use any insurance.  - You can print the associated coupon and take it with your prescription to the pharmacy.  - You may also stop by our office during regular business hours and pick up a GoodRx coupon card.  - If you need your prescription sent electronically to a different pharmacy, notify our office through [facility] or by phone at [phone] option 4.

## 2021-02-24 ENCOUNTER — Telehealth: Payer: Self-pay

## 2021-02-24 NOTE — Telephone Encounter (Signed)
Left message for patient to call for biopsy results. 

## 2021-02-24 NOTE — Telephone Encounter (Signed)
-----   Message from [name], MD sent at 02/24/2021  8:26 AM EDT ----- 1. Skin , right upper nasolabial BASAL CELL CARCINOMA, NODULAR PATTERN, BASE INVOLVED 2. Skin , right postauricular neck BASAL CELL CARCINOMA, NODULAR PATTERN, BASE INVOLVED  1. BCC skin cancer- needs [facility] 2. BCC skin cancer- needs [facility]

## 2021-02-25 ENCOUNTER — Telehealth: Payer: Self-pay

## 2021-02-25 DIAGNOSIS — C44310 Basal cell carcinoma of skin of unspecified parts of face: Secondary | ICD-10-CM

## 2021-02-25 DIAGNOSIS — C44311 Basal cell carcinoma of skin of nose: Secondary | ICD-10-CM

## 2021-02-25 NOTE — Telephone Encounter (Signed)
-----   Message from Tara Stewart, MD sent at 02/24/2021  8:26 AM EDT ----- 1. Skin , right upper nasolabial BASAL CELL CARCINOMA, NODULAR PATTERN, BASE INVOLVED 2. Skin , right postauricular neck BASAL CELL CARCINOMA, NODULAR PATTERN, BASE INVOLVED  1. BCC skin cancer- needs [facility] 2. BCC skin cancer- needs [facility] 

## 2021-02-25 NOTE — Telephone Encounter (Signed)
Patient advised of BX results. Patient has had MOHs done in the past at [facility]. Referral placed and information sent. Patient is scheduled for [facility].

## 2021-02-26 NOTE — Addendum Note (Signed)
Addended by: Johnsie Kindred R on: 02/26/2021 08:55 AM   Modules accepted: Orders

## 2021-03-08 ENCOUNTER — Ambulatory Visit (HOSPITAL_COMMUNITY)
Admission: EM | Admit: 2021-03-08 | Discharge: 2021-03-08 | Disposition: A | Discharge status: Home / Self Care | Payer: Medicare Other | Attending: Emergency Medicine | Admitting: Emergency Medicine

## 2021-03-08 ENCOUNTER — Encounter (HOSPITAL_COMMUNITY): Payer: Self-pay

## 2021-03-08 DIAGNOSIS — L03032 Cellulitis of left toe: Secondary | ICD-10-CM

## 2021-03-08 MED ORDER — DOXYCYCLINE HYCLATE 100 MG PO CAPS: 100.0000 mg | ORAL_CAPSULE | Freq: Two times a day (BID) | ORAL | 0 refills | Status: DC

## 2021-03-08 NOTE — ED Provider Notes (Signed)
[place]    CSN: [id number] Arrival date & time: 03/08/21  1017      History   Chief Complaint Chief Complaint  Patient presents with  . Toe Pain    HPI Jimmy Rangel is a 75 y.o. male.   Patient here for evaluation of left great toe redness and swelling that has been ongoing for the past several days.  Reports redness has spread to the top of his toe.  Has not tried any OTC medications or treatments.  Denies any trauma, injury, or other precipitating event.  Denies any specific alleviating or aggravating factors.  Has an appointment with podiatry in June.  Denies any fevers, chest pain, shortness of breath, N/V/D, numbness, tingling, weakness, abdominal pain, or headaches.    The history is provided by the patient.  Toe Pain    Past Medical History:  Diagnosis Date  . Actinic keratosis   . Arthritis   . Basal cell carcinoma 10/13/2006   left temple  . Basal cell carcinoma 10/13/2006   right anterior deltoid  . Basal cell carcinoma 08/16/2007   left distal posterior deltoid  . Basal cell carcinoma 07/31/2009   left zygomatic lat canthus   . Basal cell carcinoma 07/14/2010   right medial chest, EDC  . Basal cell carcinoma 02/26/2020   left sideburn ED&C  . Basal cell carcinoma 02/26/2020   left pretibia ED&C  . Basal cell carcinoma 07/02/2020   L mid pretibia  . Basal cell carcinoma 05/15/2020   left mid pretibia  superficial   . Basal cell carcinoma 02/17/2021   right posterior neck, schedule EDC  . Basal cell carcinoma (BCC) 02/17/2021   right upper nasolabial, schedule MOHs  . 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 Calcification of aortic valve 09/20/2018   Aortic valve: Trileaflet; moderately thickened, moderately   calcified leaflets. Valve mobility was restricted.  . Chronic back pain    stenosis  . GERD (gastroesophageal reflux disease)    takes Omeprazole daily  . Headache    rare  . Heart murmur   .  History of bronchitis as a child   . Hyperlipidemia    takes Atorvastatin daily  . 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 Primary localized osteoarthritis of right knee   . Sleep apnea    uses CPAP, does not know settings  . Superficial basal cell carcinoma 08/16/2007   left distal anterior deltoid  . Superficial basal cell carcinoma 08/16/2007   left mid lateral bicep  . Superficial basal cell carcinoma 02/18/2009   right medial pretibial  . Superficial basal cell carcinoma 02/18/2009   right lateral calf  . Weakness    left leg    Patient Active Problem List   Diagnosis Date Noted  . Calcification of aortic valve 09/20/2018  . Pre-operative cardiovascular examination 09/15/2018  . Primary localized osteoarthritis of right knee   . Gastroesophageal reflux disease 08/26/2018  . Hernia of abdominal wall 08/26/2018  . 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 Acute meniscal tear of right knee 02/07/2018  . Complex tear of medial meniscus of right knee as current injury 02/04/2018  . Effusion of right knee 12/29/2017  . 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 Pure hypercholesterolemia 05/30/2015    Past Surgical History:  Procedure Laterality Date  . BACK SURGERY     Lumbar  fusion x2, 1997, 2016  . CHOLECYSTECTOMY    . COLONOSCOPY    . ESOPHAGOGASTRODUODENOSCOPY    . INCISIONAL HERNIA REPAIR N/A 08/09/2020   Procedure: LAPAROSCOPIC REPAIR INCISIONAL HERNIA WITH MESH AND LYSIS OF ADHESIONS;  Surgeon: Greer Pickerel, MD;  Location: [facility];  Service: General;  Laterality: N/A;  . INJECTION KNEE Left 09/19/2018   Procedure: LEFT KNEE INJECTION;  Surgeon: Elsie Saas, MD;  Location: [place];  Service: Orthopedics;  Laterality: Left;  . KNEE ARTHROSCOPY Right   . spleenectomy    . TOTAL KNEE ARTHROPLASTY Right 09/19/2018   Procedure: TOTAL KNEE ARTHROPLASTY;  Surgeon:  Elsie Saas, MD;  Location: [place];  Service: Orthopedics;  Laterality: Right;       Home Medications    Prior to Admission medications   Medication Sig Start Date End Date Taking? Authorizing Provider  doxycycline (VIBRAMYCIN) 100 MG capsule Take 1 capsule (100 mg total) by mouth 2 (two) times daily. 03/08/21  Yes Pearson Forster, NP  diclofenac (VOLTAREN) 50 MG EC tablet Take 50 mg by mouth daily.    [provider]  docusate sodium (COLACE) 100 MG capsule 1 tab 2 times a day while on narcotics.  STOOL SOFTENER Patient not taking: Reported on 01/02/2020 09/21/18   Shepperson, Kirstin, PA-C  fluticasone (FLONASE) 50 MCG/ACT nasal spray Place 2 sprays into both nostrils daily as needed for allergies or rhinitis.     [provider]  glucosamine-chondroitin 500-400 MG tablet Take 1 tablet by mouth 1 day or 1 dose.    [provider]  GLUCOSAMINE-CHONDROITIN PO Take 1 tablet by mouth daily.    [provider]  Multiple Vitamin (MULTIVITAMIN WITH MINERALS) TABS tablet Take 1 tablet by mouth daily.    [provider]  Omega-3 1000 MG CAPS Take 1,000 mg by mouth daily.    [provider]  omeprazole (PRILOSEC) 20 MG capsule Take 20 mg by mouth daily.    [provider]  oxyCODONE (OXY IR/ROXICODONE) 5 MG immediate release tablet Take 1 tablet (5 mg total) by mouth every 6 (six) hours as needed for severe pain. 08/09/20   Greer Pickerel, MD  rosuvastatin (CRESTOR) 10 MG tablet Take 10 mg by mouth 1 day or 1 dose.    [provider]    Family History Family History  Problem Relation Age of Onset  . Leukemia Mother   . Lung cancer Father     Social History Social History   Tobacco Use  . Smoking status: Never Smoker  . Smokeless tobacco: Never Used  Vaping Use  . Vaping Use: Never used  Substance Use Topics  . Alcohol use: Yes    Alcohol/week: 6.0 - 7.0 standard drinks    Types: 6 - 7 Glasses of wine per week     Comment: wine nightly  . Drug use: No     Allergies   Atorvastatin and Penicillins   Review of Systems Review of Systems  Skin: Positive for color change.  All other systems reviewed and are negative.    Physical Exam Triage Vital Signs ED Triage Vitals  Enc Vitals Group     BP 03/08/21 1125 137/78     Pulse Rate 03/08/21 1125 67     Resp 03/08/21 1125 19     Temp 03/08/21 1125 97.9 F (36.6 C)     Temp Source 03/08/21 1125 Oral     SpO2 03/08/21 1125 98 %     Weight --  Height --      Head Circumference --      Peak Flow --      Pain Score 03/08/21 1124 0     Pain Loc --      Pain Edu? --      Excl. in [place]? --    No data found.  Updated Vital Signs BP 137/78 (BP Location: Left Arm)   Pulse 67   Temp 97.9 F (36.6 C) (Oral)   Resp 19   SpO2 98%   Visual Acuity Right Eye Distance:   Left Eye Distance:   Bilateral Distance:    Right Eye Near:   Left Eye Near:    Bilateral Near:     Physical Exam Vitals and nursing note reviewed.  Constitutional:      General: He is not in acute distress.    Appearance: Normal appearance. He is not ill-appearing, toxic-appearing or diaphoretic.  HENT:     Head: Normocephalic and atraumatic.  Eyes:     Conjunctiva/sclera: Conjunctivae normal.  Cardiovascular:     Rate and Rhythm: Normal rate.     Pulses: Normal pulses.  Pulmonary:     Effort: Pulmonary effort is normal.  Abdominal:     General: Abdomen is flat.  Musculoskeletal:        General: Normal range of motion.     Cervical back: Normal range of motion.  Skin:    General: Skin is warm and dry.     Comments: Left great toe with swelling and redness, see photo below  Neurological:     General: No focal deficit present.     Mental Status: He is alert and oriented to person, place, and time.  Psychiatric:        Mood and Affect: Mood normal.          UC Treatments / Results  Labs (all labs ordered are listed, but only abnormal results are  displayed) Labs Reviewed - No data to display  EKG   Radiology No results found.  Procedures Procedures (including critical care time)  Medications Ordered in UC Medications - No data to display  Initial Impression / Assessment and Plan / UC Course  I have reviewed the triage vital signs and the nursing notes.  Pertinent labs & imaging results that were available during my care of the patient were reviewed by me and considered in my medical decision making (see chart for details).    Assessment negative for red flags or concerns.  Likely cellulitis to left great toe, will treat with doxycycline twice a day for the next 10 days.  Recommend epsom salt soaks or warm compress 3-4 times a day for comfort. Return for any worsening symptoms.  Follow up with podiatry as scheduled.  Final Clinical Impressions(s) / UC Diagnoses   Final diagnoses:  Cellulitis of toe of left foot     Discharge Instructions     Take the doxycycline twice a day for the next 10 days.   You can take Ibuprofen and/or Tylenol as needed for pain relief and fever reduction.    Soak your foot in a epsom salt soak or apply a warm compress 3-4 times a day for comfort.   If the redness or swelling gets any worse, return or go to the Emergency Department for further evaluation.    Follow up with Podiatry as scheduled.     ED Prescriptions    Medication Sig Dispense Auth. Provider   doxycycline (VIBRAMYCIN) 100 MG  capsule Take 1 capsule (100 mg total) by mouth 2 (two) times daily. 20 capsule Pearson Forster, NP     PDMP not reviewed this encounter.   Pearson Forster, NP 03/08/21 1205

## 2021-03-08 NOTE — ED Triage Notes (Signed)
Pt c/o a left toe pain. Pt presents with redness and swelling. Pt states he has an appointment with a podiatrist in June.

## 2021-03-08 NOTE — Discharge Instructions (Signed)
Take the doxycycline twice a day for the next 10 days.   You can take Ibuprofen and/or Tylenol as needed for pain relief and fever reduction.    Soak your foot in a epsom salt soak or apply a warm compress 3-4 times a day for comfort.   If the redness or swelling gets any worse, return or go to the Emergency Department for further evaluation.    Follow up with Podiatry as scheduled.

## 2021-03-18 ENCOUNTER — Ambulatory Visit: Payer: Self-pay | Admitting: Podiatry

## 2021-04-02 ENCOUNTER — Other Ambulatory Visit: Payer: Self-pay

## 2021-04-02 ENCOUNTER — Ambulatory Visit: Payer: Medicare Other | Admitting: Dermatology

## 2021-04-02 ENCOUNTER — Encounter: Payer: Self-pay | Admitting: Dermatology

## 2021-04-02 DIAGNOSIS — L578 Other skin changes due to chronic exposure to nonionizing radiation: Secondary | ICD-10-CM

## 2021-04-02 DIAGNOSIS — C44311 Basal cell carcinoma of skin of nose: Secondary | ICD-10-CM

## 2021-04-02 DIAGNOSIS — C4441 Basal cell carcinoma of skin of scalp and neck: Secondary | ICD-10-CM

## 2021-04-02 NOTE — Patient Instructions (Signed)
Wound Care Instructions  Cleanse wound gently with soap and water once a day then pat dry with clean gauze. Apply a thing coat of Petrolatum (petroleum jelly, "Vaseline") over the wound (unless you have an allergy to this). We recommend that you use a new, sterile tube of Vaseline. Do not pick or remove scabs. Do not remove the yellow or white "healing tissue" from the base of the wound.  Cover the wound with fresh, clean, nonstick gauze and secure with paper tape. You may use Band-Aids in place of gauze and tape if the would is small enough, but would recommend trimming much of the tape off as there is often too much. Sometimes Band-Aids can irritate the skin.  You should call the office for your biopsy report after 1 week if you have not already been contacted.  If you experience any problems, such as abnormal amounts of bleeding, swelling, significant bruising, significant pain, or evidence of infection, please call the office immediately.  FOR ADULT SURGERY PATIENTS: If you need something for pain relief you may take 1 extra strength Tylenol (acetaminophen) AND 2 Ibuprofen (200mg each) together every 4 hours as needed for pain. (do not take these if you are allergic to them or if you have a reason you should not take them.) Typically, you may only need pain medication for 1 to 3 days.   If you have any questions or concerns for your doctor, please call our main line at [phone] and press option 4 to reach your doctor's medical assistant. If no one answers, please leave a voicemail as directed and we will return your call as soon as possible. Messages left after 4 pm will be answered the following business day.   You may also send us a message via MyChart. We typically respond to MyChart messages within 1-2 business days.  For prescription refills, please ask your pharmacy to contact our office. Our fax number is [phone].  If you have an urgent issue when the clinic is closed that  cannot wait until the next business day, you can page your doctor at the number below.    Please note that while we do our best to be available for urgent issues outside of office hours, we are not available 24/7.   If you have an urgent issue and are unable to reach us, you may choose to seek medical care at your doctor's office, retail clinic, urgent care center, or emergency room.  If you have a medical emergency, please immediately call 911 or go to the emergency department.  Pager Numbers  - Dr. Kowalski: [phone]  - Dr. Moye: [phone]  - Dr. Stewart: [phone]  In the event of inclement weather, please call our main line at [phone] for an update on the status of any delays or closures.  Dermatology Medication Tips: Please keep the boxes that topical medications come in in order to help keep track of the instructions about where and how to use these. Pharmacies typically print the medication instructions only on the boxes and not directly on the medication tubes.   If your medication is too expensive, please contact our office at [phone] option 4 or send us a message through MyChart.   We are unable to tell what your co-pay for medications will be in advance as this is different depending on your insurance coverage. However, we may be able to find a substitute medication at lower cost or fill out paperwork to get insurance to cover a needed   medication.   If a prior authorization is required to get your medication covered by your insurance company, please allow us 1-2 business days to complete this process.  Drug prices often vary depending on where the prescription is filled and some pharmacies may offer cheaper prices.  The website www.goodrx.com contains coupons for medications through different pharmacies. The prices here do not account for what the cost may be with help from insurance (it may be cheaper with your insurance), but the website can give you the  price if you did not use any insurance.  - You can print the associated coupon and take it with your prescription to the pharmacy.  - You may also stop by our office during regular business hours and pick up a GoodRx coupon card.  - If you need your prescription sent electronically to a different pharmacy, notify our office through [facility] MyChart or by phone at [phone] option 4.   

## 2021-04-02 NOTE — Progress Notes (Signed)
   Follow-Up Visit   Subjective  Jimmy Rangel is a 75 y.o. male who presents for the following: Basal Cell Carcinoma (Right postauricular neck. Patient [facility] He also has biopsy proven BCC on R NL fold and is scheduled for Mohs.   The following portions of the chart were reviewed this encounter and updated as appropriate:        Review of Systems:  No other skin or systemic complaints except as noted in HPI or Assessment and Plan.  Objective  Well appearing patient in no apparent distress; mood and affect are within normal limits.  A focused examination was performed including neck. Relevant physical exam findings are noted in the Assessment and Plan.  Right postauricular neck Pink biopsy site.  Right upper nasolabial Pink biopsy site.   Assessment & Plan  Basal cell carcinoma of skin of scalp and neck Right postauricular neck  Destruction of lesion  Destruction method: electrodesiccation and curettage   Informed consent: discussed and consent obtained   Timeout:  patient name, date of birth, surgical site, and procedure verified Anesthesia: the lesion was anesthetized in a standard fashion   Anesthetic:  1% lidocaine w/ epinephrine 1-100,000 local infiltration Curettage performed in three different directions: Yes   Electrodesiccation performed over the curetted area: Yes   Lesion length (cm):  0.7 Lesion width (cm):  0.7 Margin per side (cm):  0.2 Final wound size (cm):  1.1 Hemostasis achieved with:  pressure, aluminum chloride and electrodesiccation Outcome: patient tolerated procedure well with no complications   Post-procedure details: wound care instructions given   Post-procedure details comment:  Ointment and bandage applied.  Basal cell carcinoma (BCC) of skin of nose Right upper nasolabial  Biopsy proven.  Patient scheduled for [facility] with Dr Danielle Dess in [place]  Actinic Damage - chronic, secondary to cumulative UV radiation  exposure/sun exposure over time - diffuse scaly erythematous macules with underlying dyspigmentation - Recommend daily broad spectrum sunscreen SPF 30+ to sun-exposed areas, reapply every 2 hours as needed.  - Recommend staying in the shade or wearing long sleeves, sun glasses (UVA+UVB protection) and wide brim hats (4-inch brim around the entire circumference of the hat). - Call for new or changing lesions.   Return in about 3 months (around 07/03/2021) for AKs.  [name], CMA, am acting as scribe for [name], MD .  Documentation: I have reviewed the above documentation for accuracy and completeness, and I agree with the above.  [name] MD

## 2021-04-18 ENCOUNTER — Telehealth: Payer: Self-pay | Admitting: Cardiology

## 2021-04-18 NOTE — Telephone Encounter (Signed)
Jimmy Rangel is calling requesting a provider switch from Dr. Geraldo Pitter to Dr. Gwenlyn Found due to wanting to be seen in [facility] and Dr. Gwenlyn Found being the Cardiologist his mother saw. Please advise.

## 2021-05-06 ENCOUNTER — Other Ambulatory Visit: Payer: Self-pay | Admitting: Neurological Surgery

## 2021-05-06 DIAGNOSIS — M4712 Other spondylosis with myelopathy, cervical region: Secondary | ICD-10-CM

## 2021-05-06 DIAGNOSIS — M5440 Lumbago with sciatica, unspecified side: Secondary | ICD-10-CM

## 2021-05-06 DIAGNOSIS — Z981 Arthrodesis status: Secondary | ICD-10-CM

## 2021-05-11 ENCOUNTER — Ambulatory Visit
Admission: RE | Admit: 2021-05-11 | Discharge: 2021-05-11 | Disposition: A | Discharge status: Home / Self Care | Payer: Medicare Other | Source: Ambulatory Visit | Attending: Neurological Surgery | Admitting: Neurological Surgery

## 2021-05-11 DIAGNOSIS — M4712 Other spondylosis with myelopathy, cervical region: Secondary | ICD-10-CM

## 2021-05-11 DIAGNOSIS — M5440 Lumbago with sciatica, unspecified side: Secondary | ICD-10-CM

## 2021-05-11 IMAGING — MR MR LUMBAR SPINE W/O CM
4 of 5 series · 26 of 48 positions shown · non-contrast
Comparison: CT of the lumbar spine without contrast [DATE]

CLINICAL DATA: Lumbago of the lumbar region with sciatica. Patient
states weakness in the right lower extremity.

EXAM:
MRI LUMBAR SPINE WITHOUT CONTRAST
TECHNIQUE: Multiplanar, multisequence MR imaging of the lumbar spine was
performed. No intravenous contrast was administered.

[Series 3: T2 · sagittal · 4.0mm · 1.09mm/px · 5 of 17 slices shown (1 of 2)]
[im 1/17]
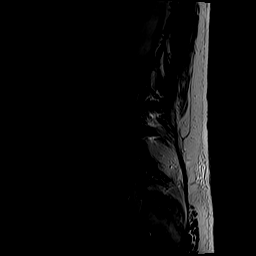
[im 5/17]
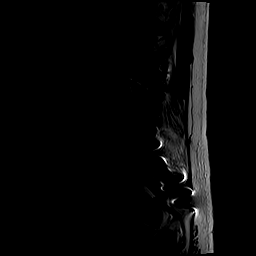
[im 9/17]
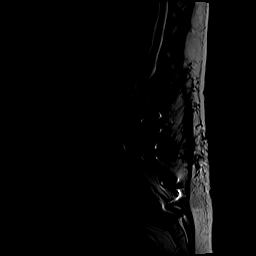
[im 13/17]
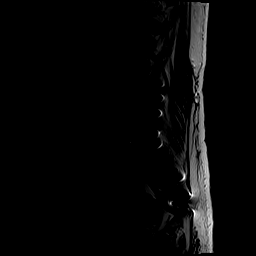
[im 17/17]
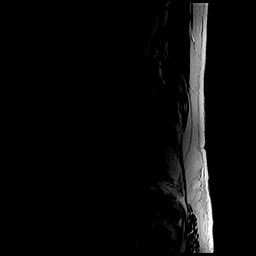

[Series 5: T1 · sagittal · 4.0mm · 1.09mm/px · 6 of 17 slices shown (1 of 2)]
[im 1/17]
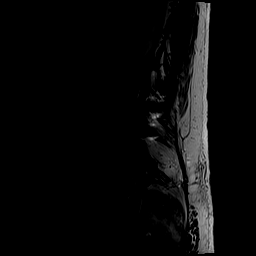
[im 4/17]
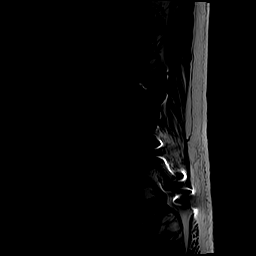
[im 7/17]
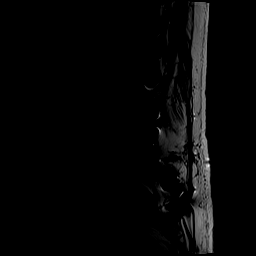
[im 10/17]
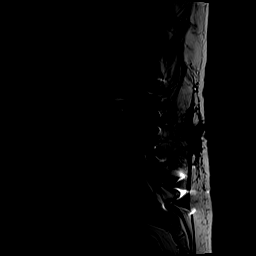
[im 13/17]
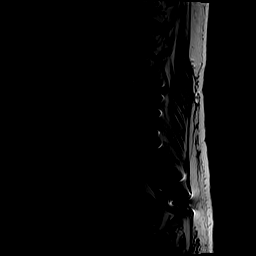
[im 17/17]
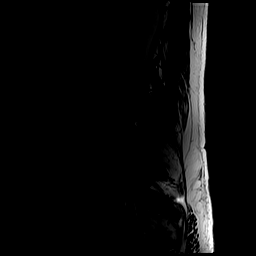

[Series 6: T2 · axial · 4.0mm · 0.39mm/px · z∈[-80,+169]mm · 10 of 47 slices shown (2 of 2)]
[im 4/47]
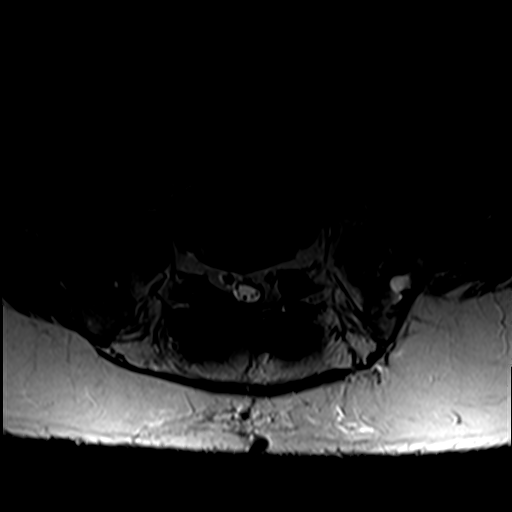
[im 7/47]
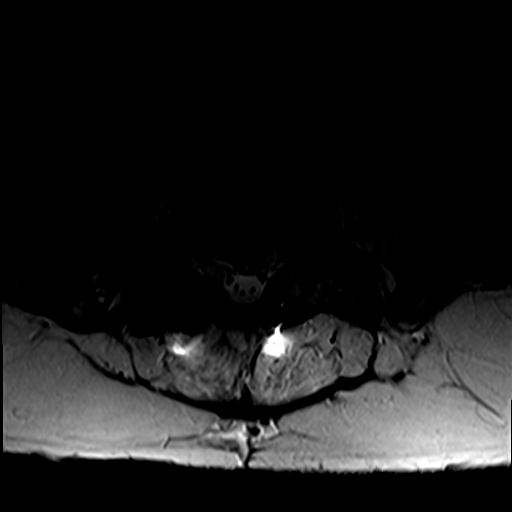
[im 10/47]
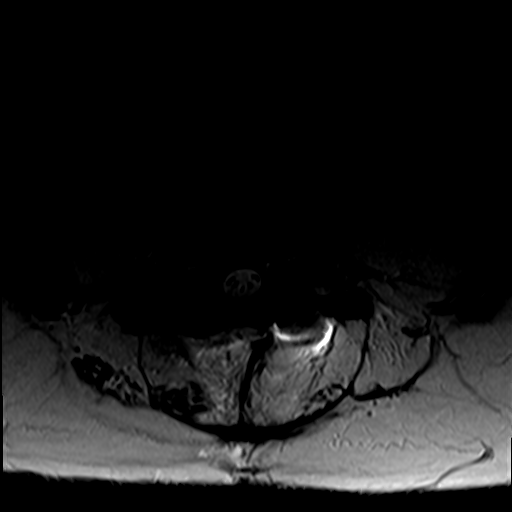
[im 16/47]
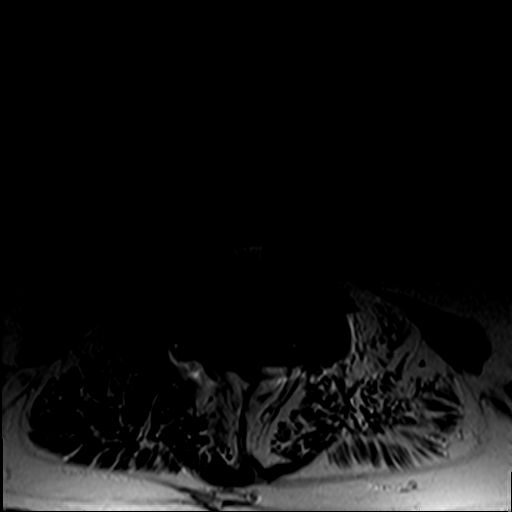
[im 22/47]
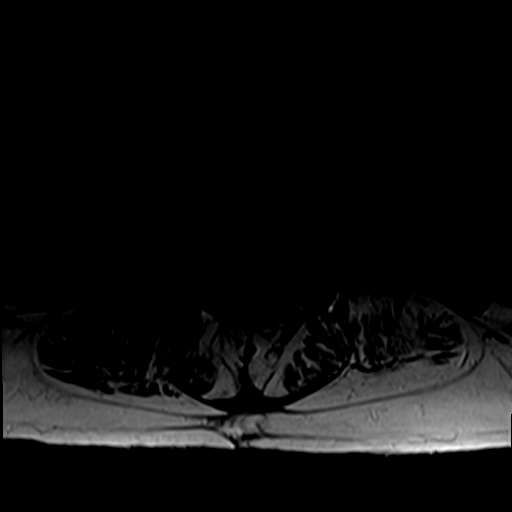
[im 25/47]
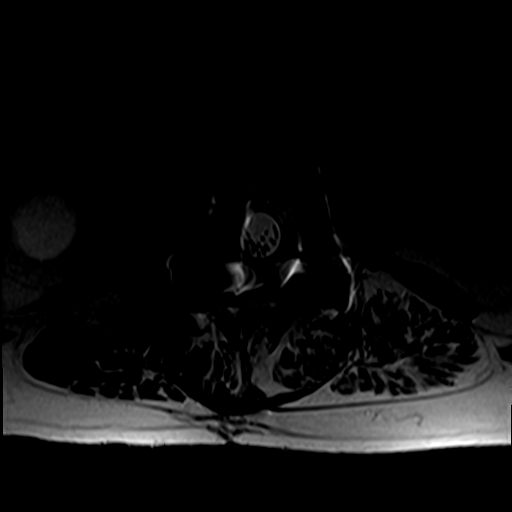
[im 28/47]
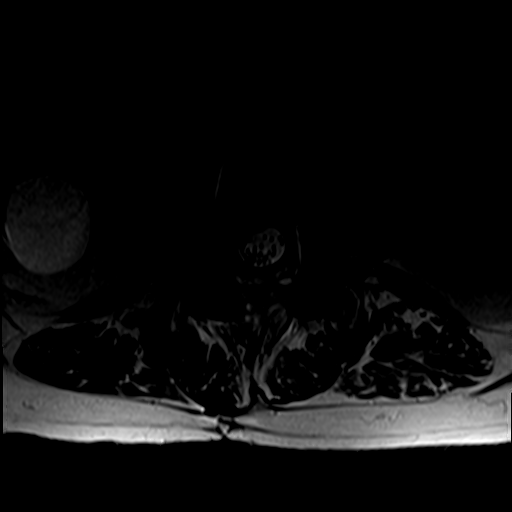
[im 34/47]
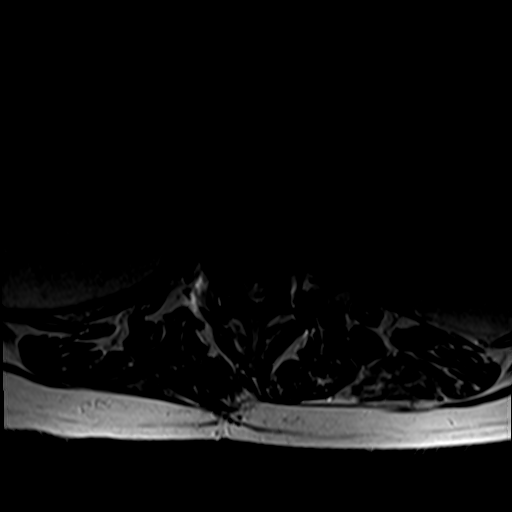
[im 40/47]
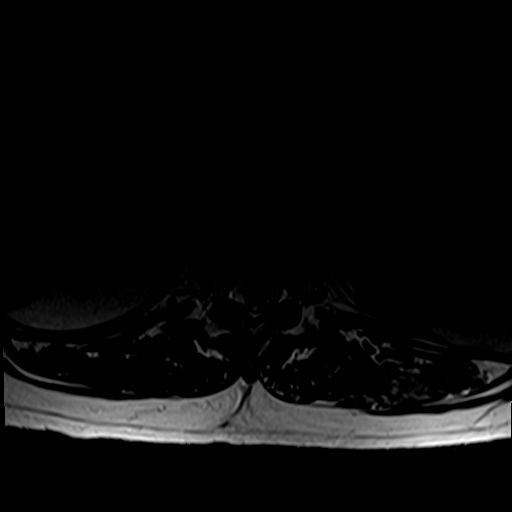
[im 47/47]
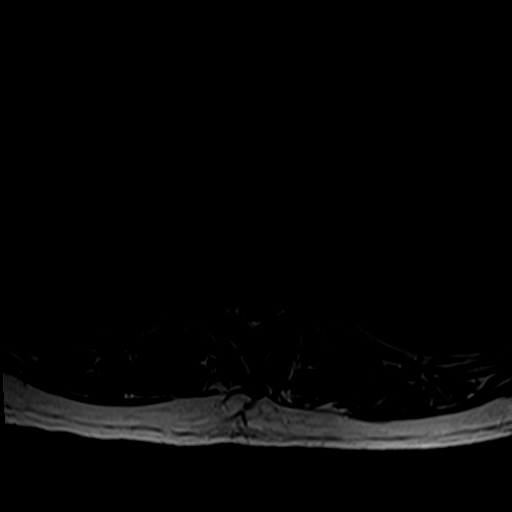

[Series 7: T1 · axial · 4.0mm · 0.39mm/px · z∈[-80,+136]mm · 5 of 47 slices shown (2 of 2)]
[im 4/47]
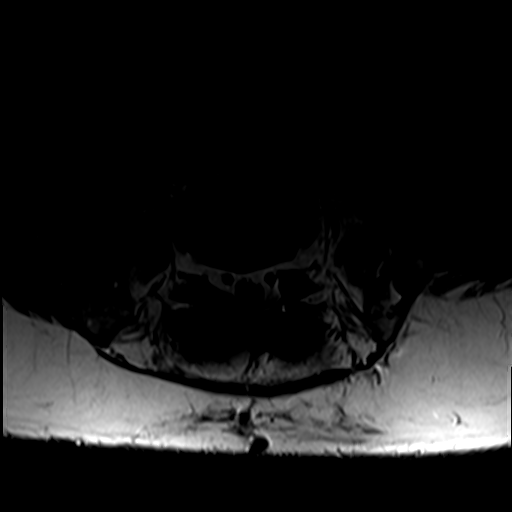
[im 7/47]
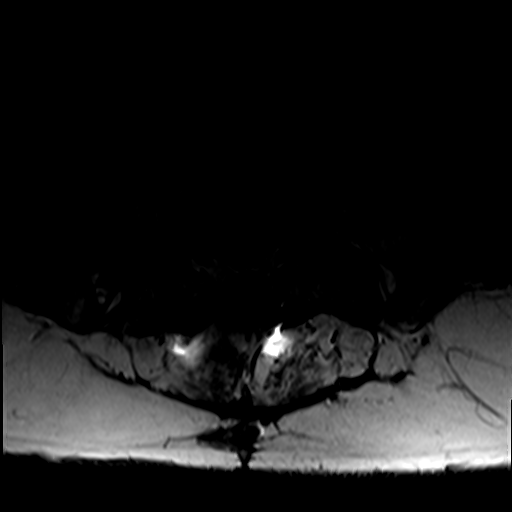
[im 10/47]
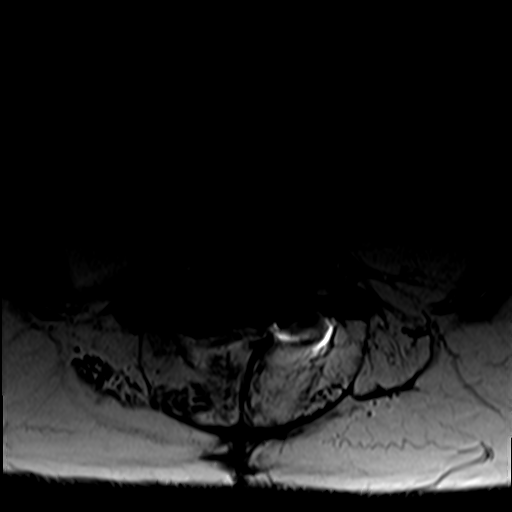
[im 25/47]
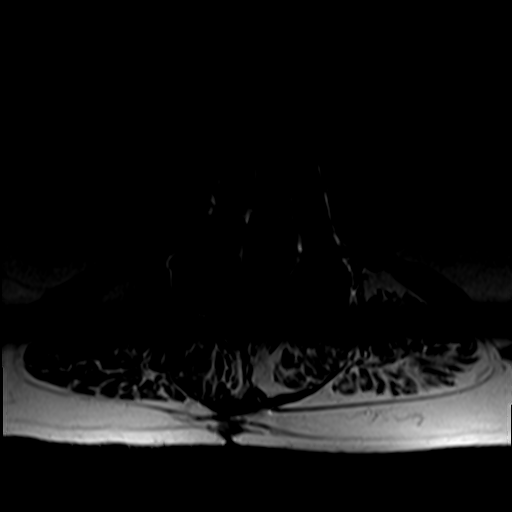
[im 40/47]
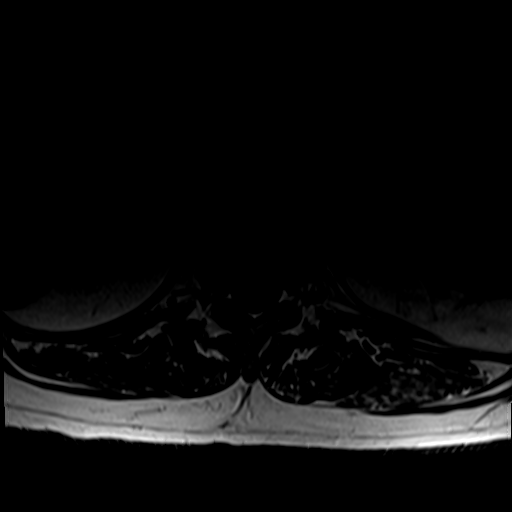

[26 of 48 positions shown; findings below may reference images not displayed]

FINDINGS: Segmentation: Transitional anatomy is present. Stable numbering
system utilized. The left fully formed vertebral body is labeled S1
as on the previous CT scan.

Alignment: Grade 2 anterolisthesis L5-S1 measures 13 mm. 5 mm
retrolisthesis has progressed at L1-2. Slight retrolisthesis is also
present at L3-4.

Vertebrae: Pedicle screw and rod fixation obscures marrow signal at
L2, L3 and L4. Mild edematous endplate changes are present at L1-2
with some progression

Conus medullaris and cauda equina: Conus extends to the L1-2 level.
Conus and cauda equina appear normal.

Paraspinal and other soft tissues: Cystic changes are noted in the
right kidney. Abdomen is otherwise unremarkable. Paraspinous muscle
atrophy noted below the operative level.

Disc levels:

L1-2: A progressive broad-based disc protrusion is associated with
the listhesis. Mild subarticular narrowing has progressed
bilaterally. Moderate bilateral foraminal narrowing is noted.

L2-3: Laminectomy infusion stable. No residual or recurrent
stenosis.

L3-4: Laminectomies stable. Solid posterior fusion scratched at
stable posterior fusion. No residual or recurrent stenosis is
present.

L4-5: Wide laminectomy present. Advanced facet hypertrophy present.
Stable moderate foraminal narrowing bilaterally.

L5-S1: Diffuse listhesis noted. Posterior elements fused. No
significant stenosis.

S1-2: No significant focal stenosis.
IMPRESSION: 1. Progressive broad-based disc protrusion at L1-2 with mild
subarticular narrowing bilaterally and moderate bilateral foraminal
stenosis. This is progressive adjacent level disease.
2. Laminectomies and fusion at L2-3 and L3-4 without residual or
recurrent stenosis.
3. Stable moderate foraminal stenosis bilaterally at L4-5. Central
canal is patent.
4. Transitional anatomy. The last fully formed vertebral body is
labeled S1 as on the previous CT scan.

## 2021-05-11 IMAGING — MR MR CERVICAL SPINE W/O CM
5 series · 35 of 48 positions shown · non-contrast
Comparison: Cervical radiographs [DATE].

CLINICAL DATA: Cervical spondylosis with myelopathy [V3]
([V3]-CM)

EXAM:
MRI CERVICAL SPINE WITHOUT CONTRAST
TECHNIQUE: Multiplanar, multisequence MR imaging of the cervical spine was
performed. No intravenous contrast was administered.

[Series 5: T2 · sagittal · 3.0mm · 0.41mm/px · 8 of 17 slices shown (1 of 2)]
[im 1/17]
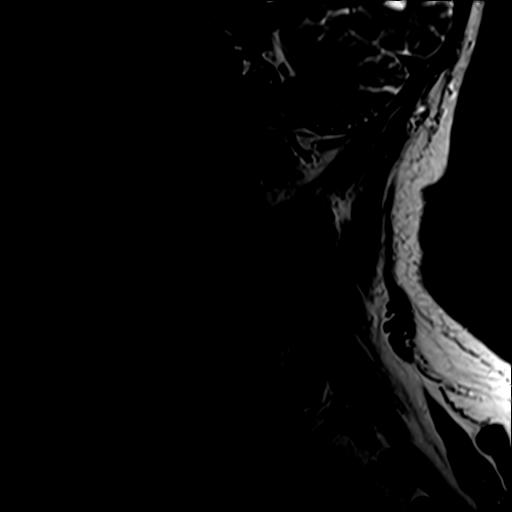
[im 3/17]
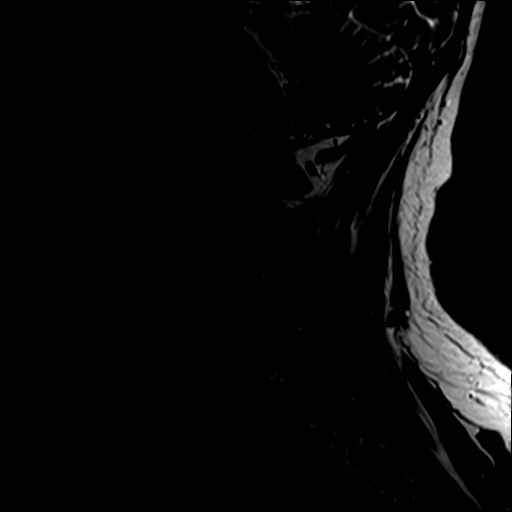
[im 5/17]
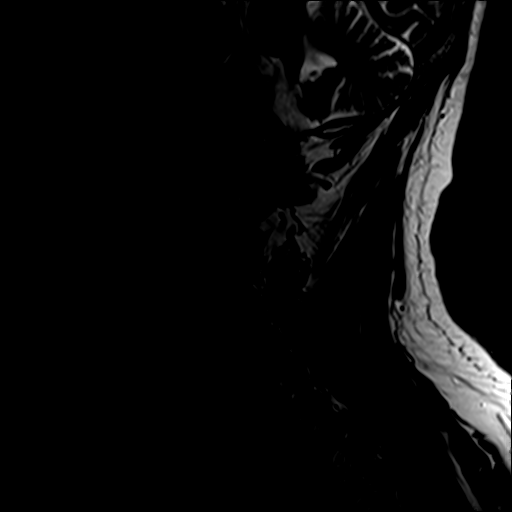
[im 7/17]
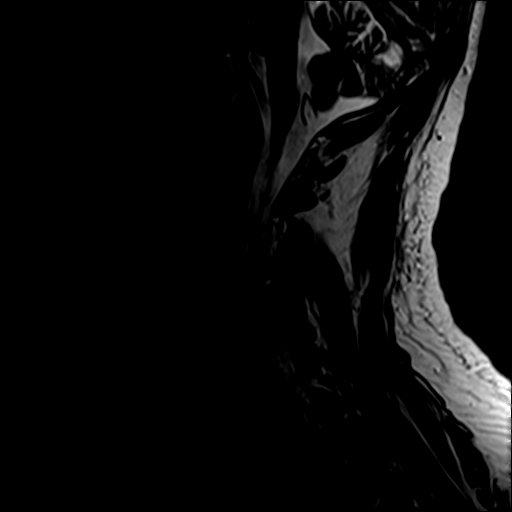
[im 10/17]
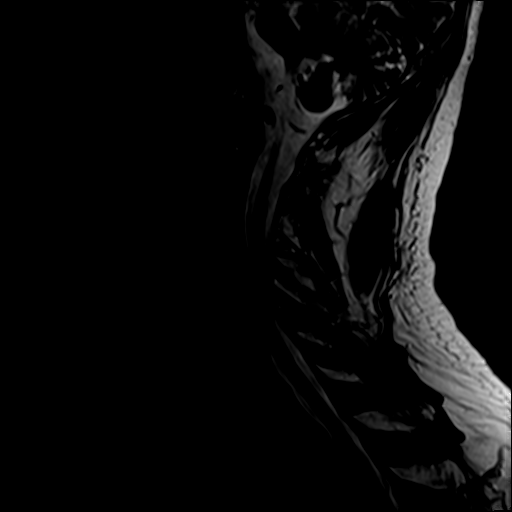
[im 12/17]
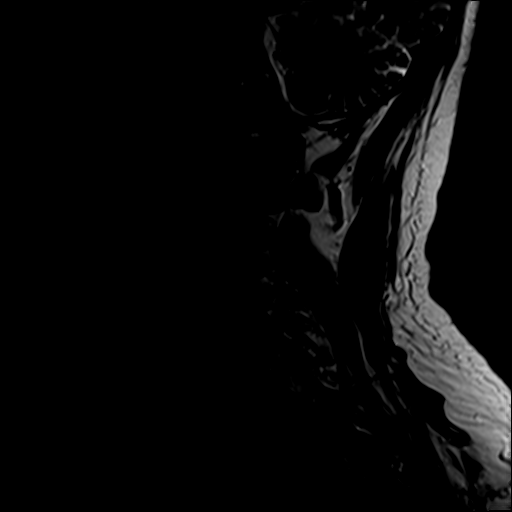
[im 14/17]
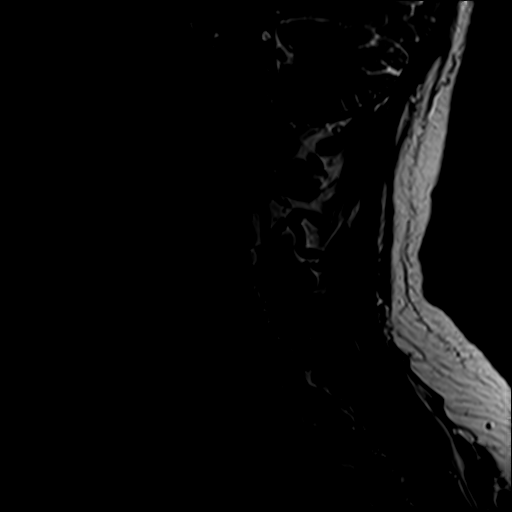
[im 17/17]
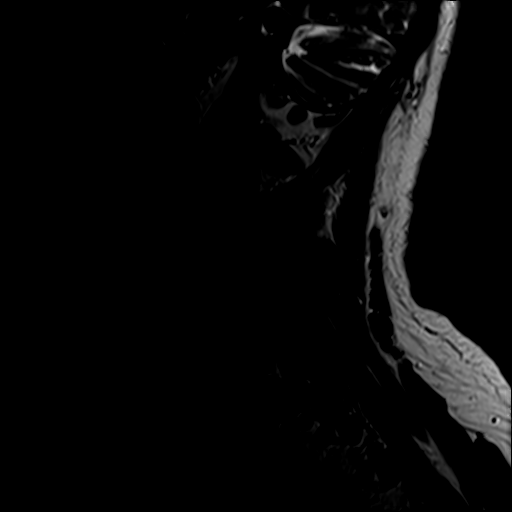

[Series 6: STIR · sagittal · 3.0mm · 0.82mm/px · 7 of 17 slices shown]
[im 1/17]
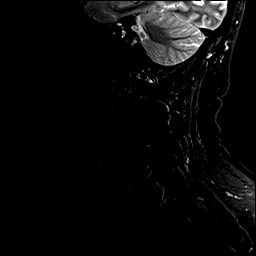
[im 3/17]
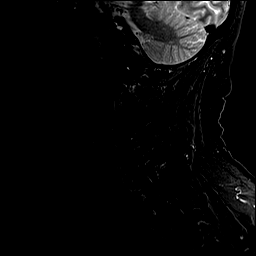
[im 6/17]
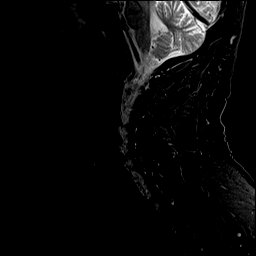
[im 9/17]
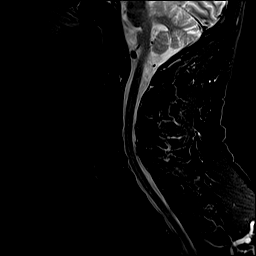
[im 11/17]
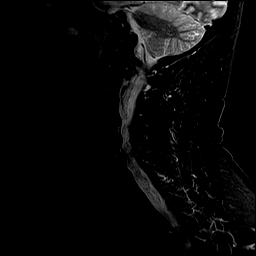
[im 14/17]
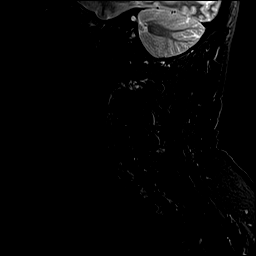
[im 17/17]
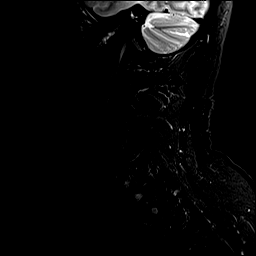

[Series 7: T1 · sagittal · 3.0mm · 0.82mm/px · 7 of 17 slices shown]
[im 1/17]
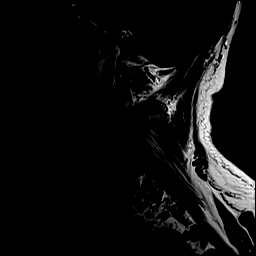
[im 3/17]
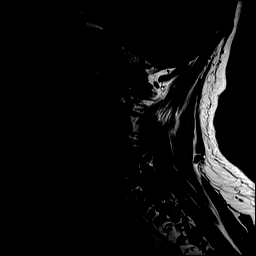
[im 6/17]
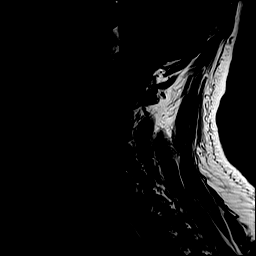
[im 9/17]
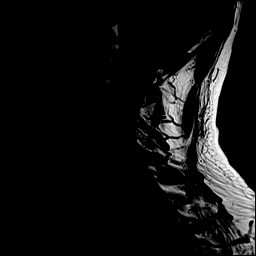
[im 11/17]
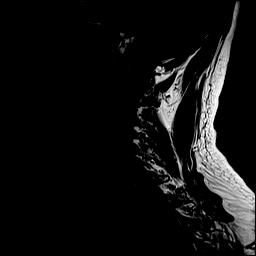
[im 14/17]
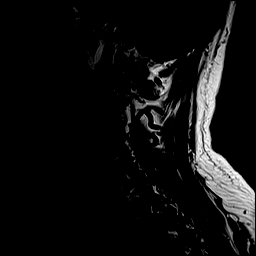
[im 17/17]
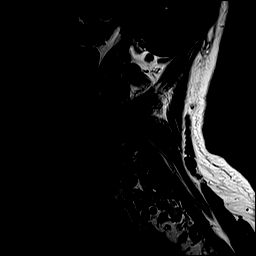

[Series 8: T2 · axial · 3.0mm · 0.70mm/px · z∈[-87,+25]mm · 9 of 32 slices shown (2 of 2)]
[im 1/32]
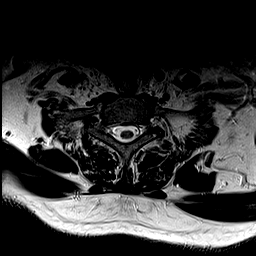
[im 6/32]
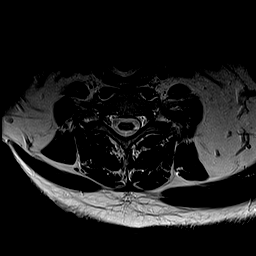
[im 11/32]
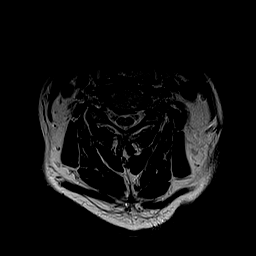
[im 13/32]
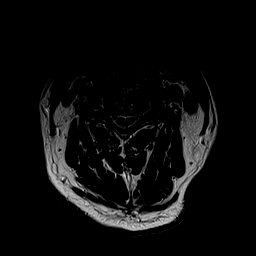
[im 16/32]
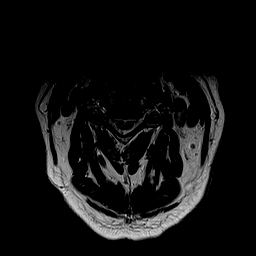
[im 19/32]
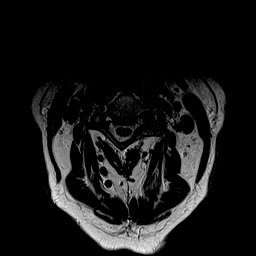
[im 21/32]
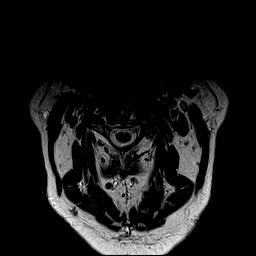
[im 26/32]
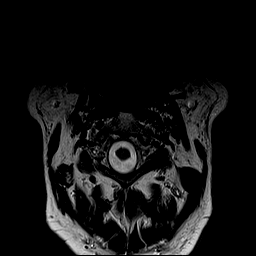
[im 32/32]
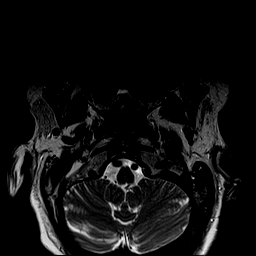

[Series 9: GRE · axial · 3.0mm · 0.35mm/px · z∈[-87,-44]mm · 4 of 32 slices shown]
[im 1/32]
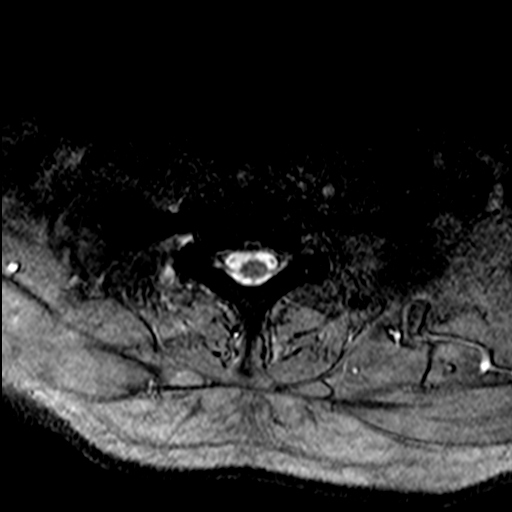
[im 6/32]
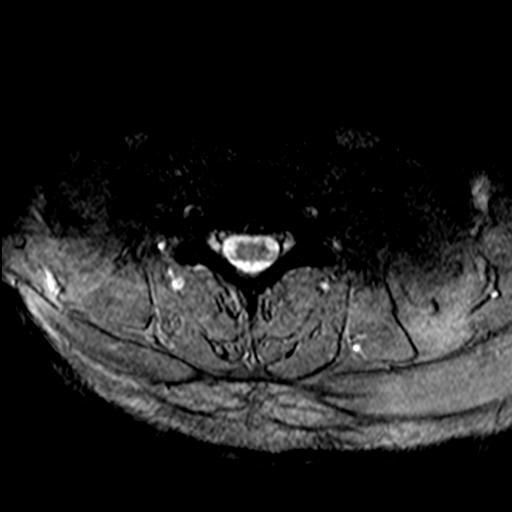
[im 11/32]
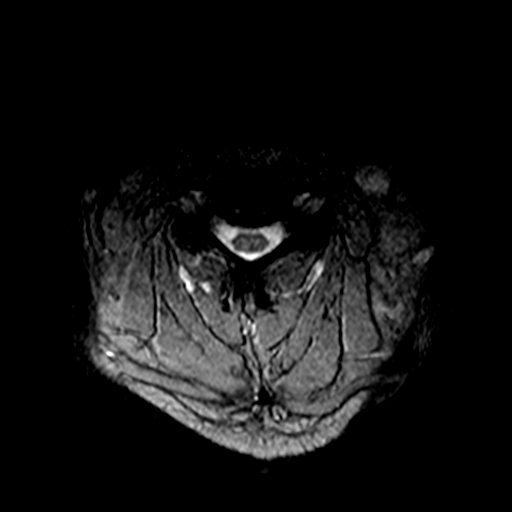
[im 13/32]
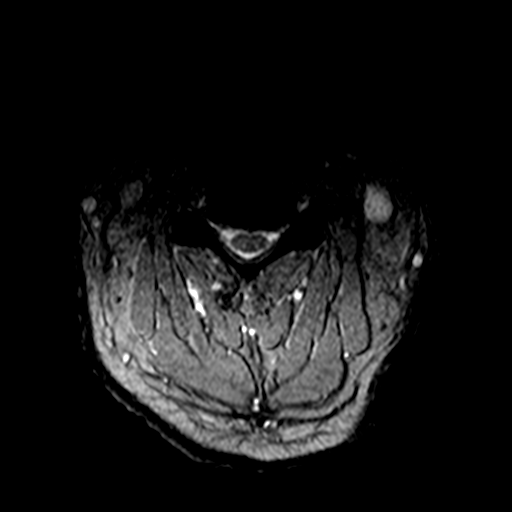

[35 of 48 positions shown; findings below may reference images not displayed]

FINDINGS: Alignment: No substantial sagittal subluxation.

Vertebrae: No evidence of acute fracture, discitis/osteomyelitis, or
suspicious bone lesion.

Cord: Normal cord signal.

Posterior Fossa, vertebral arteries, paraspinal tissues: Visualized
vertebral artery flow voids are maintained.

Disc levels:

C2-C3: No significant disc protrusion, foraminal stenosis, or canal
stenosis.

C3-C4: Posterior disc osteophyte complex with bilateral
uncovertebral hypertrophy. Resulting mild to moderate bilateral
foraminal stenosis without significant canal stenosis.

C4-C5: Posterior disc osteophyte complex with bilateral
uncovertebral hypertrophy. Resulting moderate bilateral foraminal
stenosis without significant canal stenosis.

C5-C6: Posterior disc osteophyte complex which contacts and flattens
the ventral cord. Mild canal stenosis. No significant foraminal
stenosis.

C6-C7: Posterior disc osteophyte complex and bilateral facet
arthropathy. No significant canal or foraminal stenosis.

C7-T1: No significant disc protrusion, foraminal stenosis, or canal
stenosis.
IMPRESSION: 1. Moderate bilateral foraminal stenosis C4-C5 and mild-to-moderate
bilateral foraminal stenosis C3-C4.
2. Mild canal stenosis at C5-C6.

## 2021-05-17 ENCOUNTER — Other Ambulatory Visit: Payer: Self-pay

## 2021-05-17 ENCOUNTER — Ambulatory Visit
Admission: RE | Admit: 2021-05-17 | Discharge: 2021-05-17 | Disposition: A | Discharge status: Home / Self Care | Payer: Medicare Other | Source: Ambulatory Visit | Attending: Neurological Surgery | Admitting: Neurological Surgery

## 2021-05-17 DIAGNOSIS — Z981 Arthrodesis status: Secondary | ICD-10-CM

## 2021-05-17 IMAGING — CT CT L SPINE W/O CM
1 of 6 series · 6 of 14 positions shown, 8 images · non-contrast
Comparison: MRI lumbar spine [DATE]. CT lumbar spine
[DATE].

CLINICAL DATA: S/P lumbar fusion [N3] ([N3]-CM)

EXAM:
CT LUMBAR SPINE WITHOUT CONTRAST
TECHNIQUE: Multidetector CT imaging of the lumbar spine was performed without
intravenous contrast administration. Multiplanar CT image
reconstructions were also generated.

[Series 3: l spine soft (person_name) · axial · 0.34mm/px · z∈[-360,-184]mm · 6 of 124 slices shown, 8 images]
[im 18/124  soft-tissue]
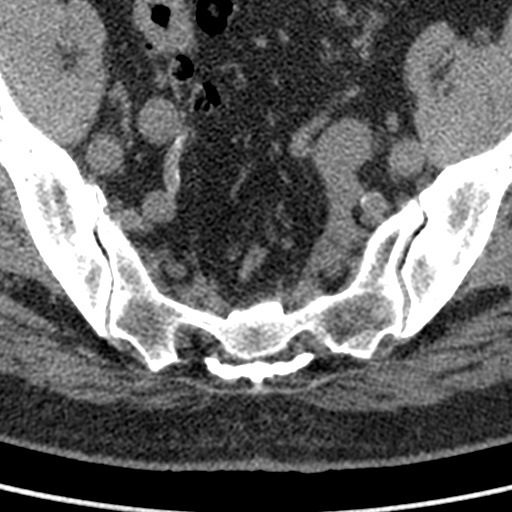
[im 18/124  bone]
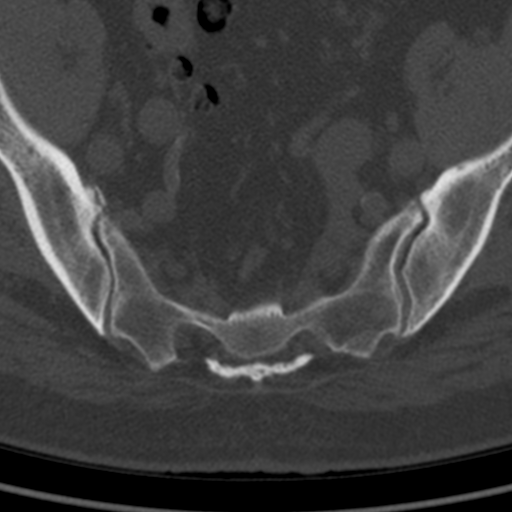
[im 36/124  bone]
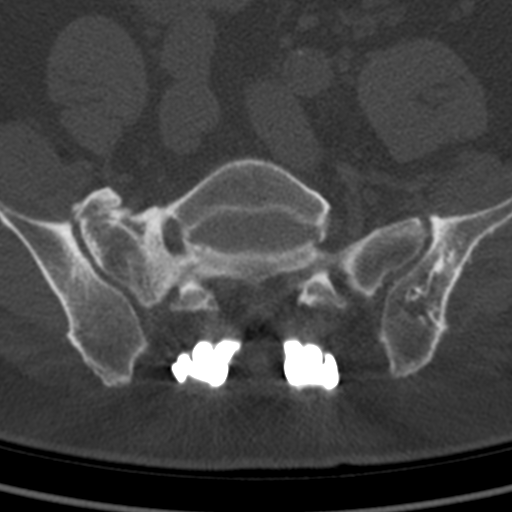
[im 53/124  bone]
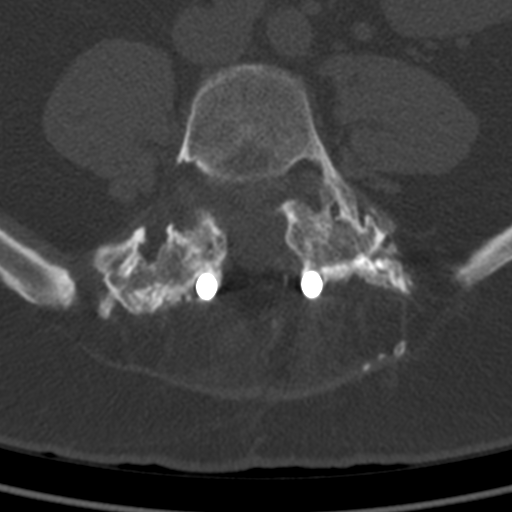
[im 71/124  bone]
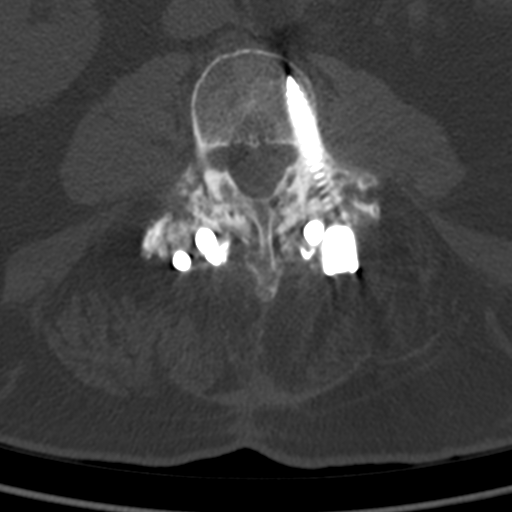
[im 88/124  soft-tissue]
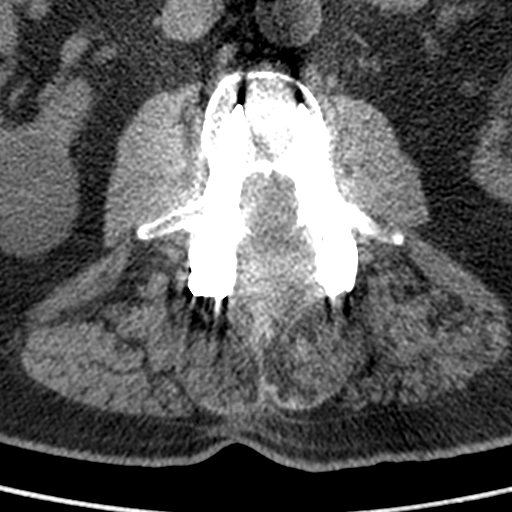
[im 88/124  bone]
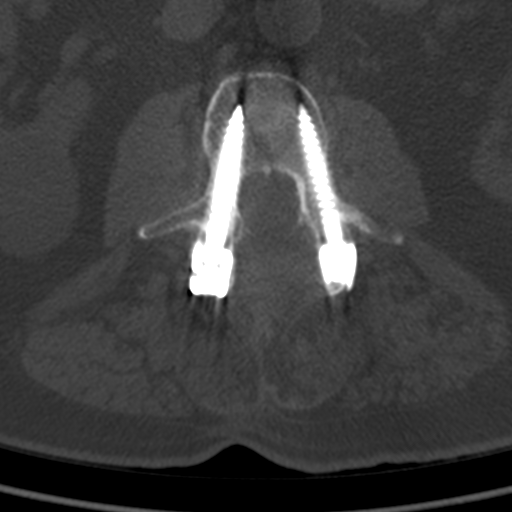
[im 106/124  bone]
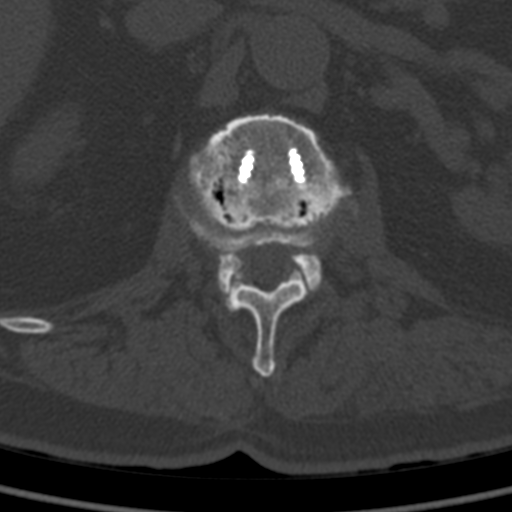

[6 of 14 positions shown; findings below may reference images not displayed]

FINDINGS: Segmentation: Transitional lumbosacral anatomy. Similar numbering
system is used with rudimentary ribs at the L1 level and partial
lumbarization of S1.

Alignment: Grade 2 anterolisthesis of L5 on S1. 5 mm of
retrolisthesis of L1 on L2. Slight retrolisthesis of L3 on L4.

Vertebrae: Posterior pedicle screw and rod fixation spanning L2-L4.
Additional rod and laminar hook fixation spanning from the L5
through the sacrum. Fracture of the right L4 screw, similar to prior
CT. No lucency around the screw with to suggest loosening. There is
evidence of osseous fusion across the L2-L3 disc space on the right
and some osseous fusion across the L5-S1 disc space anteriorly and
across the posterior elements at this level. Otherwise, no definite
osseous fusion at the other levels.

Paraspinal and other soft tissues: Aorto bi-iliac calcific
atherosclerosis. Postsurgical changes in the posterior paraspinal
soft tissues of the lumbar spine.

Disc levels:

T12-L1: Not imaged.

L1-L2: Only imaged sagittally. Severe degenerative disc disease with
posterior endplate spurring. 5 mm of retrolisthesis of L1 on L2.
Suspected mild canal stenosis and moderate bilateral foraminal
stenosis.

L2-L3: Posterior laminectomies and fusion with evidence of osseous
fusion across the disc space. Canal and foramina appear patent.

L3-L4: Posterior laminectomy infusion. Canal and foramina appear
patent.

L4-L5: Posterior laminectomy is with rods and hook fixation. Patent
canal. Severe/bulky facet arthropathy/hypertrophy with suspected
moderate foraminal stenosis.

L5-S1: Grade 2 anterolisthesis. Fusion of the posterior elements and
fusion across the anterior disc space. Posterior decompression. No
significant stenosis.

S1-S2: Transitional anatomy.  No significant stenosis.
IMPRESSION: 1. Transitional lumbosacral anatomy with transitional S1 vertebral
body, as on the prior MRI and CT. Correlation with radiographs is
recommended prior to any operative intervention.
2. Lumbosacral fusion hardware spanning from L2 through the sacrum
with patent canal at these levels, as detailed above. Right L4 screw
fracture, similar to prior CT.
3. Suspected moderate foraminal stenosis bilaterally at L1-L2 and
L4-L5 and mid canal stenosis at L1-L2, better characterized on
recent MRI.

## 2021-05-27 ENCOUNTER — Other Ambulatory Visit: Payer: Medicare Other

## 2021-06-10 ENCOUNTER — Ambulatory Visit: Payer: Medicare Other | Admitting: Dermatology

## 2021-07-07 ENCOUNTER — Other Ambulatory Visit: Payer: Self-pay

## 2021-07-07 ENCOUNTER — Ambulatory Visit: Payer: Medicare Other | Admitting: Dermatology

## 2021-07-07 DIAGNOSIS — C44519 Basal cell carcinoma of skin of other part of trunk: Secondary | ICD-10-CM | POA: Diagnosis not present

## 2021-07-07 DIAGNOSIS — L820 Inflamed seborrheic keratosis: Secondary | ICD-10-CM | POA: Diagnosis not present

## 2021-07-07 DIAGNOSIS — L578 Other skin changes due to chronic exposure to nonionizing radiation: Secondary | ICD-10-CM

## 2021-07-07 DIAGNOSIS — L570 Actinic keratosis: Secondary | ICD-10-CM | POA: Diagnosis not present

## 2021-07-07 DIAGNOSIS — Z85828 Personal history of other malignant neoplasm of skin: Secondary | ICD-10-CM

## 2021-07-07 DIAGNOSIS — D485 Neoplasm of uncertain behavior of skin: Secondary | ICD-10-CM

## 2021-07-07 NOTE — Progress Notes (Signed)
Follow-Up Visit   Subjective  Jimmy Rangel is a 75 y.o. male who presents for the following: Follow-up (Patient presents for 3 month follow-up Aks. He has a new spot on his chest that came up about 2 months ago with a history of bleeding. He has a history of multiple BCCs. MOHs surgery for [facility] of the right upper nasolabial treated July 2022.). He has growths on R jaw and cheek area that itch and he picks at.   The following portions of the chart were reviewed this encounter and updated as appropriate:       Review of Systems:  No other skin or systemic complaints except as noted in HPI or Assessment and Plan.  Objective  Well appearing patient in no apparent distress; mood and affect are within normal limits.  All skin waist up examined.  R upper nasolabial, R post auricular neck, see history Well healed scar with no evidence of recurrence.   L mid sternum 6.47mm pink crusted papule         R malar cheek x 2, L temple x 2, R upper eyebrow x 1, upper forehead x 7, glabella x 1, R post upper neck x 1, L central upper abd x 1 (15) Pink keratotic macules.  R jaw x 7, L forehead x 2 (9) Erythematous keratotic or waxy stuck-on papule     Assessment & Plan  Actinic Damage - Severe, confluent actinic changes with pre-cancerous actinic keratoses  - Severe, chronic, not at goal, secondary to cumulative UV radiation exposure over time - diffuse scaly erythematous macules and papules with underlying dyspigmentation - Discussed Prescription "Field Treatment" for Severe, Chronic Confluent Actinic Changes with Pre-Cancerous Actinic Keratoses Field treatment involves treatment of an entire area of skin that has confluent Actinic Changes (Sun/ Ultraviolet light damage) and PreCancerous Actinic Keratoses by method of PhotoDynamic Therapy (PDT) and/or prescription Topical Chemotherapy agents such as 5-fluorouracil, 5-fluorouracil/calcipotriene, and/or imiquimod.  The purpose is to  decrease the number of clinically evident and subclinical PreCancerous lesions to prevent progression to development of skin cancer by chemically destroying early precancer changes that may or may not be visible.  It has been shown to reduce the risk of developing skin cancer in the treated area. As a result of treatment, redness, scaling, crusting, and open sores may occur during treatment course. One or more than one of these methods may be used and may have to be used several times to control, suppress and eliminate the PreCancerous changes. Discussed treatment course, expected reaction, and possible side effects. - Recommend daily broad spectrum sunscreen SPF 30+ to sun-exposed areas, reapply every 2 hours as needed.  - Staying in the shade or wearing long sleeves, sun glasses (UVA+UVB protection) and wide brim hats (4-inch brim around the entire circumference of the hat) are also recommended. - Call for new or changing lesions. 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 Clear. Observe for recurrence. Call clinic for new or changing lesions.  Recommend regular skin exams, daily broad-spectrum spf 30+ sunscreen use, and photoprotection.    Neoplasm of uncertain behavior of skin L mid sternum  Skin / nail biopsy Type of biopsy: tangential   Informed consent: discussed and consent obtained   Patient was prepped and draped in usual sterile fashion: Area prepped with alcohol.  Anesthesia: the lesion was anesthetized in a standard fashion   Anesthetic:  1% lidocaine w/ epinephrine 1-100,000 buffered w/ 8.4% NaHCO3 Instrument used: flexible razor blade   Hemostasis achieved with: pressure, aluminum chloride and electrodesiccation   Outcome: patient tolerated procedure well    Destruction of lesion  Destruction method:  electrodesiccation and curettage   Informed consent: discussed and consent obtained   Timeout:  patient name, date of birth, surgical site, and procedure verified Curettage performed in three different directions: Yes   Electrodesiccation performed over the curetted area: Yes   Final wound size (cm):  0.9 Hemostasis achieved with:  pressure, aluminum chloride and electrodesiccation Outcome: patient tolerated procedure well with no complications   Post-procedure details: wound care instructions given   Post-procedure details comment:  Ointment and bandage applied.  Specimen 1 - Surgical pathology Differential Diagnosis: ISK r/o BCC Check Margins: No 6.67mm pink crusted papule EDC today  AK (actinic keratosis) (15) R malar cheek x 2, L temple x 2, R upper eyebrow x 1, upper forehead x 7, glabella x 1, R post upper neck x 1, L central upper abd x 1  Actinic keratoses are precancerous spots that appear secondary to cumulative UV radiation exposure/sun exposure over time. They are chronic with expected duration over 1 year. A portion of actinic keratoses will progress to squamous cell carcinoma of the skin. It is not possible to reliably predict which spots will progress to skin cancer and so treatment is recommended to prevent development of skin cancer.  Recommend daily broad spectrum sunscreen SPF 30+ to sun-exposed areas, reapply every 2 hours as needed.  Recommend staying in the shade or wearing long sleeves, sun glasses (UVA+UVB protection) and wide brim hats (4-inch brim around the entire circumference of the hat). Call for new or changing lesions.  Recheck right malar cheek on f/u. And Left central upper abdomen  Destruction of lesion - R malar cheek x 2, L temple x 2, R upper eyebrow x 1, upper forehead x 7, glabella x 1, R post upper neck x 1, L central upper abd x 1  Destruction method: cryotherapy   Informed consent: discussed and consent obtained   Lesion destroyed using liquid  nitrogen: Yes   Region frozen until ice ball extended beyond lesion: Yes   Outcome: patient tolerated procedure well with no complications   Post-procedure details: wound care instructions given   Additional details:  Prior to procedure, discussed risks of blister formation, small wound, skin dyspigmentation, or rare scar following cryotherapy. Recommend Vaseline ointment to treated areas while healing.   Inflamed seborrheic keratosis R jaw x 7, L forehead x 2  Destruction of lesion - R jaw x 7, L forehead x 2  Destruction method: cryotherapy   Informed consent: discussed and consent obtained   Lesion destroyed using liquid nitrogen: Yes   Region frozen until ice ball extended beyond lesion: Yes   Outcome: patient tolerated procedure well with no complications   Post-procedure details: wound care instructions given   Additional details:  Prior to procedure, discussed risks of blister formation, small wound, skin dyspigmentation, or rare scar following cryotherapy. Recommend Vaseline ointment to treated areas while healing.   Return in about 6 months (around 01/04/2022) for  UBSE, recheck R malar cheek. Will sched PDT to face x 2, 1 month apart this fall/winter.  Lindi Adie, CMA, am acting as scribe for [name], MD . Documentation: I have reviewed the above documentation for accuracy and completeness, and  I agree with the above.  [name] MD

## 2021-07-07 NOTE — Patient Instructions (Addendum)
Cryotherapy Aftercare  Wash gently with soap and water everyday.   Apply Vaseline and Band-Aid daily until healed.    Wound Care Instructions  Cleanse wound gently with soap and water once a day then pat dry with clean gauze. Apply a thing coat of Petrolatum (petroleum jelly, "Vaseline") over the wound (unless you have an allergy to this). We recommend that you use a new, sterile tube of Vaseline. Do not pick or remove scabs. Do not remove the yellow or white "healing tissue" from the base of the wound.  Cover the wound with fresh, clean, nonstick gauze and secure with paper tape. You may use Band-Aids in place of gauze and tape if the would is small enough, but would recommend trimming much of the tape off as there is often too much. Sometimes Band-Aids can irritate the skin.  You should call the office for your biopsy report after 1 week if you have not already been contacted.  If you experience any problems, such as abnormal amounts of bleeding, swelling, significant bruising, significant pain, or evidence of infection, please call the office immediately.  FOR ADULT SURGERY PATIENTS: If you need something for pain relief you may take 1 extra strength Tylenol (acetaminophen) AND 2 Ibuprofen (200mg each) together every 4 hours as needed for pain. (do not take these if you are allergic to them or if you have a reason you should not take them.) Typically, you may only need pain medication for 1 to 3 days.     If you have any questions or concerns for your doctor, please call our main line at [phone] and press option 4 to reach your doctor's medical assistant. If no one answers, please leave a voicemail as directed and we will return your call as soon as possible. Messages left after 4 pm will be answered the following business day.   You may also send us a message via MyChart. We typically respond to MyChart messages within 1-2 business days.  For prescription refills, please ask your  pharmacy to contact our office. Our fax number is [phone].  If you have an urgent issue when the clinic is closed that cannot wait until the next business day, you can page your doctor at the number below.    Please note that while we do our best to be available for urgent issues outside of office hours, we are not available 24/7.   If you have an urgent issue and are unable to reach us, you may choose to seek medical care at your doctor's office, retail clinic, urgent care center, or emergency room.  If you have a medical emergency, please immediately call 911 or go to the emergency department.  Pager Numbers  - Dr. Kowalski: [phone]  - Dr. Moye: [phone]  - Dr. Stewart: [phone]  In the event of inclement weather, please call our main line at [phone] for an update on the status of any delays or closures.  Dermatology Medication Tips: Please keep the boxes that topical medications come in in order to help keep track of the instructions about where and how to use these. Pharmacies typically print the medication instructions only on the boxes and not directly on the medication tubes.   If your medication is too expensive, please contact our office at [phone] option 4 or send us a message through MyChart.   We are unable to tell what your co-pay for medications will be in advance as this is different depending on your insurance coverage.   However, we may be able to find a substitute medication at lower cost or fill out paperwork to get insurance to cover a needed medication.   If a prior authorization is required to get your medication covered by your insurance company, please allow us 1-2 business days to complete this process.  Drug prices often vary depending on where the prescription is filled and some pharmacies may offer cheaper prices.  The website www.goodrx.com contains coupons for medications through different pharmacies. The prices here do not  account for what the cost may be with help from insurance (it may be cheaper with your insurance), but the website can give you the price if you did not use any insurance.  - You can print the associated coupon and take it with your prescription to the pharmacy.  - You may also stop by our office during regular business hours and pick up a GoodRx coupon card.  - If you need your prescription sent electronically to a different pharmacy, notify our office through [facility] MyChart or by phone at [phone] option 4.  

## 2021-07-10 ENCOUNTER — Other Ambulatory Visit: Payer: Self-pay | Admitting: Neurological Surgery

## 2021-07-10 ENCOUNTER — Telehealth: Payer: Self-pay

## 2021-07-10 NOTE — Telephone Encounter (Signed)
-----   Message from [name], MD sent at 07/10/2021  1:45 PM EDT ----- Skin , left mid sternum BASAL CELL CARCINOMA WITH FOCAL SCLEROSIS, CRUSTED  BCC skin cancer- already treated with EDC at time of biopsy   - please call patient

## 2021-07-10 NOTE — Telephone Encounter (Signed)
Advised pt of bx result/sh ?

## 2021-07-18 ENCOUNTER — Other Ambulatory Visit: Payer: Self-pay | Admitting: Neurological Surgery

## 2021-08-28 NOTE — Pre-Procedure Instructions (Signed)
Surgical Instructions    Your procedure is scheduled on Tuesday, November 22nd.  Report to [facility] Main Entrance "A" at 05:30 A.M., then check in with the Admitting office.  Call this number if you have problems the morning of surgery:  [phone]   If you have any questions prior to your surgery date call [phone]: Open Monday-Friday 8am-4pm    Remember:  Do not eat or drink after midnight the night before your surgery      Take these medicines the morning of surgery with A SIP OF WATER  omeprazole (PRILOSEC)  If needed: Carboxymethylcellulose Sodium (REFRESH TEARS OP) fluticasone (FLONASE)    As of today, STOP taking any Aspirin (unless otherwise instructed by your surgeon) Aleve, Naproxen, Ibuprofen, Motrin, Advil, Goody's, BC's, all herbal medications, fish oil, and all vitamins. This includes diclofenac (VOLTAREN).                     Do NOT Smoke (Tobacco/Vaping) or drink Alcohol 24 hours prior to your procedure.  If you use a CPAP at night, you may bring all equipment for your overnight stay.   Contacts, glasses, piercing's, hearing aid's, dentures or partials may not be worn into surgery, please bring cases for these belongings.    For patients admitted to the hospital, discharge time will be determined by your treatment team.   Patients discharged the day of surgery will not be allowed to drive home, and someone needs to stay with them for 24 hours.  NO VISITORS WILL BE ALLOWED IN PRE-OP WHERE PATIENTS GET READY FOR SURGERY.  ONLY 1 SUPPORT PERSON MAY BE PRESENT IN THE WAITING ROOM WHILE YOU ARE IN SURGERY.  IF YOU ARE TO BE ADMITTED, ONCE YOU ARE IN YOUR ROOM YOU WILL BE ALLOWED TWO (2) VISITORS.  Minor children may have two parents present. Special consideration for safety and communication needs will be reviewed on a case by case basis.   Special instructions:   [place]- Preparing For Surgery  Before surgery, you can play an important role. Because  skin is not sterile, your skin needs to be as free of germs as possible. You can reduce the number of germs on your skin by washing with CHG (chlorahexidine gluconate) Soap before surgery.  CHG is an antiseptic cleaner which kills germs and bonds with the skin to continue killing germs even after washing.    Oral Hygiene is also important to reduce your risk of infection.  Remember - BRUSH YOUR TEETH THE MORNING OF SURGERY WITH YOUR REGULAR TOOTHPASTE  Please do not use if you have an allergy to CHG or antibacterial soaps. If your skin becomes reddened/irritated stop using the CHG.  Do not shave (including legs and underarms) for at least 48 hours prior to first CHG shower. It is OK to shave your face.  Please follow these instructions carefully.   Shower the NIGHT BEFORE SURGERY and the MORNING OF SURGERY  If you chose to wash your hair, wash your hair first as usual with your normal shampoo.  After you shampoo, rinse your hair and body thoroughly to remove the shampoo.  Use CHG Soap as you would any other liquid soap. You can apply CHG directly to the skin and wash gently with a scrungie or a clean washcloth.   Apply the CHG Soap to your body ONLY FROM THE NECK DOWN.  Do not use on open wounds or open sores. Avoid contact with your eyes, ears, mouth and genitals (  private parts). Wash Face and genitals (private parts)  with your normal soap.   Wash thoroughly, paying special attention to the area where your surgery will be performed.  Thoroughly rinse your body with warm water from the neck down.  DO NOT shower/wash with your normal soap after using and rinsing off the CHG Soap.  Pat yourself dry with a CLEAN TOWEL.  Wear CLEAN PAJAMAS to bed the night before surgery  Place CLEAN SHEETS on your bed the night before your surgery  DO NOT SLEEP WITH PETS.   Day of Surgery: Shower with CHG soap. Do not wear jewelry Do not wear lotions, powders, colognes, or deodorant. Men may shave  face and neck. Do not bring valuables to the hospital. [facility] is not responsible for any belongings or valuables. Wear Clean/Comfortable clothing the morning of surgery Remember to brush your teeth WITH YOUR REGULAR TOOTHPASTE.   Please read over the following fact sheets that you were given.   3 days prior to your procedure or After your COVID test   You are not required to quarantine however you are required to wear a well-fitting mask when you are out and around people not in your household. If your mask becomes wet or soiled, replace with a new one.   Wash your hands often with soap and water for 20 seconds or clean your hands with an alcohol-based hand sanitizer that contains at least 60% alcohol.   Do not share personal items.   Notify your provider:  o if you are in close contact with someone who has COVID  o or if you develop a fever of 100.4 or greater, sneezing, cough, sore throat, shortness of breath or body aches.

## 2021-08-29 ENCOUNTER — Other Ambulatory Visit: Payer: Self-pay

## 2021-08-29 ENCOUNTER — Encounter (HOSPITAL_COMMUNITY)
Admission: RE | Admit: 2021-08-29 | Discharge: 2021-08-29 | Disposition: A | Discharge status: Home / Self Care | Payer: Medicare Other | Source: Ambulatory Visit | Attending: Neurological Surgery | Admitting: Neurological Surgery

## 2021-08-29 ENCOUNTER — Encounter (HOSPITAL_COMMUNITY): Payer: Self-pay

## 2021-08-29 VITALS — BP 139/86 | HR 73 | Temp 97.8°F | Resp 17 | Ht 71.0 in | Wt 204.2 lb

## 2021-08-29 DIAGNOSIS — E119 Type 2 diabetes mellitus without complications: Secondary | ICD-10-CM | POA: Insufficient documentation

## 2021-08-29 DIAGNOSIS — Z9989 Dependence on other enabling machines and devices: Secondary | ICD-10-CM | POA: Diagnosis not present

## 2021-08-29 DIAGNOSIS — N400 Enlarged prostate without lower urinary tract symptoms: Secondary | ICD-10-CM | POA: Insufficient documentation

## 2021-08-29 DIAGNOSIS — Z01818 Encounter for other preprocedural examination: Secondary | ICD-10-CM | POA: Insufficient documentation

## 2021-08-29 DIAGNOSIS — R011 Cardiac murmur, unspecified: Secondary | ICD-10-CM | POA: Diagnosis not present

## 2021-08-29 DIAGNOSIS — Z20822 Contact with and (suspected) exposure to covid-19: Secondary | ICD-10-CM | POA: Insufficient documentation

## 2021-08-29 DIAGNOSIS — I358 Other nonrheumatic aortic valve disorders: Secondary | ICD-10-CM | POA: Diagnosis not present

## 2021-08-29 DIAGNOSIS — G4733 Obstructive sleep apnea (adult) (pediatric): Secondary | ICD-10-CM | POA: Diagnosis not present

## 2021-08-29 LAB — SURGICAL PCR SCREEN
MRSA, PCR: POSITIVE — AB
Staphylococcus aureus: POSITIVE — AB

## 2021-08-29 LAB — TYPE AND SCREEN
ABO/RH(D): A NEG
Antibody Screen: NEGATIVE

## 2021-08-29 NOTE — Progress Notes (Signed)
Positive PCR reported to on-call provider for Dr. Reatha Armour- [place] Brass

## 2021-08-29 NOTE — Progress Notes (Signed)
PCP - Dr. Anastasia Pall Cardiologist - denies  PPM/ICD - denies   Chest x-ray - Pt states he had one many years ago and it was normal EKG - 06/11/21- tracing requested Stress Test - 09/16/18 ECHO - 09/16/18 Cardiac Cath - denies  Sleep Study - pt states he had a sleep study 25+ years ago, was diagnosed with OSA CPAP - yes  DM- denies  Blood Thinner Instructions: n/a Aspirin Instructions: n/a  ERAS Protcol - no, NPO   COVID TEST- 08/29/21 at PAT   Anesthesia review: yes, cardiac hx (heart murmur)- spoke with Jeneen Rinks about this pt  Patient denies shortness of breath, fever, cough and chest pain at PAT appointment   All instructions explained to the patient, with a verbal understanding of the material. Patient agrees to go over the instructions while at home for a better understanding. Patient also instructed to wear a mask in public after being tested for COVID-19. The opportunity to ask questions was provided.

## 2021-08-30 LAB — SARS CORONAVIRUS 2 (TAT 6-24 HRS): SARS Coronavirus 2: NEGATIVE

## 2021-09-01 NOTE — Anesthesia Preprocedure Evaluation (Addendum)
Anesthesia Evaluation  Patient identified by MRN, date of birth, ID band Patient awake    Reviewed: Allergy & Precautions, NPO status , Patient's Chart, lab work & pertinent test results  Airway Mallampati: III  TM Distance: >3 FB Neck ROM: Full    Dental no notable dental hx.    Pulmonary sleep apnea and Continuous Positive Airway Pressure Ventilation ,    Pulmonary exam normal breath sounds clear to auscultation       Cardiovascular negative cardio ROS Normal cardiovascular exam Rhythm:Regular Rate:Normal     Neuro/Psych  Headaches, negative psych ROS   GI/Hepatic Neg liver ROS, GERD  Medicated and Controlled,  Endo/Other  negative endocrine ROS  Renal/GU negative Renal ROS     Musculoskeletal  (+) Arthritis ,   Abdominal   Peds  Hematology HLD   Anesthesia Other Findings PSEUDOARTHROSIS OF LUMBAR SPINE  Reproductive/Obstetrics                            Anesthesia Physical Anesthesia Plan  ASA: 2  Anesthesia Plan: General   Post-op Pain Management:    Induction: Intravenous  PONV Risk Score and Plan: 2 and Ondansetron, Dexamethasone, Treatment may vary due to age or medical condition and Midazolam  Airway Management Planned: Oral ETT  Additional Equipment: Arterial line  Intra-op Plan:   Post-operative Plan: Extubation in OR  Informed Consent: I have reviewed the patients History and Physical, chart, labs and discussed the procedure including the risks, benefits and alternatives for the proposed anesthesia with the patient or authorized representative who has indicated his/her understanding and acceptance.     Dental advisory given  Plan Discussed with: CRNA  Anesthesia Plan Comments: (PAT note by Karoline Caldwell, PA-C: Patient seen by cardiologist Dr. Geraldo Pitter in 2019 for evaluation of murmur as well as preoperative clearance.  Echo and nuclear stress were ordered.  Echo  showed EF 60 to 65%, normal wall motion, grade 1 DD, moderately thickened and calcified aortic valve without regurgitation or stenosis.  Nuclear stress was low risk, nonischemic.  He was cleared for surgery at that time and advised to follow-up with cardiology on an as-needed basis.  Patient seen by PCP Dr. Anastasia Pall on 08/19/2021 for preop evaluation.  Per note, "Pt will have labs today. He had a recent normal EKG, no symptoms, and a negative stress test in 2019. He has been doing well. I feel comfortable clearing pt to be able have surgery. Because he has had trouble related to BPH and a catheter after previous surgeries I am going to start flomax daily to hopefully help with preventing this."  OSA on CPAP.  Although it is not listed in history, review of records in [place] shows he has diet controlled DM2, last A1c 7.1 on 03/05/2021.  CMP and CBC 08/19/2021 in Care Everywhere reviewed, glucose mildly elevated 135, otherwise WNL.  EKG 06/11/2021 (Care Everywhere, tracing requested): Sinus rhythm.  Right bundle much block.  Echo 09/16/18: Study Conclusions Left ventricle: The cavity size was normal. Wall thickness was normal. Systolic function was normal. The estimated ejection fraction was in the range of 60% to 65%. Wall motion was normal; there were no regional wall motion abnormalities. Doppler parameters are consistent with abnormal left ventricular relaxation (grade 1 diastolic dysfunction). Aortic valve: Trileaflet; moderately thickened, moderately calcified leaflets. Valve mobility was restricted. Doppler: Transvalvular velocity was within the normal range.There was no stenosis. There was no regurgitation. Mean gradient (S):  9 mm Hg.  Peak gradient (S): 16 mm Hg. Aorta: Aortic root dimension: 40 mm (ED). Ascending aorta: The ascending aorta was mildly dilated. Right ventricle: The cavity size was mildly dilated. Wall thickness was normal. Right atrium: The atrium was  mildly dilated.  Nuclear stress test 09/16/18: Nuclear stress EF: 59%. There was no ST segment deviation noted during stress. The study is normal. This is a low risk study. The left ventricular ejection fraction is normal (55-65%). Normal pharmacologic nuclear stress test with no evidence for prior infarct or ischemia.  )      Anesthesia Quick Evaluation

## 2021-09-01 NOTE — Progress Notes (Signed)
Anesthesia Chart Review:  Patient seen by cardiologist Dr. Geraldo Pitter in 2019 for evaluation of murmur as well as preoperative clearance.  Echo and nuclear stress were ordered.  Echo showed EF 60 to 65%, normal wall motion, grade 1 DD, moderately thickened and calcified aortic valve without regurgitation or stenosis.  Nuclear stress was low risk, nonischemic.  He was cleared for surgery at that time and advised to follow-up with cardiology on an as-needed basis.  Patient seen by PCP Dr. Anastasia Pall on 08/19/2021 for preop evaluation.  Per note, "Pt will have labs today. He had a recent normal EKG, no symptoms, and a negative stress test in 2019. He has been doing well. I feel comfortable clearing pt to be able have surgery. Because he has had trouble related to BPH and a catheter after previous surgeries I am going to start flomax daily to hopefully help with preventing this."  OSA on CPAP.  Although it is not listed in history, review of records in [place] shows he has diet controlled DM2, last A1c 7.1 on 03/05/2021.  CMP and CBC 08/19/2021 in Care Everywhere reviewed, glucose mildly elevated 135, otherwise WNL.  EKG 06/11/2021 (Care Everywhere, tracing requested): Sinus rhythm.  Right bundle much block.  Echo 09/16/18: Study Conclusions  - Left ventricle: The cavity size was normal. Wall thickness was   normal. Systolic function was normal. The estimated ejection   fraction was in the range of 60% to 65%. Wall motion was normal;   there were no regional wall motion abnormalities. Doppler   parameters are consistent with abnormal left ventricular   relaxation (grade 1 diastolic dysfunction). - Aortic valve: Trileaflet; moderately thickened, moderately   calcified leaflets. Valve mobility was restricted. Doppler:   Transvalvular velocity was within the normal range.There was no   stenosis. There was no regurgitation.  Mean gradient (S): 9 mm Hg.    Peak gradient (S): 16 mm Hg. - Aorta:  Aortic root dimension: 40 mm (ED). - Ascending aorta: The ascending aorta was mildly dilated. - Right ventricle: The cavity size was mildly dilated. Wall   thickness was normal. - Right atrium: The atrium was mildly dilated.   Nuclear stress test 09/16/18: Nuclear stress EF: 59%. There was no ST segment deviation noted during stress. The study is normal. This is a low risk study. The left ventricular ejection fraction is normal (55-65%). Normal pharmacologic nuclear stress test with no evidence for prior infarct or ischemia.   Wynonia Musty [facility] Short Stay Center/Anesthesiology Phone [phone] 09/01/2021 9:40 AM

## 2021-09-02 ENCOUNTER — Other Ambulatory Visit: Payer: Self-pay

## 2021-09-02 ENCOUNTER — Inpatient Hospital Stay (HOSPITAL_COMMUNITY)
Admission: RE | Admit: 2021-09-02 | Discharge: 2021-09-07 | DRG: 454 | Disposition: A | Discharge status: Home / Self Care | Payer: Medicare Other | Source: Ambulatory Visit | Attending: Neurological Surgery | Admitting: Neurological Surgery

## 2021-09-02 ENCOUNTER — Inpatient Hospital Stay (HOSPITAL_COMMUNITY): Payer: Medicare Other

## 2021-09-02 ENCOUNTER — Inpatient Hospital Stay (HOSPITAL_COMMUNITY): Payer: Medicare Other | Admitting: Physician Assistant

## 2021-09-02 ENCOUNTER — Encounter (HOSPITAL_COMMUNITY): Payer: Self-pay | Admitting: Neurological Surgery

## 2021-09-02 ENCOUNTER — Inpatient Hospital Stay (HOSPITAL_COMMUNITY): Payer: Medicare Other | Admitting: Anesthesiology

## 2021-09-02 ENCOUNTER — Inpatient Hospital Stay (HOSPITAL_COMMUNITY)
Admission: RE | Disposition: A | Discharge status: Home / Self Care | Payer: Self-pay | Source: Ambulatory Visit | Attending: Neurological Surgery

## 2021-09-02 DIAGNOSIS — M960 Pseudarthrosis after fusion or arthrodesis: Secondary | ICD-10-CM

## 2021-09-02 DIAGNOSIS — K219 Gastro-esophageal reflux disease without esophagitis: Secondary | ICD-10-CM | POA: Diagnosis present

## 2021-09-02 DIAGNOSIS — Z888 Allergy status to other drugs, medicaments and biological substances status: Secondary | ICD-10-CM | POA: Diagnosis not present

## 2021-09-02 DIAGNOSIS — Z79899 Other long term (current) drug therapy: Secondary | ICD-10-CM

## 2021-09-02 DIAGNOSIS — M48061 Spinal stenosis, lumbar region without neurogenic claudication: Secondary | ICD-10-CM | POA: Diagnosis present

## 2021-09-02 DIAGNOSIS — Z96651 Presence of right artificial knee joint: Secondary | ICD-10-CM | POA: Diagnosis present

## 2021-09-02 DIAGNOSIS — Z419 Encounter for procedure for purposes other than remedying health state, unspecified: Secondary | ICD-10-CM

## 2021-09-02 DIAGNOSIS — G8929 Other chronic pain: Secondary | ICD-10-CM | POA: Diagnosis present

## 2021-09-02 DIAGNOSIS — G4730 Sleep apnea, unspecified: Secondary | ICD-10-CM | POA: Diagnosis present

## 2021-09-02 DIAGNOSIS — E785 Hyperlipidemia, unspecified: Secondary | ICD-10-CM | POA: Diagnosis present

## 2021-09-02 DIAGNOSIS — Y831 Surgical operation with implant of artificial internal device as the cause of abnormal reaction of the patient, or of later complication, without mention of misadventure at the time of the procedure: Secondary | ICD-10-CM | POA: Diagnosis present

## 2021-09-02 DIAGNOSIS — M4726 Other spondylosis with radiculopathy, lumbar region: Secondary | ICD-10-CM | POA: Diagnosis present

## 2021-09-02 DIAGNOSIS — Z880 Allergy status to penicillin: Secondary | ICD-10-CM

## 2021-09-02 DIAGNOSIS — Z85828 Personal history of other malignant neoplasm of skin: Secondary | ICD-10-CM

## 2021-09-02 DIAGNOSIS — Z981 Arthrodesis status: Secondary | ICD-10-CM | POA: Diagnosis not present

## 2021-09-02 DIAGNOSIS — M5116 Intervertebral disc disorders with radiculopathy, lumbar region: Principal | ICD-10-CM | POA: Diagnosis present

## 2021-09-02 DIAGNOSIS — T84018A Broken internal joint prosthesis, other site, initial encounter: Secondary | ICD-10-CM | POA: Diagnosis present

## 2021-09-02 HISTORY — PX: ANTERIOR LAT LUMBAR FUSION: SHX1168

## 2021-09-02 IMAGING — RF DG LUMBAR SPINE 2-3V
1 series · 3 of 3 positions shown · non-contrast
Comparison: CT [DATE]

CLINICAL DATA: Spine surgery

EXAM:
LUMBAR SPINE - 2-3 VIEW

[Series 1: run · 3 of 3 slices shown]
[im 1/3]
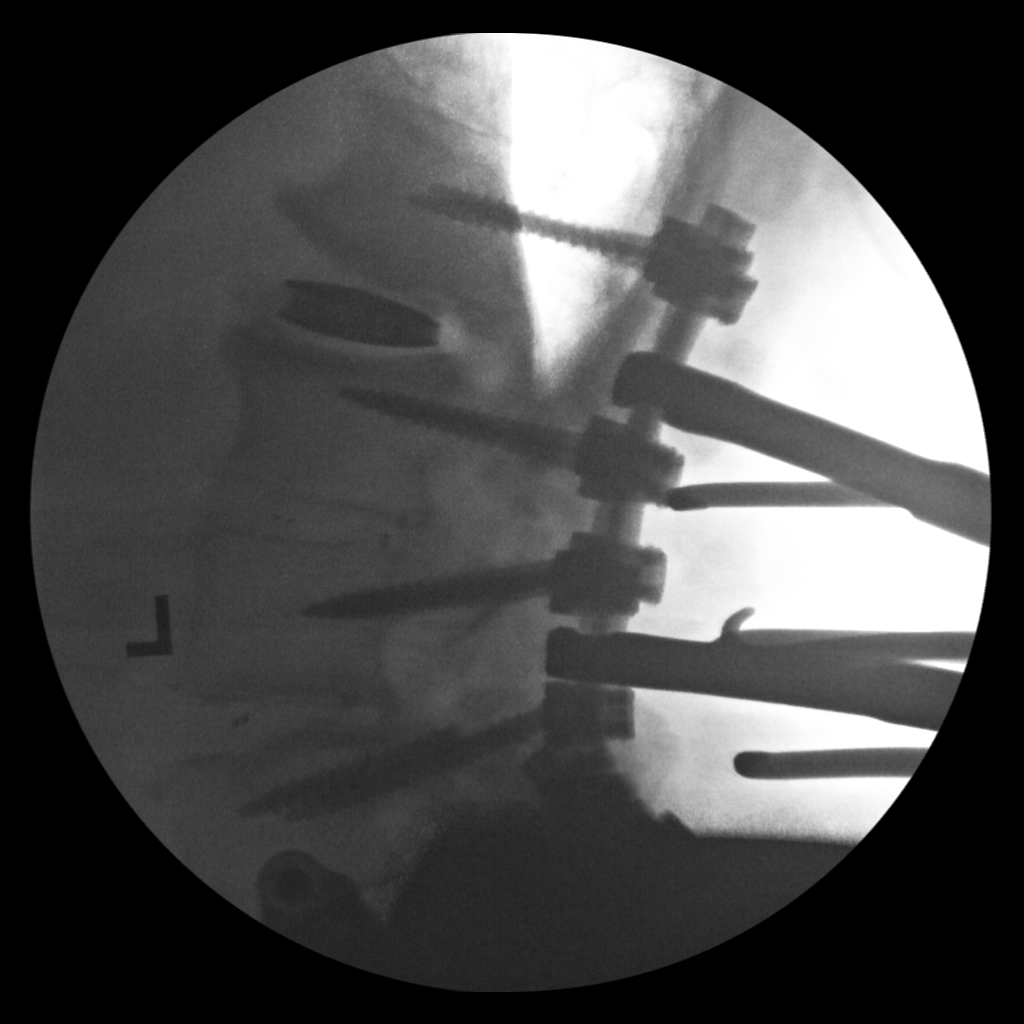
[im 2/3]
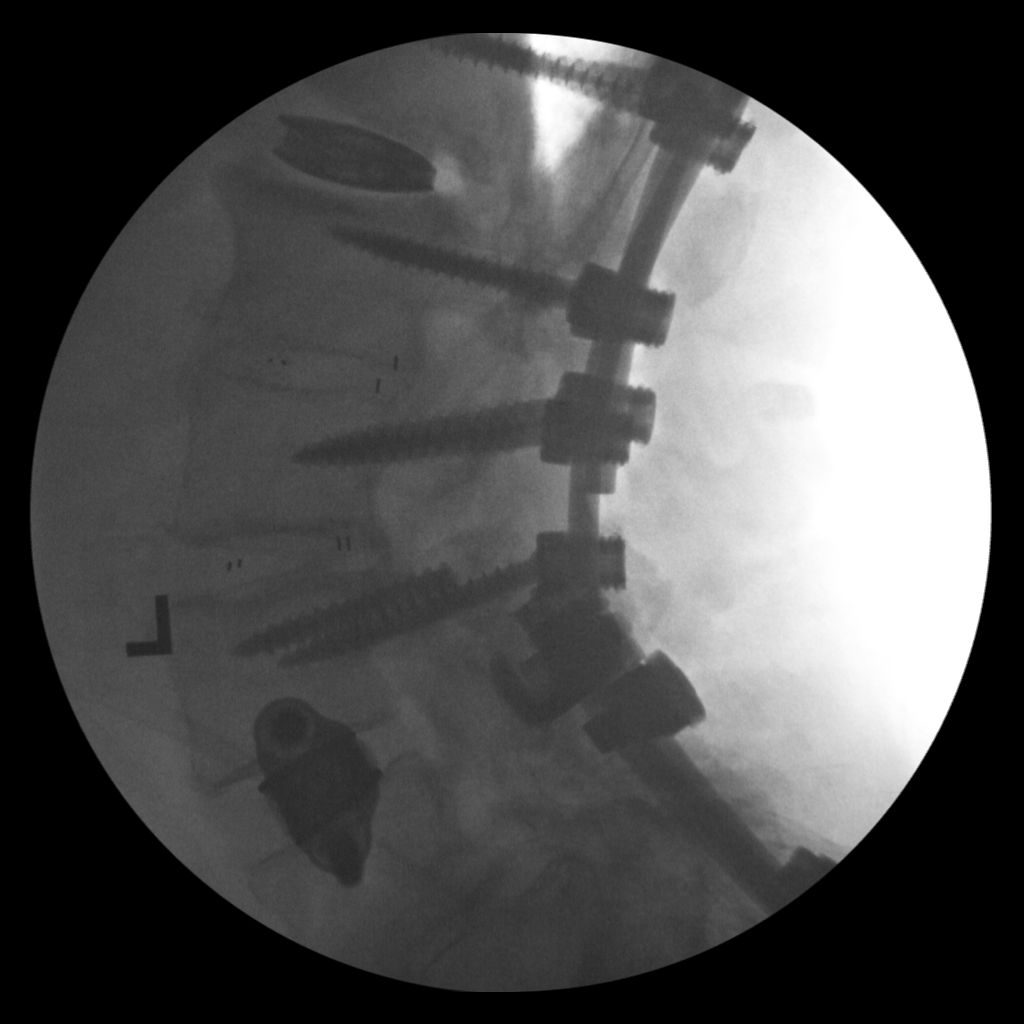
[im 3/3]
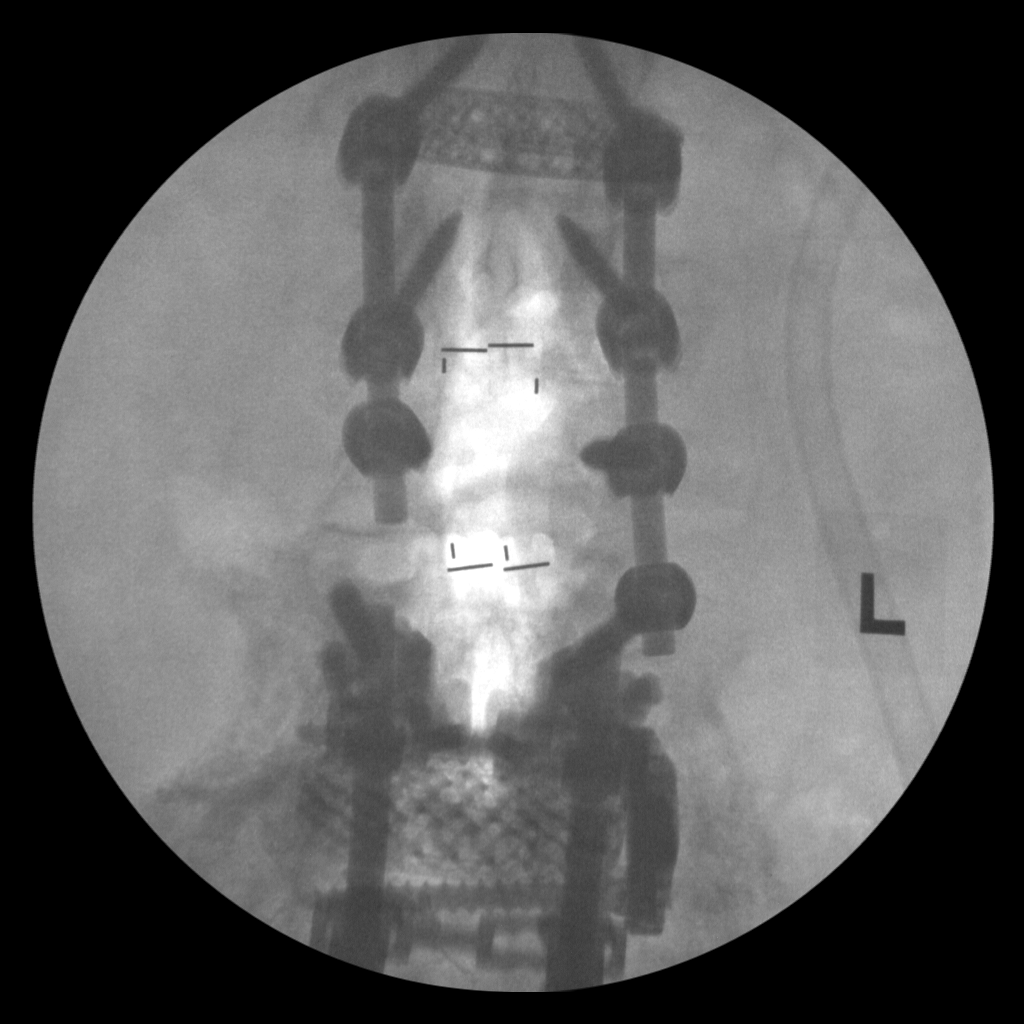

[3 of 3 positions shown; findings below may reference images not displayed]

FINDINGS: Three low resolution intraoperative spot views of the lumbar spine.
Total fluoroscopy time was 5 minutes 1 second. Limited localization
secondary to small field of view. Images were obtained during
operative course of hardware revision and placement of left lateral
plate, fixating screws, and interbody device at the lower lumbar
spine.
IMPRESSION: Intraoperative fluoroscopic assistance provided during lumbar spine
surgery.

## 2021-09-02 SURGERY — ANTERIOR LATERAL LUMBAR FUSION 2 LEVELS
Anesthesia: General | Site: Spine Lumbar

## 2021-09-02 MED ORDER — THROMBIN 20000 UNITS EX SOLR: CUTANEOUS | Status: DC | PRN

## 2021-09-02 MED ORDER — KETAMINE HCL 10 MG/ML IJ SOLN
INTRAMUSCULAR | Status: DC | PRN
  Administered 2021-09-02: 50 mg via INTRAVENOUS

## 2021-09-02 MED ORDER — ALBUMIN HUMAN 5 % IV SOLN: INTRAVENOUS | Status: DC | PRN

## 2021-09-02 MED ORDER — DEXMEDETOMIDINE (PRECEDEX) IN NS 20 MCG/5ML (4 MCG/ML) IV SYRINGE
PREFILLED_SYRINGE | INTRAVENOUS | Status: DC | PRN
  Administered 2021-09-02: 4 ug via INTRAVENOUS
  Administered 2021-09-02: 8 ug via INTRAVENOUS
  Administered 2021-09-02: 12 ug via INTRAVENOUS
  Administered 2021-09-02 (×2): 4 ug via INTRAVENOUS
  Administered 2021-09-02: 8 ug via INTRAVENOUS

## 2021-09-02 MED ORDER — LACTATED RINGERS IV SOLN: INTRAVENOUS | Status: DC | PRN

## 2021-09-02 MED ORDER — DEXMEDETOMIDINE (PRECEDEX) IN NS 20 MCG/5ML (4 MCG/ML) IV SYRINGE
PREFILLED_SYRINGE | INTRAVENOUS | Status: AC
  Filled 2021-09-02: qty 5

## 2021-09-02 MED ORDER — FENTANYL CITRATE (PF) 100 MCG/2ML IJ SOLN
INTRAMUSCULAR | Status: AC
  Filled 2021-09-02: qty 2

## 2021-09-02 MED ORDER — ROCURONIUM BROMIDE 10 MG/ML (PF) SYRINGE
PREFILLED_SYRINGE | INTRAVENOUS | Status: DC | PRN
  Administered 2021-09-02: 50 mg via INTRAVENOUS
  Administered 2021-09-02: 20 mg via INTRAVENOUS
  Administered 2021-09-02 (×2): 30 mg via INTRAVENOUS

## 2021-09-02 MED ORDER — ACETAMINOPHEN 650 MG RE SUPP: 650.0000 mg | RECTAL | Status: DC | PRN

## 2021-09-02 MED ORDER — DEXAMETHASONE SODIUM PHOSPHATE 10 MG/ML IJ SOLN
INTRAMUSCULAR | Status: AC
  Filled 2021-09-02: qty 1

## 2021-09-02 MED ORDER — BUPIVACAINE LIPOSOME 1.3 % IJ SUSP
INTRAMUSCULAR | Status: DC | PRN
  Administered 2021-09-02: 20 mL

## 2021-09-02 MED ORDER — ACETAMINOPHEN 500 MG PO TABS
1000.0000 mg | ORAL_TABLET | Freq: Once | ORAL | Status: AC
  Administered 2021-09-02: 1000 mg via ORAL
  Filled 2021-09-02: qty 2

## 2021-09-02 MED ORDER — PHENYLEPHRINE 40 MCG/ML (10ML) SYRINGE FOR IV PUSH (FOR BLOOD PRESSURE SUPPORT)
PREFILLED_SYRINGE | INTRAVENOUS | Status: AC
  Filled 2021-09-02: qty 20

## 2021-09-02 MED ORDER — VANCOMYCIN HCL 1000 MG/200ML IV SOLN
1000.0000 mg | Freq: Two times a day (BID) | INTRAVENOUS | Status: DC
Start: 2021-09-02 — End: 2021-09-03
  Administered 2021-09-02 – 2021-09-03 (×2): 1000 mg via INTRAVENOUS
  Filled 2021-09-02 (×3): qty 200

## 2021-09-02 MED ORDER — PROPOFOL 10 MG/ML IV BOLUS
INTRAVENOUS | Status: AC
  Filled 2021-09-02: qty 20

## 2021-09-02 MED ORDER — BUPIVACAINE LIPOSOME 1.3 % IJ SUSP
INTRAMUSCULAR | Status: AC
  Filled 2021-09-02: qty 20

## 2021-09-02 MED ORDER — THROMBIN 5000 UNITS EX SOLR
CUTANEOUS | Status: AC
  Filled 2021-09-02: qty 10000

## 2021-09-02 MED ORDER — OXYCODONE HCL 5 MG PO TABS
10.0000 mg | ORAL_TABLET | ORAL | Status: DC | PRN
  Administered 2021-09-02 – 2021-09-07 (×24): 10 mg via ORAL
  Filled 2021-09-02 (×23): qty 2

## 2021-09-02 MED ORDER — ESMOLOL HCL 100 MG/10ML IV SOLN
INTRAVENOUS | Status: AC
  Filled 2021-09-02: qty 10

## 2021-09-02 MED ORDER — EPHEDRINE 5 MG/ML INJ
INTRAVENOUS | Status: AC
  Filled 2021-09-02: qty 5

## 2021-09-02 MED ORDER — SUGAMMADEX SODIUM 500 MG/5ML IV SOLN
INTRAVENOUS | Status: AC
  Filled 2021-09-02: qty 5

## 2021-09-02 MED ORDER — PROPOFOL 10 MG/ML IV BOLUS
INTRAVENOUS | Status: DC | PRN
  Administered 2021-09-02: 20 mg via INTRAVENOUS
  Administered 2021-09-02: 180 mg via INTRAVENOUS

## 2021-09-02 MED ORDER — PHENOL 1.4 % MT LIQD: 1.0000 | OROMUCOSAL | Status: DC | PRN

## 2021-09-02 MED ORDER — ACETAMINOPHEN 325 MG PO TABS
650.0000 mg | ORAL_TABLET | ORAL | Status: DC | PRN
  Administered 2021-09-06 – 2021-09-07 (×2): 650 mg via ORAL
  Filled 2021-09-02 (×2): qty 2

## 2021-09-02 MED ORDER — ONDANSETRON HCL 4 MG/2ML IJ SOLN
INTRAMUSCULAR | Status: AC
  Filled 2021-09-02: qty 2

## 2021-09-02 MED ORDER — ONDANSETRON HCL 4 MG/2ML IJ SOLN: 4.0000 mg | Freq: Once | INTRAMUSCULAR | Status: DC | PRN

## 2021-09-02 MED ORDER — SUGAMMADEX SODIUM 500 MG/5ML IV SOLN
INTRAVENOUS | Status: DC | PRN
  Administered 2021-09-02: 400 mg via INTRAVENOUS

## 2021-09-02 MED ORDER — 0.9 % SODIUM CHLORIDE (POUR BTL) OPTIME
TOPICAL | Status: DC | PRN
  Administered 2021-09-02: 2000 mL

## 2021-09-02 MED ORDER — VANCOMYCIN HCL 1000 MG IV SOLR
INTRAVENOUS | Status: AC
  Filled 2021-09-02: qty 20

## 2021-09-02 MED ORDER — PANTOPRAZOLE SODIUM 40 MG PO TBEC
40.0000 mg | DELAYED_RELEASE_TABLET | Freq: Every day | ORAL | Status: DC
  Administered 2021-09-03 – 2021-09-07 (×5): 40 mg via ORAL
  Filled 2021-09-02 (×5): qty 1

## 2021-09-02 MED ORDER — CHLORHEXIDINE GLUCONATE CLOTH 2 % EX PADS: 6.0000 | MEDICATED_PAD | Freq: Once | CUTANEOUS | Status: DC

## 2021-09-02 MED ORDER — FENTANYL CITRATE (PF) 250 MCG/5ML IJ SOLN
INTRAMUSCULAR | Status: AC
  Filled 2021-09-02: qty 5

## 2021-09-02 MED ORDER — DEXAMETHASONE SODIUM PHOSPHATE 4 MG/ML IJ SOLN
INTRAMUSCULAR | Status: DC | PRN
  Administered 2021-09-02: 4 mg via INTRAVENOUS

## 2021-09-02 MED ORDER — FLUTICASONE PROPIONATE 50 MCG/ACT NA SUSP
2.0000 | Freq: Every day | NASAL | Status: DC | PRN
  Filled 2021-09-02: qty 16

## 2021-09-02 MED ORDER — CHLORHEXIDINE GLUCONATE 0.12 % MT SOLN
15.0000 mL | Freq: Once | OROMUCOSAL | Status: AC
  Administered 2021-09-02: 15 mL via OROMUCOSAL
  Filled 2021-09-02: qty 15

## 2021-09-02 MED ORDER — HYDROCODONE-ACETAMINOPHEN 10-325 MG PO TABS
1.0000 | ORAL_TABLET | ORAL | Status: DC | PRN
  Administered 2021-09-05 – 2021-09-06 (×4): 1 via ORAL
  Filled 2021-09-02 (×4): qty 1

## 2021-09-02 MED ORDER — LACTATED RINGERS IV SOLN: INTRAVENOUS | Status: DC

## 2021-09-02 MED ORDER — HYDROMORPHONE HCL 1 MG/ML IJ SOLN
0.5000 mg | INTRAMUSCULAR | Status: DC | PRN
  Administered 2021-09-02 – 2021-09-06 (×8): 0.5 mg via INTRAVENOUS
  Filled 2021-09-02 (×7): qty 0.5

## 2021-09-02 MED ORDER — LIDOCAINE 2% (20 MG/ML) 5 ML SYRINGE
INTRAMUSCULAR | Status: DC | PRN
  Administered 2021-09-02: 100 mg via INTRAVENOUS

## 2021-09-02 MED ORDER — SODIUM CHLORIDE 0.9% FLUSH
3.0000 mL | Freq: Two times a day (BID) | INTRAVENOUS | Status: DC
  Administered 2021-09-02 – 2021-09-07 (×8): 3 mL via INTRAVENOUS

## 2021-09-02 MED ORDER — ROCURONIUM BROMIDE 10 MG/ML (PF) SYRINGE
PREFILLED_SYRINGE | INTRAVENOUS | Status: AC
  Filled 2021-09-02: qty 10

## 2021-09-02 MED ORDER — SODIUM CHLORIDE 0.9 % IV SOLN
250.0000 mL | INTRAVENOUS | Status: DC
  Administered 2021-09-04: 250 mL via INTRAVENOUS

## 2021-09-02 MED ORDER — FENTANYL CITRATE (PF) 100 MCG/2ML IJ SOLN
INTRAMUSCULAR | Status: DC | PRN
  Administered 2021-09-02 (×2): 50 ug via INTRAVENOUS
  Administered 2021-09-02: 100 ug via INTRAVENOUS
  Administered 2021-09-02: 50 ug via INTRAVENOUS
  Administered 2021-09-02: 100 ug via INTRAVENOUS
  Administered 2021-09-02: 25 ug via INTRAVENOUS
  Administered 2021-09-02: 50 ug via INTRAVENOUS
  Administered 2021-09-02: 75 ug via INTRAVENOUS

## 2021-09-02 MED ORDER — HYDROMORPHONE HCL 1 MG/ML IJ SOLN
INTRAMUSCULAR | Status: AC
  Filled 2021-09-02: qty 1

## 2021-09-02 MED ORDER — GLYCOPYRROLATE PF 0.2 MG/ML IJ SOSY
PREFILLED_SYRINGE | INTRAMUSCULAR | Status: AC
  Filled 2021-09-02: qty 1

## 2021-09-02 MED ORDER — AMISULPRIDE (ANTIEMETIC) 5 MG/2ML IV SOLN: 10.0000 mg | Freq: Once | INTRAVENOUS | Status: DC | PRN

## 2021-09-02 MED ORDER — OXYCODONE HCL 5 MG PO TABS
ORAL_TABLET | ORAL | Status: AC
  Filled 2021-09-02: qty 2

## 2021-09-02 MED ORDER — LIDOCAINE-EPINEPHRINE 1 %-1:100000 IJ SOLN
INTRAMUSCULAR | Status: AC
  Filled 2021-09-02: qty 1

## 2021-09-02 MED ORDER — VANCOMYCIN HCL IN DEXTROSE 1-5 GM/200ML-% IV SOLN
1000.0000 mg | INTRAVENOUS | Status: AC
  Administered 2021-09-02: 1000 mg via INTRAVENOUS
  Filled 2021-09-02: qty 200

## 2021-09-02 MED ORDER — ROSUVASTATIN CALCIUM 5 MG PO TABS
10.0000 mg | ORAL_TABLET | Freq: Every day | ORAL | Status: DC
  Administered 2021-09-03 – 2021-09-06 (×4): 10 mg via ORAL
  Filled 2021-09-02 (×5): qty 2

## 2021-09-02 MED ORDER — GLYCOPYRROLATE PF 0.2 MG/ML IJ SOSY
PREFILLED_SYRINGE | INTRAMUSCULAR | Status: DC | PRN
  Administered 2021-09-02: .2 mg via INTRAVENOUS

## 2021-09-02 MED ORDER — ORAL CARE MOUTH RINSE: 15.0000 mL | Freq: Once | OROMUCOSAL | Status: AC

## 2021-09-02 MED ORDER — SUCCINYLCHOLINE CHLORIDE 200 MG/10ML IV SOSY
PREFILLED_SYRINGE | INTRAVENOUS | Status: DC | PRN
  Administered 2021-09-02: 140 mg via INTRAVENOUS

## 2021-09-02 MED ORDER — ONDANSETRON HCL 4 MG/2ML IJ SOLN
4.0000 mg | Freq: Four times a day (QID) | INTRAMUSCULAR | Status: DC | PRN
  Administered 2021-09-02: 4 mg via INTRAVENOUS

## 2021-09-02 MED ORDER — ONDANSETRON HCL 4 MG PO TABS: 4.0000 mg | ORAL_TABLET | Freq: Four times a day (QID) | ORAL | Status: DC | PRN

## 2021-09-02 MED ORDER — HYDROMORPHONE HCL 1 MG/ML IJ SOLN
INTRAMUSCULAR | Status: DC | PRN
  Administered 2021-09-02: .5 mg via INTRAVENOUS

## 2021-09-02 MED ORDER — MENTHOL 3 MG MT LOZG: 1.0000 | LOZENGE | OROMUCOSAL | Status: DC | PRN

## 2021-09-02 MED ORDER — KETAMINE HCL 50 MG/5ML IJ SOSY
PREFILLED_SYRINGE | INTRAMUSCULAR | Status: AC
  Filled 2021-09-02: qty 5

## 2021-09-02 MED ORDER — LIDOCAINE-EPINEPHRINE 1 %-1:100000 IJ SOLN
INTRAMUSCULAR | Status: DC | PRN
  Administered 2021-09-02 (×2): 5 mL

## 2021-09-02 MED ORDER — THROMBIN 5000 UNITS EX SOLR: OROMUCOSAL | Status: DC | PRN

## 2021-09-02 MED ORDER — TAMSULOSIN HCL 0.4 MG PO CAPS
0.4000 mg | ORAL_CAPSULE | Freq: Every day | ORAL | Status: DC
  Administered 2021-09-02 – 2021-09-06 (×5): 0.4 mg via ORAL
  Filled 2021-09-02 (×5): qty 1

## 2021-09-02 MED ORDER — THROMBIN 20000 UNITS EX SOLR
CUTANEOUS | Status: AC
  Filled 2021-09-02: qty 20000

## 2021-09-02 MED ORDER — PHENYLEPHRINE HCL-NACL 20-0.9 MG/250ML-% IV SOLN
INTRAVENOUS | Status: DC | PRN
  Administered 2021-09-02: 50 ug/min via INTRAVENOUS

## 2021-09-02 MED ORDER — BUPIVACAINE-EPINEPHRINE 0.5% -1:200000 IJ SOLN
INTRAMUSCULAR | Status: DC | PRN
  Administered 2021-09-02 (×2): 5 mL

## 2021-09-02 MED ORDER — PHENYLEPHRINE HCL (PRESSORS) 10 MG/ML IV SOLN
INTRAVENOUS | Status: DC | PRN
  Administered 2021-09-02 (×2): 80 ug via INTRAVENOUS
  Administered 2021-09-02: 120 ug via INTRAVENOUS
  Administered 2021-09-02: 160 ug via INTRAVENOUS
  Administered 2021-09-02: 120 ug via INTRAVENOUS

## 2021-09-02 MED ORDER — HYDROMORPHONE HCL 1 MG/ML IJ SOLN
INTRAMUSCULAR | Status: AC
  Filled 2021-09-02: qty 0.5

## 2021-09-02 MED ORDER — FENTANYL CITRATE (PF) 100 MCG/2ML IJ SOLN
25.0000 ug | INTRAMUSCULAR | Status: DC | PRN
  Administered 2021-09-02 (×3): 50 ug via INTRAVENOUS

## 2021-09-02 MED ORDER — BUPIVACAINE-EPINEPHRINE 0.5% -1:200000 IJ SOLN
INTRAMUSCULAR | Status: AC
  Filled 2021-09-02: qty 1

## 2021-09-02 MED ORDER — SUCCINYLCHOLINE CHLORIDE 200 MG/10ML IV SOSY
PREFILLED_SYRINGE | INTRAVENOUS | Status: AC
  Filled 2021-09-02: qty 10

## 2021-09-02 MED ORDER — EPHEDRINE SULFATE-NACL 50-0.9 MG/10ML-% IV SOSY
PREFILLED_SYRINGE | INTRAVENOUS | Status: DC | PRN
  Administered 2021-09-02: 10 mg via INTRAVENOUS
  Administered 2021-09-02: 5 mg via INTRAVENOUS

## 2021-09-02 MED ORDER — ONDANSETRON HCL 4 MG/2ML IJ SOLN
INTRAMUSCULAR | Status: DC | PRN
  Administered 2021-09-02: 4 mg via INTRAVENOUS

## 2021-09-02 MED ORDER — DOCUSATE SODIUM 100 MG PO CAPS
100.0000 mg | ORAL_CAPSULE | Freq: Two times a day (BID) | ORAL | Status: DC
  Administered 2021-09-03 – 2021-09-07 (×9): 100 mg via ORAL
  Filled 2021-09-02 (×9): qty 1

## 2021-09-02 MED ORDER — SODIUM CHLORIDE 0.9% FLUSH: 3.0000 mL | INTRAVENOUS | Status: DC | PRN

## 2021-09-02 MED ORDER — ESMOLOL HCL 100 MG/10ML IV SOLN
INTRAVENOUS | Status: DC | PRN
  Administered 2021-09-02: 20 mg via INTRAVENOUS

## 2021-09-02 SURGICAL SUPPLY — 98 items
ADH SKN CLS APL DERMABOND .7 (GAUZE/BANDAGES/DRESSINGS) ×2
BAG BANDED W/RUBBER/TAPE 36X54 (MISCELLANEOUS) ×6 IMPLANT
BAG COUNTER SPONGE SURGICOUNT (BAG) ×4 IMPLANT
BAG EQP BAND 135X91 W/RBR TAPE (MISCELLANEOUS) ×2
BAG SPNG CNTER NS LX DISP (BAG) ×2
BAG SURGICOUNT SPONGE COUNTING (BAG) ×2
BAND INSRT 18 STRL LF DISP RB (MISCELLANEOUS)
BAND RUBBER #18 3X1/16 STRL (MISCELLANEOUS) IMPLANT
BASKET BONE COLLECTION (BASKET) IMPLANT
BLADE CLIPPER SURG (BLADE) IMPLANT
BUR CARBIDE MATCH 3.0 (BURR) ×3 IMPLANT
CAGE MODULUS XL 12X18X50 - 10 (Cage) ×2 IMPLANT
CAGE MODULUS XLS 8X22X50 5D (Cage) ×2 IMPLANT
CARTRIDGE OIL MAESTRO DRILL (MISCELLANEOUS) ×1 IMPLANT
CNTNR URN SCR LID CUP LEK RST (MISCELLANEOUS) ×1 IMPLANT
CONT SPEC 4OZ STRL OR WHT (MISCELLANEOUS) ×3
COVER BACK TABLE 60X90IN (DRAPES) ×3 IMPLANT
DECANTER SPIKE VIAL GLASS SM (MISCELLANEOUS) ×6 IMPLANT
DERMABOND ADVANCED (GAUZE/BANDAGES/DRESSINGS) ×4
DERMABOND ADVANCED .7 DNX12 (GAUZE/BANDAGES/DRESSINGS) ×2 IMPLANT
DIFFUSER DRILL AIR PNEUMATIC (MISCELLANEOUS) ×3 IMPLANT
DRAIN JACKSON RD 7FR 3/32 (WOUND CARE) IMPLANT
DRAPE C-ARM 42X72 X-RAY (DRAPES) ×6 IMPLANT
DRAPE C-ARMOR (DRAPES) ×3 IMPLANT
DRAPE LAPAROTOMY 100X72X124 (DRAPES) ×6 IMPLANT
DRAPE MICROSCOPE LEICA (MISCELLANEOUS) IMPLANT
DRAPE SURG 17X23 STRL (DRAPES) ×3 IMPLANT
DRSG OPSITE 4X5.5 SM (GAUZE/BANDAGES/DRESSINGS) ×2 IMPLANT
DRSG OPSITE POSTOP 3X4 (GAUZE/BANDAGES/DRESSINGS) ×2 IMPLANT
DRSG OPSITE POSTOP 4X8 (GAUZE/BANDAGES/DRESSINGS) ×2 IMPLANT
DRSG PAD ABDOMINAL 8X10 ST (GAUZE/BANDAGES/DRESSINGS) ×3 IMPLANT
DURAPREP 26ML APPLICATOR (WOUND CARE) ×6 IMPLANT
ELECT BLADE INSULATED 4IN (ELECTROSURGICAL) ×3
ELECT COATED BLADE 2.86 ST (ELECTRODE) ×3 IMPLANT
ELECT REM PT RETURN 9FT ADLT (ELECTROSURGICAL) ×6
ELECTRODE BLADE INSULATED 4IN (ELECTROSURGICAL) ×1 IMPLANT
ELECTRODE REM PT RTRN 9FT ADLT (ELECTROSURGICAL) ×2 IMPLANT
EVACUATOR 1/8 PVC DRAIN (DRAIN) ×2 IMPLANT
GAUZE 4X4 16PLY ~~LOC~~+RFID DBL (SPONGE) IMPLANT
GAUZE SPONGE 4X4 12PLY STRL (GAUZE/BANDAGES/DRESSINGS) ×3 IMPLANT
GAUZE SPONGE 4X4 12PLY STRL LF (GAUZE/BANDAGES/DRESSINGS) ×2 IMPLANT
GLOVE SRG 8 PF TXTR STRL LF DI (GLOVE) ×3 IMPLANT
GLOVE SURG LTX SZ7 (GLOVE) ×8 IMPLANT
GLOVE SURG LTX SZ8 (GLOVE) ×13 IMPLANT
GLOVE SURG UNDER POLY LF SZ7.5 (GLOVE) ×8 IMPLANT
GLOVE SURG UNDER POLY LF SZ8 (GLOVE) ×9
GLOVE SURG UNDER POLY LF SZ8.5 (GLOVE) ×10 IMPLANT
GOWN STRL REUS W/ TWL LRG LVL3 (GOWN DISPOSABLE) IMPLANT
GOWN STRL REUS W/ TWL XL LVL3 (GOWN DISPOSABLE) ×3 IMPLANT
GOWN STRL REUS W/TWL 2XL LVL3 (GOWN DISPOSABLE) IMPLANT
GOWN STRL REUS W/TWL LRG LVL3 (GOWN DISPOSABLE)
GOWN STRL REUS W/TWL XL LVL3 (GOWN DISPOSABLE) ×9
HEMOSTAT POWDER KIT SURGIFOAM (HEMOSTASIS) ×5 IMPLANT
KIT BASIN OR (CUSTOM PROCEDURE TRAY) ×6 IMPLANT
KIT DILATOR XLIF 5 (KITS) IMPLANT
KIT INFUSE XX SMALL 0.7CC (Orthopedic Implant) ×6 IMPLANT
KIT POSITION SURG JACKSON T1 (MISCELLANEOUS) ×3 IMPLANT
KIT SURGICAL ACCESS MAXCESS 4 (KITS) ×2 IMPLANT
KIT TURNOVER KIT B (KITS) ×6 IMPLANT
KIT XLIF (KITS) ×2
MARKER SKIN DUAL TIP RULER LAB (MISCELLANEOUS) ×3 IMPLANT
MODULE NVM5 NEXT GEN EMG (NEEDLE) ×2 IMPLANT
NDL HYPO 21X1.5 SAFETY (NEEDLE) IMPLANT
NDL HYPO 25X1 1.5 SAFETY (NEEDLE) ×2 IMPLANT
NEEDLE HYPO 21X1.5 SAFETY (NEEDLE) ×3 IMPLANT
NEEDLE HYPO 25X1 1.5 SAFETY (NEEDLE) ×6 IMPLANT
NS IRRIG 1000ML POUR BTL (IV SOLUTION) ×6 IMPLANT
OIL CARTRIDGE MAESTRO DRILL (MISCELLANEOUS) ×3
PACK LAMINECTOMY NEURO (CUSTOM PROCEDURE TRAY) ×6 IMPLANT
PAD ARMBOARD 7.5X6 YLW CONV (MISCELLANEOUS) ×9 IMPLANT
PATTIES SURGICAL .5 X.5 (GAUZE/BANDAGES/DRESSINGS) IMPLANT
PATTIES SURGICAL .5 X1 (DISPOSABLE) IMPLANT
PATTIES SURGICAL 1X1 (DISPOSABLE) IMPLANT
PLATE DECADE XLIP 2H SZ12 (Plate) ×2 IMPLANT
PUTTY BONE ATTRAX 10CC STRIP (Putty) ×2 IMPLANT
PUTTY DBM PROPEL MEDIUM (Putty) ×1 IMPLANT
PUTTY PROPEL MEDIUM (Putty) ×1 IMPLANT
SCREW DECADE 5.5X50 (Screw) ×4 IMPLANT
SCREW LOCK RELINE 5.5 TULIP (Screw) ×14 IMPLANT
SCREW RELINE-O POLY 5.5X45MM (Screw) ×8 IMPLANT
SCREW RELINE-O POLY 6.5X45 (Screw) ×4 IMPLANT
SCREW RELINE-O POLY 6.5X50MM (Screw) ×2 IMPLANT
SPONGE SURGIFOAM ABS GEL 100 (HEMOSTASIS) ×3 IMPLANT
SPONGE SURGIFOAM ABS GEL SZ50 (HEMOSTASIS) IMPLANT
SPONGE T-LAP 4X18 ~~LOC~~+RFID (SPONGE) IMPLANT
STAPLER VISISTAT 35W (STAPLE) ×6 IMPLANT
SUT VIC AB 0 CT1 18XCR BRD8 (SUTURE) ×1 IMPLANT
SUT VIC AB 0 CT1 8-18 (SUTURE) ×12
SUT VIC AB 1 CT1 18XBRD ANBCTR (SUTURE) IMPLANT
SUT VIC AB 1 CT1 8-18 (SUTURE)
SUT VIC AB 2-0 CP2 18 (SUTURE) ×8 IMPLANT
SUT VIC AB 3-0 SH 8-18 (SUTURE) ×4 IMPLANT
SYR 20ML LL LF (SYRINGE) ×2 IMPLANT
TEMPLATE ROD SILICON 250 (ORTHOPEDIC DISPOSABLE SUPPLIES) ×2 IMPLANT
TOWEL GREEN STERILE (TOWEL DISPOSABLE) ×3 IMPLANT
TOWEL GREEN STERILE FF (TOWEL DISPOSABLE) ×3 IMPLANT
TRAY FOLEY MTR SLVR 16FR STAT (SET/KITS/TRAYS/PACK) ×6 IMPLANT
WATER STERILE IRR 1000ML POUR (IV SOLUTION) ×6 IMPLANT

## 2021-09-02 NOTE — Anesthesia Procedure Notes (Signed)
Arterial Line Insertion Start/End11/22/2022 7:20 AM, 09/02/2021 7:25 AM Performed by: Murvin Natal, MD, Betha Loa, CRNA, CRNA  Patient location: Pre-op. Preanesthetic checklist: patient identified, IV checked, site marked, risks and benefits discussed, surgical consent, monitors and equipment checked, pre-op evaluation, timeout performed and anesthesia consent Lidocaine 1% used for infiltration radial was placed Catheter size: 20 G Hand hygiene performed , maximum sterile barriers used  and Seldinger technique used Allen's test indicative of satisfactory collateral circulation Attempts: 1 Procedure performed without using ultrasound guided technique. Following insertion, dressing applied. Post procedure assessment: normal and unchanged  Patient tolerated the procedure well with no immediate complications.

## 2021-09-02 NOTE — Progress Notes (Signed)
Pharmacy Antibiotic Note  Jimmy Rangel is a 75 y.o. male admitted on 09/02/2021 with back pain. He is now s/p spinal procedure with a drain in place and pharmacy consulted to dose vancomycin. -1000mg  IV vancomcyin given at 6:30am -Baseline SCr ~ 0.9, CrCL ~ 70   Plan: -Vancomycin 1000mg  IV q12h -BMET in am -Will follow plans for drain removal  Height: 5\' 11"  (180.3 cm) Weight: 92.5 kg (204 lb) IBW/kg (Calculated) : 75.3  Temp (24hrs), Avg:98.4 F (36.9 C), Min:98.2 F (36.8 C), Max:98.6 F (37 C)  No results for input(s): WBC, CREATININE, LATICACIDVEN, VANCOTROUGH, VANCOPEAK, VANCORANDOM, GENTTROUGH, GENTPEAK, GENTRANDOM, TOBRATROUGH, TOBRAPEAK, TOBRARND, AMIKACINPEAK, AMIKACINTROU, AMIKACIN in the last 168 hours.  CrCl cannot be calculated (Patient's most recent lab result is older than the maximum 21 days allowed.).    Allergies  Allergen Reactions   Atorvastatin     Myalgia   Penicillins Other (See Comments)    Passed out Has patient had a PCN reaction causing immediate rash, facial/tongue/throat swelling, SOB or lightheadedness with hypotension: Yes Has patient had a PCN reaction causing severe rash involving mucus membranes or skin necrosis: No Has patient had a PCN reaction that required hospitalization: No Has patient had a PCN reaction occurring within the last 10 years: No If all of the above answers are "NO", then may proceed with Cephalosporin use.      Thank you for allowing pharmacy to be a part of this patient's care.  Hildred Laser, PharmD Clinical Pharmacist **Pharmacist phone directory can now be found on [email or web address] ([email or web address]).  Listed under [place].

## 2021-09-02 NOTE — Anesthesia Procedure Notes (Signed)
Procedure Name: Intubation Date/Time: 09/02/2021 7:49 AM Performed by: Cathren Harsh, CRNA Pre-anesthesia Checklist: Patient identified, Emergency Drugs available, Suction available and Patient being monitored Patient Re-evaluated:Patient Re-evaluated prior to induction Oxygen Delivery Method: Circle System Utilized Preoxygenation: Pre-oxygenation with 100% oxygen Induction Type: IV induction Ventilation: Mask ventilation without difficulty Laryngoscope Size: Glidescope and 4 Tube type: Oral Tube size: 7.5 mm Number of attempts: 1 Airway Equipment and Method: Stylet and Oral airway Placement Confirmation: ETT inserted through vocal cords under direct vision, positive ETCO2 and breath sounds checked- equal and bilateral Secured at: 23 cm Tube secured with: Tape Dental Injury: Teeth and Oropharynx as per pre-operative assessment  Difficulty Due To: Difficult Airway- due to large tongue

## 2021-09-02 NOTE — Progress Notes (Signed)
Patient's debit/credit card, wedding band, and insurance card taken to wife in waiting room.

## 2021-09-02 NOTE — Progress Notes (Signed)
   Providing Compassionate, Quality Care - Together  NEUROSURGERY PROGRESS NOTE   S: pt s/e in pacu, complaining of appropriate pain  O: EXAM:  BP (!) 134/91 (BP Location: Right Arm)   Pulse 93   Temp 98.6 F (37 C)   Resp 14   Ht 5\' 11"  (1.803 m)   Wt 92.5 kg   SpO2 94%   BMI 28.45 kg/m   Awake, alert, oriented  PERRL Speech fluent, appropriate  CNs grossly intact  5/5 BUE 4+/5 BLE  Abd soft  ASSESSMENT:  75 y.o. 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  Please do not hesitate to call with questions or concerns.   Elwin Sleight, [facility] Cell: [phone]

## 2021-09-02 NOTE — H&P (Signed)
Providing Compassionate, Quality Care - Together  NEUROSURGERY HISTORY & PHYSICAL   Jimmy Rangel is an 75 y.o. male.   Chief Complaint: Bilateral lower extremity radiculopathy, worsening low back pain HPI: This is a 75 year old male with a history of multiple spine surgeries, most recently in L2-4 left complains of progressive low back pain and presents today for surgical revision and extension fusion.  He complains of right L3-4 radiculopathy and left L5 radiculopathy that is progressively worsened despite conservative measures including steroid injections and pain control.  He denies any bowel or bladder changes.  He has received medical clearance.  Past Medical History:  Diagnosis Date   Actinic keratosis    Arthritis    Basal cell carcinoma 10/13/2006   Basal cell carcinoma 10/13/2006   right anterior deltoid   Basal cell carcinoma 08/16/2007   left distal posterior deltoid   Basal cell carcinoma 07/31/2009   left zygomatic lat canthus    Basal cell carcinoma 07/14/2010   right medial chest, EDC   Basal cell carcinoma 02/26/2020   left sideburn ED&C   Basal cell carcinoma 02/26/2020   left pretibia ED&C   Basal cell carcinoma 07/02/2020   L mid pretibia   Basal cell carcinoma 05/15/2020   left mid pretibia  superficial    Basal cell carcinoma 02/17/2021   right posterior neck, schedule EDC   Basal cell carcinoma 07/07/2021   L mid sternum, EDC   Basal cell carcinoma    Basal cell carcinoma (BCC) 02/17/2021   right upper nasolabial, MOHs 05/01/21   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 Valve mobility was restricted.   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   right superior pectoral   Nodular basal cell carcinoma 06/02/2019   right nasolabial   Primary localized osteoarthritis of right knee    Sleep apnea    uses CPAP, does not know settings   Superficial basal cell carcinoma 08/16/2007   left distal anterior deltoid   Superficial basal cell carcinoma 08/16/2007   left mid lateral bicep   Superficial basal cell carcinoma 02/18/2009   right medial pretibial   Superficial basal cell carcinoma 02/18/2009   right lateral calf   Weakness    left leg    Past Surgical History:  Procedure Laterality Date   BACK SURGERY     Lumbar fusion x2, 1997, 2016   CHOLECYSTECTOMY     COLONOSCOPY     ESOPHAGOGASTRODUODENOSCOPY     EYE SURGERY Right 2021   cataract   [place]   had to put screws in left elbow   INCISIONAL HERNIA REPAIR N/A 08/09/2020   Procedure: LAPAROSCOPIC REPAIR INCISIONAL HERNIA WITH MESH AND LYSIS OF ADHESIONS;  Surgeon: Greer Pickerel, MD;  Location: [facility];  Service: General;  Laterality: N/A;   INJECTION KNEE Left 09/19/2018   Procedure: LEFT KNEE INJECTION;  Surgeon: Elsie Saas, MD;  Location: [facility];  Service: Orthopedics;  Laterality: Left;   KNEE ARTHROSCOPY Right    spleenectomy     TOTAL KNEE ARTHROPLASTY Right  09/19/2018   Procedure: TOTAL KNEE ARTHROPLASTY;  Surgeon: Elsie Saas, MD;  Location: [facility];  Service: Orthopedics;  Laterality: Right;    Family History  Problem Relation Age of Onset   Leukemia Mother    Lung cancer Father    Social History:  reports that he has never smoked. He has never used smokeless tobacco. He reports that he does not currently use alcohol. He reports that he does not use drugs.  Allergies:  Allergies  Allergen Reactions   Atorvastatin     Myalgia   Penicillins Other (See Comments)    Passed out Has patient had a PCN reaction causing immediate rash, facial/tongue/throat swelling, SOB  or lightheadedness with hypotension: Yes Has patient had a PCN reaction causing severe rash involving mucus membranes or skin necrosis: No Has patient had a PCN reaction that required hospitalization: No Has patient had a PCN reaction occurring within the last 10 years: No If all of the above answers are "NO", then may proceed with Cephalosporin use.     Medications Prior to Admission  Medication Sig Dispense Refill   Carboxymethylcellulose Sodium (REFRESH TEARS OP) Place 1 drop into both eyes daily as needed (Dry eyes).     diclofenac (VOLTAREN) 75 MG EC tablet Take 75 mg by mouth daily.     docusate sodium (COLACE) 100 MG capsule 1 tab 2 times a day while on narcotics.  STOOL SOFTENER (Patient taking differently: 1 tab 2 times a day while on narcotics.  STOOL SOFTENER) 60 capsule 0   fluticasone (FLONASE) 50 MCG/ACT nasal spray Place 2 sprays into both nostrils daily as needed for allergies or rhinitis.      GLUCOSAMINE-CHONDROITIN PO Take 1,500 mg by mouth daily.     Multiple Vitamin (MULTIVITAMIN WITH MINERALS) TABS tablet Take 1 tablet by mouth daily. Package from [facility]     Omega-3 1000 MG CAPS Take 1,000 mg by mouth daily.     omeprazole (PRILOSEC) 10 MG capsule Take 10 mg by mouth daily.     OVER THE COUNTER MEDICATION Apply 1 application topically daily. Hemp cream     rosuvastatin (CRESTOR) 10 MG tablet Take 10 mg by mouth at bedtime.     tamsulosin (FLOMAX) 0.4 MG CAPS capsule Take 0.4 mg by mouth at bedtime.      No results found for this or any previous visit (from the past 48 hour(s)). No results found.  ROS All positives and negatives are listed in HPI above  Blood pressure (!) 162/89, pulse 94, temperature 98.2 F (36.8 C), temperature source Oral, resp. rate 17, height 5\' 11"  (1.803 m), weight 92.5 kg, SpO2 96 %. 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  We discussed all risks, benefits and expected outcomes.  Patient like to proceed with surgical intervention.   Thank you for allowing me to participate in this patient's care.  Please do not hesitate to call with questions or concerns.   Elwin Sleight, [facility] Cell: [phone]

## 2021-09-02 NOTE — Progress Notes (Signed)
Orthopedic Tech Progress Note Patient Details:  Jimmy Rangel 05-05-1946 [id number]  Patient ID: [name], male   DOB: [birth year], 75 y.o.   MRN: [id number] Brace dropped off.   Edwina Barth 09/02/2021, 6:55 PM

## 2021-09-02 NOTE — Transfer of Care (Signed)
Immediate Anesthesia Transfer of Care Note  Patient: RONNELL CLINGER  Procedure(s) Performed: Sampson Goon: Anterior lateral lumbar fusion, Lumbar four- lumbar five, lumbar one- lumabr -two (Spine Lumbar) STAGE II: POSTERIOR LUMBAR INSTRUMENTATION, FUSION LUMBAR ONE- LUMBAR TWO, LUMBAR FOUR- LUMBAR FIVE, EXPLORE AND REVISION LUMBAR TWO-LUMBAR THREE, LUMBAR THREE- LUMBAR FOUR,WITH LUMBAR THREE- LUMBAR FOUR DECOMPRESSION (Spine Lumbar)  Patient Location: PACU  Anesthesia Type:General  Level of Consciousness: drowsy, patient cooperative and responds to stimulation  Airway & Oxygen Therapy: Patient Spontanous Breathing and Patient connected to nasal cannula oxygen  Post-op Assessment: Report given to RN, Post -op Vital signs reviewed and stable and Patient moving all extremities X 4  Post vital signs: Reviewed and stable  Last Vitals:  Vitals Value Taken Time  BP 126/78 09/02/21 1619  Temp    Pulse 100 09/02/21 1621  Resp 16 09/02/21 1621  SpO2 97 % 09/02/21 1621  Vitals shown include unvalidated device data.  Last Pain:  Vitals:   09/02/21 0609  TempSrc:   PainSc: 0-No pain         Complications: No notable events documented.

## 2021-09-02 NOTE — Op Note (Signed)
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, spondylosis and radiculopathy  POSTOP DIAGNOSIS: Same  PROCEDURE:  Stage I: Anterior lateral lumbar interbody fusion L1-2, L4-5, left-sided approach Placement of anterior biomechanical device L1-2, L4-5; NuVasive 8 x 22 x 50 mm 5 degree lordotic titanium interbody at L1-2, 12x18 x 50 mm 10 degree lordotic titanium interbody at L4-5 Placement of lateral plate with screws at L4-5; 8 mm plate, 5.5 mm x 50 mm screws x2 Intraoperative use of allograft Intraoperative use of BMP, extra small x2 Intraoperative use of neuro monitoring  Stage II: Posterolateral lumbar arthrodesis, L1-2, L4-5 Exploration of fusion L2-3, L3-4 with revision fusion at L3-4 Removal of hardware bilateral L2, bilateral L3, bilateral L4 pedicle screws Segmental pedicle screw instrumentation bilateral L1, L2, L3, left L4; (NuVasive 5.5 mm x 45 mm bilaterally at L1, 5.5 mm x 45 mm bilaterally at L2, 6.5 mm x 45 mm bilaterally at L3, 6.5 mm x 50 mm unilateral on the left) Revision right L3-4 laminectomy, lateral recess decompression and facetectomy for decompression of the L3 and L4 nerve roots Intraoperative use of autograft Intraoperative use of allograft Intraoperative use of BMP, extra extra small Intraoperative use of fluoroscopy, greater than 1 hour Intraoperative use of microscope for microdissection  SURGEON: Dr. Pieter Partridge C. Kashari Chalmers, DO  ASSISTANT: Weston Brass, NP; Verdis Prime, RN  ANESTHESIA: General Endotracheal  EBL: 200 cc  SPECIMENS: None  DRAINS: Medium Hemovac  COMPLICATIONS: None  CONDITION: Hemodynamically stable  HISTORY: Jimmy Rangel is a 75 y.o. male with a history of an F7-5 PLIF complicated by Z0-2  nonunion with a right fractured L4 pedicle screw and no definitive arthrodesis in the interspace.  There is also right greater than left lateral recess and foraminal stenosis that was recurrent at this level.  He also had significant degenerative disc disease with retrolisthesis of L1 on L2 with moderate lateral recess and foraminal stenosis bilaterally.  He has a history of previous Harrington rod fusion L5-S2 with previous decompression at L4-5 with recurrent lateral recess stenosis bilaterally.  He had undergone multiple conservative measures in which his low back pain and bilateral radiculopathy recurred.  We discussed surgical intervention in the form of exploration of fusion L2-4, anterior lateral interbody fusion L1-2 and L4-5 with revision of hardware posteriorly and revision decompression on the right at L3-4.  We discussed all benefits, risks and expected outcomes and he agreed to proceed.  PROCEDURE IN DETAIL: The patient was brought to the operating room. After induction of general anesthesia, the patient was positioned on the operative table in the right lateral decubitus with the left side superior position. All pressure points were meticulously padded. Skin incisions was marked using AP and lateral fluoroscopy for L1-2 and L4-5 lateral approaches and prepped and draped in the usual sterile fashion.  Physician driven timeout was performed.  Local anesthetic was injected into the incisions.  Using a 10 blade, incision was made sharply through the soft tissue down to the muscular fascia over the L4-5 level.  Fascia was opened sharply with Bovie electrocautery.  Using blunt technique the abdominal muscular layers were carefully dissected until retroperitoneal fat was identified.  The retroperitoneal space was palpated bluntly with my finger and gently dissected and confirmed palpation of the quadratus, transverse processes and psoas musculature.  Initial dilator was  then placed into the psoas at L4-5  level with neuro monitoring.  Neuro monitoring confirmed the neural plexus to be posterior appropriately therefore K wire was placed in the disc base.  The psoas was progressively dilated with neuro monitoring again confirming the plexus posteriorly and retractor was placed.  AP and lateral fluoroscopy confirmed appropriate trajectories.  The lateral annulus was identified, stimulation was performed circumferentially and posteriorly without any significant stimulation.  Posterior shim was placed.  Annulotomy was performed with bipolar cautery and 15 blade.  A radical discectomy was performed using a series of Cobb's, rongeurs, box cutter and trials.  Superior and inferior endplate preparation was performed using rasps.  Using AP and lateral fluoroscopy, the above listed interbody device was selected.  This was filled with allograft and BMP and placed under live fluoroscopy.  Using a Penfield 4, the lateral vertebral body of L4 and L5 psoas was gently dissected to make room for the lateral plate.  The above size plate was selected, a pilot hole was created and the above screws were selected and placed under live fluoroscopy.  There was appropriate bony purchase.  The plate was leg down to the vertebral bodies.  It was then final tightened per the manufacturer's recommendation. Hemostasis was achieved with bipolar cautery the phone.  The retractor was gently let down and the psoas musculature returned to its normal position and was hemostased.  The retractor was gently removed.  Attention was then turned to the L1-2 discectomy and interbody placement. Using a 10 blade, incision was made sharply through the soft tissue down to the muscular fascia over the L 1-2 level.  Using blunt dissection from the previous retroperitoneal access, the retroperitoneal space was continued superiorly to the L1-2 level.  The initial dilator was placed between the 11th and 12th rib bluntly while protecting with my finger in the  retroperitoneal space.  Initial dilator was then placed into the psoas at L 1-2 level with neuro monitoring.  Neuro monitoring confirmed the neural plexus to be inferior appropriately therefore K wire was placed in the disc base.  The psoas was progressively dilated with neuro monitoring again confirming the plexus inferior and retractor was placed.  AP and lateral fluoroscopy confirmed appropriate trajectories.  The lateral annulus was identified, stimulation was performed circumferentially and posteriorly without any significant stimulation.  Posterior shim was placed.  Annulotomy was performed with bipolar cautery and 15 blade.  There was a moderate sized lateral osteophyte which was removed with osteophyte tool.  A radical discectomy was performed using a series of Cobb's, rongeurs, box cutter and trials.  Superior and inferior endplate preparation was performed using rasps.  Using AP and lateral fluoroscopy, the above listed interbody device was selected.  This was filled with allograft and BMP and placed under live fluoroscopy.  Hemostasis was achieved with bipolar cautery.  The retractor was gently let down and the psoas musculature returned to its normal position and was hemostased.  The retractor was gently removed. AP and lateral fluoroscopy confirmed appropriate placement of hardware.  Attention was then turned to closure. Hemostasis was achieved with bipolar cautery. Both lateral incisions were closed with 0-vicryl suture for the abdomincal fascia. The dermis was closed with 2-0 and 3-0 vicryl sutures. Skin was closed with skin glue. Sterile dressing applied. At this juncture, all softs and sharps counts were correct x2. The drapes were removed and the patient was carefully moved onto a gurney and thenpositioned prone on a jackson table for the  second stage of the procedure. All bony prominences were appropriately padded.   The lumbar region was sterilely prepped and draped in a normal fashion. The  incision was marked. Physician driven timeout was performed. Local anesthetic was injected into the planned incision. Using a 10 blade, sharp dissection was performed through the lumbar dermis down to the dorsal fascia. Bovie cautery was used to expose the previous L2-4 hardware bilaterally. Subperiosteal dissection was performed at L1 bilaterally to expose the TP and lamina, L1-2 facets. Deep retractors were placed. The previous lumbar pedicle screws and rods were removed. The screw at right right L4 pedicle was fractured and the remaining portion within the pedicle was not visualized. The cross link encountered was fractured as well, with evidence of metallosis in the surrounding soft tissue. The bilateral L2, L3 and left L4 screw were replaced into the previous tract without issue, sizing listed above. AP and lateral fluoroscopy confirmed appropriate placement. Pilot holes were then created bilaterally at L1.  The pedicles were accessed bilaterally with pedicle finder.  This was confirmed in AP and lateral fluoroscopy.  Ball-tipped probe confirmed bony borders in all directions and at the floor.  The above listed pedicle screws were then placed bilaterally with appropriate bony purchase.  AP and lateral fluoroscopy confirmed appropriate placement.  The microscope was then sterilely draped and brought into the field.  A revision right laminectomy at L3-4 and complete facetectomy at L3-4 was performed with high-speed drill down to the epidural space, while saving autograft for later use.  The lateral thecal sac and exiting nerve root and traversing nerve root were identified and adequately decompressed with Kerrison rongeurs.  The annulus was identified, coagulated with bipolar cautery and cut with an 11 blade.  Remaining disc material was removed with a series of disc space shavers and Kerrison rongeurs and pituitary rongeurs.  The disc base was mobile and noted to be nonunion.  Mixture of autograft, BMP and  allograft was placed in the disc space for the revision fusion.  Epidural hemostasis was achieved with Surgifoam and bipolar cautery.  Using a ball-tipped probe, the traversing nerve, exiting nerve and common dural tube was noted to be adequately decompressed.   Attention was then turned to rod placement.  The appropriate size rods were then obtained, contoured and placed bilaterally.  Setscrews were placed and final tightened bilaterally, these were final tightened to the manufacturer's recommendation.  The L1-2 bilateral facet joints, remaining left L3-4 facet joint was decorticated with a high-speed drill.  The remaining autograft allograft and BMP was placed along the lateral gutters at these levels.  Final fluoroscopic AP and lateral imaging confirmed appropriate placement.  Deep retractors were taken out of the wound.  Hemostasis was achieved with bipolar cautery.  A medium Hemovac was tunneled laterally and placed in the epidural space.  Muscle and fascia was closed with 0 Vicryl sutures.  Dermis was closed with 2-0 and 3-0 Vicryl sutures.  Skin was closed with skin glue.  Sterile dressing applied.  At the end of the case all sponge, needle, and instrument counts were correct. The patient was then transferred to the stretcher, extubated, and taken to the post-anesthesia care unit in stable hemodynamic condition.

## 2021-09-03 ENCOUNTER — Encounter (HOSPITAL_COMMUNITY): Payer: Self-pay | Admitting: Neurological Surgery

## 2021-09-03 LAB — BASIC METABOLIC PANEL
Anion gap: 9 (ref 5–15)
BUN: 11 mg/dL (ref 8–23)
CO2: 25 mmol/L (ref 22–32)
Calcium: 8.8 mg/dL — ABNORMAL LOW (ref 8.9–10.3)
Chloride: 101 mmol/L (ref 98–111)
Creatinine, Ser: 0.82 mg/dL (ref 0.61–1.24)
GFR, Estimated: >60 mL/min (ref >60)
Glucose, Bld: 166 mg/dL — ABNORMAL HIGH (ref 70–99)
Potassium: 3.9 mmol/L (ref 3.5–5.1)
Sodium: 135 mmol/L (ref 135–145)

## 2021-09-03 MED ORDER — VANCOMYCIN HCL 1000 MG/200ML IV SOLN
1000.0000 mg | Freq: Two times a day (BID) | INTRAVENOUS | Status: DC
Start: 2021-09-03 — End: 2021-09-06
  Administered 2021-09-03 – 2021-09-06 (×6): 1000 mg via INTRAVENOUS
  Filled 2021-09-03 (×7): qty 200

## 2021-09-03 MED ORDER — HEPARIN SODIUM (PORCINE) 5000 UNIT/ML IJ SOLN
5000.0000 [IU] | Freq: Three times a day (TID) | INTRAMUSCULAR | Status: DC
  Administered 2021-09-04 – 2021-09-07 (×10): 5000 [IU] via SUBCUTANEOUS
  Filled 2021-09-03 (×10): qty 1

## 2021-09-03 MED ORDER — VANCOMYCIN HCL 1000 MG/200ML IV SOLN
1000.0000 mg | Freq: Two times a day (BID) | INTRAVENOUS | Status: DC
  Filled 2021-09-03 (×2): qty 200

## 2021-09-03 MED FILL — Thrombin For Soln 5000 Unit: CUTANEOUS | Qty: 5000 | Status: AC

## 2021-09-03 NOTE — Anesthesia Postprocedure Evaluation (Signed)
Anesthesia Post Note  Patient: Jimmy Rangel  Procedure(s) Performed: Sampson Goon: Anterior lateral lumbar fusion, Lumbar four- lumbar five, lumbar one- lumabr -two (Spine Lumbar) STAGE II: POSTERIOR LUMBAR INSTRUMENTATION, FUSION LUMBAR ONE- LUMBAR TWO, LUMBAR FOUR- LUMBAR FIVE, EXPLORE AND REVISION LUMBAR TWO-LUMBAR THREE, LUMBAR THREE- LUMBAR FOUR,WITH LUMBAR THREE- LUMBAR FOUR DECOMPRESSION (Spine Lumbar)     Patient location during evaluation: PACU Anesthesia Type: General Level of consciousness: awake Pain management: pain level controlled Vital Signs Assessment: post-procedure vital signs reviewed and stable Respiratory status: spontaneous breathing, nonlabored ventilation, respiratory function stable and patient connected to nasal cannula oxygen Cardiovascular status: blood pressure returned to baseline and stable Postop Assessment: no apparent nausea or vomiting Anesthetic complications: no   No notable events documented.  Last Vitals:  Vitals:   09/03/21 0344 09/03/21 0714  BP: 133/73 134/75  Pulse: 93 90  Resp: 16 16  Temp: 37.5 C 36.8 C  SpO2: 97% 97%    Last Pain:  Vitals:   09/03/21 0714  TempSrc: Oral  PainSc:                  [name] [name]

## 2021-09-03 NOTE — Evaluation (Signed)
Occupational Therapy Evaluation Patient Details Name: Jimmy Rangel MRN: [id number] DOB: 1946/08/13 Today's Date: 09/03/2021   History of Present Illness 75 y/o s/p 2 stage lumbar fusion with anterior fusion L1-2 and L4-5 and posterior fusion L1-2 and L4-5 with revision of L2-4 with L3-4 decompression on 11/23. PMH: basal cell carcinoma, back sx, R TKA 2019, sleep apnea   Clinical Impression   Patient is s/p 2 stage back surgery resulting in functional limitations due to the deficits listed below (see OT problem list). Pt currently unable to cross bil LE for LB dressing and will need further education on AE prior to d/c. Pt educated on back precautions with handout provided and return demo of back brace don doff / positioning. Pt educated on change of position needs.  Patient will benefit from skilled OT acutely to increase independence and safety with ADLS to allow discharge [place].       Recommendations for follow up therapy are one component of a multi-disciplinary discharge planning process, led by the attending physician.  Recommendations may be updated based on patient status, additional functional criteria and insurance authorization.   Follow Up Recommendations  Home health OT    Assistance Recommended at Discharge Set up Supervision/Assistance  Functional Status Assessment  Patient has had a recent decline in their functional status and demonstrates the ability to make significant improvements in function in a reasonable and predictable amount of time.  Equipment Recommendations  BSC/3in1;Other (comment) (Rw)    Recommendations for Other Services       Precautions / Restrictions Precautions Precautions: Fall;Back Precaution Booklet Issued: Yes (comment) Precaution Comments: hemovac Required Braces or Orthoses: Spinal Brace Spinal Brace: Lumbar corset;Applied in sitting position Restrictions Weight Bearing Restrictions: No      Mobility Bed Mobility Overal bed  mobility: Needs Assistance Bed Mobility: Rolling;Sidelying to Sit Rolling: Min assist Sidelying to sit: Mod assist       General bed mobility comments: with use of bed rails and increased time to complete. MinA to initiate rolling and modA to come into sitting    Transfers Overall transfer level: Needs assistance Equipment used: Rolling walker (2 wheels) Transfers: Sit to/from Stand Sit to Stand: Min assist           General transfer comment: minA to rise and steady. cues for hand placement and maintaining back precautions      Balance Overall balance assessment: Needs assistance Sitting-balance support: No upper extremity supported;Feet supported Sitting balance-Leahy Scale: Fair     Standing balance support: Bilateral upper extremity supported;Reliant on assistive device for balance Standing balance-Leahy Scale: Poor Standing balance comment: reliant on UE support and external assist                           ADL either performed or assessed with clinical judgement   ADL Overall ADL's : Needs assistance/impaired Eating/Feeding: Set up;Sitting   Grooming: Wash/dry face;Min guard;Standing       Lower Body Bathing: Maximal assistance;Sit to/from stand   Upper Body Dressing : Maximal assistance;Sitting Upper Body Dressing Details (indicate cue type and reason): don brace with education on posititioning and tightening Lower Body Dressing: Maximal assistance;Sit to/from stand               Functional mobility during ADLs: Rolling walker (2 wheels);Minimal assistance       Vision Baseline Vision/History: 1 Wears glasses       Perception     Praxis  Pertinent Vitals/Pain Pain Assessment: 0-10 Pain Score: 8  Pain Location: back Pain Descriptors / Indicators: Grimacing;Guarding;Operative site guarding Pain Intervention(s): Monitored during session     Hand Dominance Right   Extremity/Trunk Assessment Upper Extremity  Assessment Upper Extremity Assessment: Overall WFL for tasks assessed   Lower Extremity Assessment Lower Extremity Assessment: Defer to PT evaluation   Cervical / Trunk Assessment Cervical / Trunk Assessment: Back Surgery   Communication Communication Communication: No difficulties   Cognition Arousal/Alertness: Awake/alert Behavior During Therapy: WFL for tasks assessed/performed Overall Cognitive Status: Within Functional Limits for tasks assessed                                       General Comments  handout provided    Exercises     Shoulder Instructions      Home Living Family/patient expects to be discharged to:: Private residence Living Arrangements: Spouse/significant other Available Help at Discharge: Family;Available 24 hours/day Type of Home: House Home Access: Stairs to enter [employer] of Steps: 5-6 Entrance Stairs-Rails: Right Home Layout: Two level (patient doesn't go upstairs)     Bathroom Shower/Tub: Occupational psychologist: Standard     Home Equipment: Public relations account executive (2 wheels);Shower seat   Additional Comments: wife at home. 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, advertising up cars ADLs Comments: indep        OT Problem List: Decreased strength;Impaired balance (sitting and/or standing);Decreased activity tolerance;Obesity;Decreased knowledge of precautions;Decreased knowledge of use of DME or AE;Decreased safety awareness;Pain      OT Treatment/Interventions: Self-care/ADL training;Therapeutic exercise;Neuromuscular education;Energy conservation;DME and/or AE instruction;Manual therapy;Modalities;Therapeutic activities;Patient/family education;Balance training    OT Goals(Current goals can be found in the care plan section) Acute Rehab OT  Goals Patient Stated Goal: to d/c when ready friday or saturday OT Goal Formulation: With patient Time For Goal Achievement: 09/17/21 Potential to Achieve Goals: Good  OT Frequency: Min 2X/week   Barriers to D/C:            Co-evaluation              AM-PAC OT "6 Clicks" Daily Activity     Outcome Measure Help from another person eating meals?: A Little Help from another person taking care of personal grooming?: A Little Help from another person toileting, which includes using toliet, bedpan, or urinal?: A Lot Help from another person bathing (including washing, rinsing, drying)?: A Lot Help from another person to put on and taking off regular upper body clothing?: A Little Help from another person to put on and taking off regular lower body clothing?: A Lot 6 Click Score: 15   End of Session Equipment Utilized During Treatment: Rolling walker (2 wheels);Back brace Nurse Communication: Mobility status;Precautions  Activity Tolerance: Patient tolerated treatment well Patient left: in chair;with call bell/phone within reach;with chair alarm set  OT Visit Diagnosis: Unsteadiness on feet (R26.81);Muscle weakness (generalized) (M62.81);Pain                Time: 7564-3329 OT Time Calculation (min): 18 min Charges:  OT General Charges $OT Visit: 1 Visit OT Evaluation $OT Eval Moderate Complexity: 1 Mod   Brynn, OTR/L  Acute Rehabilitation Services Pager: [phone] Office: [phone] .  Jeri Modena 09/03/2021, 4:40 PM

## 2021-09-03 NOTE — Evaluation (Signed)
Physical Therapy Evaluation Patient Details Name: Jimmy Rangel MRN: [id number] DOB: 1945/12/18 Today's Date: 09/03/2021  History of Present Illness  75 y/o s/p 2 stage lumbar fusion with anterior fusion L1-2 and L4-5 and posterior fusion L1-2 and L4-5 with revision of L2-4 with L3-4 decompression on 11/23. PMH: basal cell carcinoma, back sx, R TKA 2019, sleep apnea  Clinical Impression  Patient admitted following above procedure. Patient presents with generalized weakness, impaired balance, decreased activity tolerance, and impaired functional mobility. Educated patient on back precautions and provided handout, patient verbalized understanding. Patient requires min-modA for bed mobility and minA for sit to stand transfer. Patient required minA and use of RW for short ambulation this date. Limited by fatigue. Patient will benefit from skilled PT services during acute stay to address listed deficits. Recommend HHPT at discharge to maximize functional independence, endurance, and safety in the home.      Recommendations for follow up therapy are one component of a multi-disciplinary discharge planning process, led by the attending physician.  Recommendations may be updated based on patient status, additional functional criteria and insurance authorization.  Follow Up Recommendations Home health PT    Assistance Recommended at Discharge Intermittent Supervision/Assistance  Functional Status Assessment Patient has had a recent decline in their functional status and demonstrates the ability to make significant improvements in function in a reasonable and predictable amount of time.  Equipment Recommendations  Rolling Jimmy Rangel (2 wheels)    Recommendations for Other Services       Precautions / Restrictions Precautions Precautions: Fall;Back Precaution Booklet Issued: Yes (comment) Precaution Comments: hemovac Required Braces or Orthoses: Spinal Brace Spinal Brace: Lumbar corset;Applied in  sitting position Restrictions Weight Bearing Restrictions: No      Mobility  Bed Mobility Overal bed mobility: Needs Assistance Bed Mobility: Rolling;Sidelying to Sit Rolling: Min assist Sidelying to sit: Mod assist       General bed mobility comments: with use of bed rails and increased time to complete. MinA to initiate rolling and modA to come into sitting    Transfers Overall transfer level: Needs assistance Equipment used: Rolling Jimmy Rangel (2 wheels) Transfers: Sit to/from Stand Sit to Stand: Min assist           General transfer comment: minA to rise and steady. cues for hand placement and maintaining back precautions    Ambulation/Gait Ambulation/Gait assistance: Min guard Gait Distance (Feet): 15 Feet (15' total; 10' with standing break and 5' following) Assistive device: Rolling Jimmy Rangel (2 wheels) Gait Pattern/deviations: Step-through pattern;Decreased stride length;Shuffle;Narrow base of support Gait velocity: decreased     General Gait Details: slow guarded gait but steady. Patient reports feeling weak with short bout following washing face in bathroom. Chair brought to patient.  Stairs            Wheelchair Mobility    Modified Rankin (Stroke Patients Only)       Balance Overall balance assessment: Needs assistance Sitting-balance support: No upper extremity supported;Feet supported Sitting balance-Leahy Scale: Fair     Standing balance support: Bilateral upper extremity supported;Reliant on assistive device for balance Standing balance-Leahy Scale: Poor Standing balance comment: reliant on UE support and external assist                             Pertinent Vitals/Pain Pain Assessment: 0-10 Pain Score: 8  Pain Location: back Pain Descriptors / Indicators: Grimacing;Guarding;Operative site guarding Pain Intervention(s): Monitored during session;Premedicated before session;Repositioned  Home Living Family/patient expects  to be discharged to:: Private residence Living Arrangements: Spouse/significant other Available Help at Discharge: Family;Available 24 hours/day Type of Home: House Home Access: Stairs to enter Entrance Stairs-Rails: Right Entrance Stairs-Number of Steps: 5-6   Home Layout: Two level (patient doesn't go upstairs) Home Equipment: BSC/3in1;Rolling Jimmy Rangel (2 wheels);Shower seat      Prior Function Prior Level of Function : Driving;Independent/Modified Independent             Mobility Comments: 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, 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        Extremity/Trunk Assessment   Upper Extremity Assessment Upper Extremity Assessment: Defer to OT evaluation    Lower Extremity Assessment Lower Extremity Assessment: Generalized weakness    Cervical / Trunk Assessment Cervical / Trunk Assessment: Back Surgery  Communication   Communication: No difficulties  Cognition Arousal/Alertness: Awake/alert Behavior During Therapy: WFL for tasks assessed/performed Overall Cognitive Status: Within Functional Limits for tasks assessed                                          General Comments      Exercises     Assessment/Plan    PT Assessment Patient needs continued PT services  PT Problem List Decreased strength;Decreased activity tolerance;Decreased balance;Decreased mobility;Decreased knowledge of precautions       PT Treatment Interventions DME instruction;Gait training;Stair training;Functional mobility training;Therapeutic activities;Therapeutic exercise;Balance training;Patient/family education    PT Goals (Current goals can be found in the Care Plan section)  Acute Rehab PT Goals Patient Stated Goal: to feel better PT Goal Formulation: With patient Time For Goal Achievement: 09/17/21 Potential to Achieve Goals: Good    Frequency Min 5X/week   Barriers to discharge        Co-evaluation               AM-PAC  PT "6 Clicks" Mobility  Outcome Measure Help needed turning from your back to your side while in a flat bed without using bedrails?: A Little Help needed moving from lying on your back to sitting on the side of a flat bed without using bedrails?: A Lot Help needed moving to and from a bed to a chair (including a wheelchair)?: A Little Help needed standing up from a chair using your arms (e.g., wheelchair or bedside chair)?: A Little Help needed to walk in hospital room?: A Little Help needed climbing 3-5 steps with a railing? : A Lot 6 Click Score: 16    End of Session Equipment Utilized During Treatment: Gait belt;Back brace Activity Tolerance: Patient tolerated treatment well;Patient limited by fatigue Patient left: in chair;with call bell/phone within reach;with chair alarm set Nurse Communication: Mobility status PT Visit Diagnosis: Unsteadiness on feet (R26.81);Muscle weakness (generalized) (M62.81);Difficulty in walking, not elsewhere classified (R26.2)    Time: 2409-7353 PT Time Calculation (min) (ACUTE ONLY): 49 min   Charges:   PT Evaluation $PT Eval Moderate Complexity: 1 Mod PT Treatments $Gait Training: 8-22 mins        Jimmy Rangel Rile PT, DPT Acute Rehabilitation Services Pager [phone] Office [phone]   Linna Hoff 09/03/2021, 1:23 PM

## 2021-09-03 NOTE — Progress Notes (Addendum)
Subjective: Patient reports that he is doing well overall. He has appropriate groin and lower abdominal pain. No acute events overnight.   Objective: Vital signs in last 24 hours: Temp:  [98.4 F (36.9 C)-99.5 F (37.5 C)] 99.5 F (37.5 C) (11/23 0344) Pulse Rate:  [81-99] 93 (11/23 0344) Resp:  [10-20] 16 (11/23 0344) BP: (115-166)/(73-97) 133/73 (11/23 0344) SpO2:  [94 %-100 %] 97 % (11/23 0344) Arterial Line BP: (128-151)/(70-78) 151/78 (11/22 1635)  Intake/Output from previous day: 11/22 0701 - 11/23 0700 In: 2990 [P.O.:240; I.V.:2250; IV Piggyback:500] Out: 2423 [Urine:2575; Drains:225; Blood:375] Intake/Output this shift: No intake/output data recorded.  Physical Exam: Patient is awake, A/O X 4, conversant, and in good spirits. They are in NAD and VSS. Doing well. Speech is fluent and appropriate. MAEW. 5/5 BUE. 4+/5 BLE. Sensation to light touch is intact. PERLA, EOMI. CNs grossly intact. Abdomen is soft with no tenderness to palpation. Good UO. Dressings are clean dry intact. Incisions are well approximated with no drainage, erythema, or edema. Hemovac with approximately 125 ml of sanguineous output overnight.    Lab Results: No results for input(s): WBC, HGB, HCT, PLT in the last 72 hours. BMET Recent Labs    09/03/21 0307  NA 135  K 3.9  CL 101  CO2 25  GLUCOSE 166*  BUN 11  CREATININE 0.82  CALCIUM 8.8*    Studies/Results: DG Lumbar Spine 2-3 Views  Result Date: 09/02/2021 CLINICAL DATA:  Spine surgery EXAM: LUMBAR SPINE - 2-3 VIEW COMPARISON:  CT 05/17/2021 FINDINGS: Three low resolution intraoperative spot views of the lumbar spine. Total fluoroscopy time was 5 minutes 1 second. Limited localization secondary to small field of view. Images were obtained during operative course of hardware revision and placement of left lateral plate, fixating screws, and interbody device at the lower lumbar spine. IMPRESSION: Intraoperative fluoroscopic assistance provided  during lumbar spine surgery. Electronically Signed   By: Donavan Foil M.D.   On: 09/02/2021 15:48   DG C-Arm 1-60 Min-No Report  Result Date: 09/02/2021 Fluoroscopy was utilized by the requesting physician.  No radiographic interpretation.   DG C-Arm 1-60 Min-No Report  Result Date: 09/02/2021 Fluoroscopy was utilized by the requesting physician.  No radiographic interpretation.   DG C-Arm 1-60 Min-No Report  Result Date: 09/02/2021 Fluoroscopy was utilized by the requesting physician.  No radiographic interpretation.   DG C-Arm 1-60 Min-No Report  Result Date: 09/02/2021 Fluoroscopy was utilized by the requesting physician.  No radiographic interpretation.   DG C-Arm 1-60 Min-No Report  Result Date: 09/02/2021 Fluoroscopy was utilized by the requesting physician.  No radiographic interpretation.   DG C-Arm 1-60 Min-No Report  Result Date: 09/02/2021 Fluoroscopy was utilized by the requesting physician.  No radiographic interpretation.   DG C-Arm 1-60 Min-No Report  Result Date: 09/02/2021 Fluoroscopy was utilized by the requesting physician.  No radiographic interpretation.   DG C-Arm 1-60 Min-No Report  Result Date: 09/02/2021 Fluoroscopy was utilized by the requesting physician.  No radiographic interpretation.    Assessment/Plan: Patient is post-op day 1 s/p L1-2, L4-5 XLIF with posterior decompression L3-4, rev/extension of hardware L1-4. He is recovering well and reports a significant reduction in his preoperative symptoms. He has appropriate incisional pain. Groin and lower abdominal pain which is also appropriate. No signs of peritonitis. VSS. Good UO. He is awaiting PT/OT evaluation. Continue LSO brace when OOB. Continue working on pain control, mobility and ambulating patient. Will leave Hemovac in place today.    LOS: 1 day  Marvis Moeller, DNP, NP-C 09/03/2021, 7:50 AM   Addendum:  Patient s/e, agree with above   Thank you for allowing me  to participate in this patient's care.  Please do not hesitate to call with questions or concerns.   Elwin Sleight, [facility] Cell: [phone]

## 2021-09-04 NOTE — Progress Notes (Signed)
Physical Therapy Treatment Patient Details Name: Jimmy Rangel MRN: [id number] DOB: [birth year] Today's Date: 09/04/2021   History of Present Illness 75 y/o admitted 11/22 s/Rangel 2 stage lumbar fusion with anterior fusion L1-2 and L4-5 and posterior fusion L1-2 and L4-5 with revision of L2-4 with L3-4 decompression. PMH: basal cell carcinoma, back sx, R TKA 2019, sleep apnea    PT Comments    Pt pleasant and wanting to get OOB and work toward return home. Pt with increased pain in left foot today with weakness requiring assist to lift leg in sitting with pt reporting RLE was the weak and painful leg PTA. Pt remains limited in activity tolerance and unable to progress to significant walking this session. Pt educated for precautions, brace wear and encouraged to be OOB for meals as well as increase mobility with nursing staff. Will continue to follow. Pt will need to demonstrate significantly improved mobility for return home vs option of ST-SNF.    Recommendations for follow up therapy are one component of a multi-disciplinary discharge planning process, led by the attending physician.  Recommendations may be updated based on patient status, additional functional criteria and insurance authorization.  Follow Up Recommendations  Home health PT     Assistance Recommended at Discharge Intermittent Supervision/Assistance  Equipment Recommendations  Rolling walker (2 wheels);BSC/3in1    Recommendations for Other Services       Precautions / Restrictions Precautions Precautions: Fall;Back Precaution Booklet Issued: Yes (comment) Precaution Comments: hemovac Required Braces or Orthoses: Spinal Brace Spinal Brace: Lumbar corset;Applied in sitting position     Mobility  Bed Mobility Overal bed mobility: Needs Assistance Bed Mobility: Rolling;Sidelying to Sit Rolling: Min assist Sidelying to sit: Mod assist       General bed mobility comments: cues for sequence with use of rail and pad  to roll to side with physical assist and increased time to elevate trunk from surface    Transfers Overall transfer level: Needs assistance   Transfers: Sit to/from Stand;Bed to chair/wheelchair/BSC Sit to Stand: Min assist     Step pivot transfers: Min guard     General transfer comment: cues for hand placement and assist for anterior translation and rise. Min assist to don brace in sitting. Pt able to walk 4' bed to chair but unable to progress ambulation due to pain and report of feeling like left ankle is strained    Ambulation/Gait                   Stairs             Wheelchair Mobility    Modified Rankin (Stroke Patients Only)       Balance Overall balance assessment: Needs assistance Sitting-balance support: No upper extremity supported;Feet supported Sitting balance-Leahy Scale: Fair Sitting balance - Comments: EOB without assist   Standing balance support: Bilateral upper extremity supported;Reliant on assistive device for balance Standing balance-Leahy Scale: Poor Standing balance comment: reliant on RW in standing                            Cognition Arousal/Alertness: Awake/alert Behavior During Therapy: WFL for tasks assessed/performed Overall Cognitive Status: Within Functional Limits for tasks assessed                                          Exercises General Exercises -  Lower Extremity Short Arc Quad: AROM;Both;Seated;10 reps Hip Flexion/Marching: AAROM;Right;Left;Seated;10 reps (AAROM on LLE)    General Comments        Pertinent Vitals/Pain Pain Score: 6  Pain Location: back and left foot Pain Descriptors / Indicators: Grimacing;Guarding;Operative site guarding Pain Intervention(s): Limited activity within patient's tolerance;Monitored during session;Repositioned;Premedicated before session    Home Living                          Prior Function            PT Goals (current goals  can now be found in the care plan section) Progress towards PT goals: Progressing toward goals (limited by pain)    Frequency    Min 5X/week      PT Plan Current plan remains appropriate    Co-evaluation              AM-PAC PT "6 Clicks" Mobility   Outcome Measure  Help needed turning from your back to your side while in a flat bed without using bedrails?: A Little Help needed moving from lying on your back to sitting on the side of a flat bed without using bedrails?: A Lot Help needed moving to and from a bed to a chair (including a wheelchair)?: A Little Help needed standing up from a chair using your arms (e.g., wheelchair or bedside chair)?: A Little Help needed to walk in hospital room?: A Lot Help needed climbing 3-5 steps with a railing? : Total 6 Click Score: 14    End of Session Equipment Utilized During Treatment: Back brace Activity Tolerance: Patient limited by fatigue;Patient limited by pain Patient left: in chair;with call bell/phone within reach;with chair alarm set Nurse Communication: Mobility status PT Visit Diagnosis: Muscle weakness (generalized) (M62.81);Difficulty in walking, not elsewhere classified (R26.2);Other abnormalities of gait and mobility (R26.89)     Time: 3244-0102 PT Time Calculation (min) (ACUTE ONLY): 24 min  Charges:  $Therapeutic Activity: 23-37 mins                     Jimmy Rangel, PT Acute Rehabilitation Services Pager: [phone] Office: [place] Jimmy Rangel 09/04/2021, 9:08 AM

## 2021-09-04 NOTE — Progress Notes (Signed)
   Providing Compassionate, Quality Care - Together  NEUROSURGERY PROGRESS NOTE   S: No issues overnight.  Bilateral hip pain improving.  Was able to stand at bedside yesterday.  O: EXAM:  BP 116/76 (BP Location: Right Arm)   Pulse 92   Temp 98.5 F (36.9 C) (Oral)   Resp 14   Ht 5\' 11"  (1.803 m)   Wt 92.5 kg   SpO2 97%   BMI 28.45 kg/m   Awake, alert, oriented x3 PERRL Speech fluent, appropriate  CNs grossly intact  5/5 BUE/BLE  Abdomen is soft Dressings clean dry and intact Hemovac in place  ASSESSMENT:  75 y.o. 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  Please do not hesitate to call with questions or concerns.   Elwin Sleight, [facility] Cell: [phone]

## 2021-09-05 NOTE — Progress Notes (Signed)
Occupational Therapy Treatment Patient Details Name: Jimmy Rangel MRN: [id number] DOB: Jun 20, 1946 Today's Date: 09/05/2021   History of present illness 75 y/o admitted 11/22 s/p 2 stage lumbar fusion with anterior fusion L1-2 and L4-5 and posterior fusion L1-2 and L4-5 with revision of L2-4 with L3-4 decompression. PMH: basal cell carcinoma, back sx, R TKA 2019, sleep apnea   OT comments  Pt demonstrates ability to static sit and don brace this session min guard (A) with min cues. Pt with poor recall of precautions and decreased mobility compared to baseline. Pt reports wife is bringing him a burger with wife arriving during session with no burger. Pt unable to recall information from previous day wife asking. Recommendation for hhot but question need to increase rehab if patient does not continue to progress toward goals. Pt fatigues during session and reports extreme fatigue over pain.    Recommendations for follow up therapy are one component of a multi-disciplinary discharge planning process, led by the attending physician.  Recommendations may be updated based on patient status, additional functional criteria and insurance authorization.    Follow Up Recommendations  Home health OT    Assistance Recommended at Discharge Set up Supervision/Assistance  Equipment Recommendations  BSC/3in1;Other (comment)    Recommendations for Other Services      Precautions / Restrictions Precautions Precautions: Fall;Back Precaution Booklet Issued: Yes (comment) Precaution Comments: hemavoc Required Braces or Orthoses: Spinal Brace Spinal Brace: Lumbar corset;Applied in sitting position Restrictions Weight Bearing Restrictions: No       Mobility Bed Mobility Overal bed mobility: Needs Assistance Bed Mobility: Rolling;Sidelying to Sit Rolling: Min guard Sidelying to sit: Min assist       General bed mobility comments: increased time required to complete with patient using bed functions  to lower and raise HOB. Assist for trunk.    Transfers Overall transfer level: Needs assistance Equipment used: Rolling walker (2 wheels) Transfers: Sit to/from Stand Sit to Stand: Min assist           General transfer comment: assist for powering up into standing from low surface. Patient able to scoot towards EOB with no physical assist     Balance Overall balance assessment: Needs assistance Sitting-balance support: No upper extremity supported;Feet supported Sitting balance-Leahy Scale: Fair     Standing balance support: Bilateral upper extremity supported;Reliant on assistive device for balance Standing balance-Leahy Scale: Poor Standing balance comment: reliant on RW in standing                           ADL either performed or assessed with clinical judgement   ADL Overall ADL's : Needs assistance/impaired Eating/Feeding: Set up;Sitting       Upper Body Bathing: Minimal assistance;Sitting   Lower Body Bathing: Maximal assistance;Sit to/from stand   Upper Body Dressing : Min guard;Sitting Upper Body Dressing Details (indicate cue type and reason): able to don brace with mod cues. pt taking brace over head and sliding down his back. min cues to include belly button to get it low enough. Lower Body Dressing: Maximal assistance;Sit to/from stand   Toilet Transfer: Minimal assistance;Rolling walker (2 wheels)           Functional mobility during ADLs: Minimal assistance;Rolling walker (2 wheels)      Extremity/Trunk Assessment Upper Extremity Assessment Upper Extremity Assessment: Overall WFL for tasks assessed   Lower Extremity Assessment Lower Extremity Assessment: Defer to PT evaluation        Vision  Perception     Praxis      Cognition Arousal/Alertness: Awake/alert Behavior During Therapy: WFL for tasks assessed/performed Overall Cognitive Status: Within Functional Limits for tasks assessed                                             Exercises Exercises: General Lower Extremity General Exercises - Lower Extremity Long Arc Quad: Both;5 reps;Seated Hip Flexion/Marching: Both;5 reps;Seated   Shoulder Instructions       General Comments reviewing back precautions again as pt unable to recall precautions.    Pertinent Vitals/ Pain       Pain Assessment: Faces Faces Pain Scale: Hurts even more Pain Location: L knee back Pain Descriptors / Indicators: Grimacing;Guarding;Operative site guarding Pain Intervention(s): Monitored during session;Repositioned  Home Living                                          Prior Functioning/Environment              Frequency  Min 2X/week        Progress Toward Goals  OT Goals(current goals can now be found in the care plan section)  Progress towards OT goals: Progressing toward goals  Acute Rehab OT Goals Patient Stated Goal: to d/c home OT Goal Formulation: With patient Time For Goal Achievement: 09/17/21 Potential to Achieve Goals: Good ADL Goals Pt Will Perform Grooming: with set-up;sitting Pt Will Perform Upper Body Bathing: with supervision;sitting Pt Will Perform Lower Body Bathing: with min assist;sit to/from stand;with adaptive equipment Pt Will Transfer to Toilet: with min guard assist;ambulating;regular height toilet Additional ADL Goal #1: pt will complete bed mobility supervision (A) as precursor to adls.  Plan Discharge plan remains appropriate    Co-evaluation    PT/OT/SLP Co-Evaluation/Treatment: Yes Reason for Co-Treatment: For patient/therapist safety;To address functional/ADL transfers PT goals addressed during session: Mobility/safety with mobility;Balance OT goals addressed during session: ADL's and self-care;Strengthening/ROM;Proper use of Adaptive equipment and DME      AM-PAC OT "6 Clicks" Daily Activity     Outcome Measure   Help from another person eating meals?: A Little Help  from another person taking care of personal grooming?: A Little Help from another person toileting, which includes using toliet, bedpan, or urinal?: A Little Help from another person bathing (including washing, rinsing, drying)?: A Little Help from another person to put on and taking off regular upper body clothing?: A Little Help from another person to put on and taking off regular lower body clothing?: A Lot 6 Click Score: 17    End of Session Equipment Utilized During Treatment: Rolling walker (2 wheels)  OT Visit Diagnosis: Unsteadiness on feet (R26.81);Muscle weakness (generalized) (M62.81);Pain   Activity Tolerance Patient tolerated treatment well   Patient Left in chair;with call bell/phone within reach;with family/visitor present   Nurse Communication Mobility status;Precautions        Time: 3149 (1124)-1150 OT Time Calculation (min): 26 min  Charges: OT General Charges $OT Visit: 1 Visit OT Treatments $Self Care/Home Management : 8-22 mins   Brynn, OTR/L  Acute Rehabilitation Services Pager: [phone] Office: [phone] .   Jeri Modena 09/05/2021, 2:21 PM

## 2021-09-05 NOTE — Progress Notes (Signed)
Physical Therapy Treatment Patient Details Name: Jimmy Rangel MRN: [id number] DOB: 03-24-1946 Today's Date: 09/05/2021   History of Present Illness 75 y/o admitted 11/22 s/p 2 stage lumbar fusion with anterior fusion L1-2 and L4-5 and posterior fusion L1-2 and L4-5 with revision of L2-4 with L3-4 decompression. PMH: basal cell carcinoma, back sx, R TKA 2019, sleep apnea    PT Comments    Patient able to progress ambulation distance this session. Patient ambulating 30' with min guard-minA and Rw with close chair follow. Patient able to recall back precautions. Donned brace with minA for line management. Patient reporting L knee pain with mobility. Continue to recommend HHPT at discharge to maximize functional independence and safety in the home. Will practice stairs next session.     Recommendations for follow up therapy are one component of a multi-disciplinary discharge planning process, led by the attending physician.  Recommendations may be updated based on patient status, additional functional criteria and insurance authorization.  Follow Up Recommendations  Home health PT     Assistance Recommended at Discharge Intermittent Supervision/Assistance  Equipment Recommendations  Rolling Matthews Franks (2 wheels);BSC/3in1    Recommendations for Other Services       Precautions / Restrictions Precautions Precautions: Fall;Back Precaution Booklet Issued: Yes (comment) Precaution Comments: hemovac Required Braces or Orthoses: Spinal Brace Spinal Brace: Lumbar corset;Applied in sitting position Restrictions Weight Bearing Restrictions: No     Mobility  Bed Mobility Overal bed mobility: Needs Assistance Bed Mobility: Rolling;Sidelying to Sit Rolling: Min guard Sidelying to sit: Min assist       General bed mobility comments: increased time required to complete with patient using bed functions to lower and raise HOB. Assist for trunk.    Transfers Overall transfer level: Needs  assistance Equipment used: Rolling Ardath Lepak (2 wheels) Transfers: Sit to/from Stand Sit to Stand: Min assist           General transfer comment: assist for powering up into standing from low surface. Patient able to scoot towards EOB with no physical assist    Ambulation/Gait Ambulation/Gait assistance: Min guard;Min assist Gait Distance (Feet): 30 Feet Assistive device: Rolling Mackie Goon (2 wheels) Gait Pattern/deviations: Step-through pattern;Decreased stride length;Shuffle;Narrow base of support Gait velocity: decreased     General Gait Details: minA during turns but overall min guard. Fatigues quickly. Close chair follow for safety. Patient reports L knee pain with ambulatin   Stairs             Wheelchair Mobility    Modified Rankin (Stroke Patients Only)       Balance Overall balance assessment: Needs assistance Sitting-balance support: No upper extremity supported;Feet supported Sitting balance-Leahy Scale: Fair     Standing balance support: Bilateral upper extremity supported;Reliant on assistive device for balance Standing balance-Leahy Scale: Poor Standing balance comment: reliant on RW in standing                            Cognition Arousal/Alertness: Awake/alert Behavior During Therapy: WFL for tasks assessed/performed Overall Cognitive Status: Within Functional Limits for tasks assessed                                          Exercises General Exercises - Lower Extremity Long Arc Quad: Both;5 reps;Seated Hip Flexion/Marching: Both;5 reps;Seated    General Comments        Pertinent  Vitals/Pain Pain Assessment: Faces Faces Pain Scale: Hurts even more Pain Location: L knee, back Pain Descriptors / Indicators: Grimacing;Guarding;Operative site guarding Pain Intervention(s): Monitored during session;Repositioned;Premedicated before session    Home Living                          Prior Function             PT Goals (current goals can now be found in the care plan section) Acute Rehab PT Goals Patient Stated Goal: to feel better PT Goal Formulation: With patient Time For Goal Achievement: 09/17/21 Potential to Achieve Goals: Good Progress towards PT goals: Progressing toward goals    Frequency    Min 5X/week      PT Plan Current plan remains appropriate    Co-evaluation PT/OT/SLP Co-Evaluation/Treatment: Yes Reason for Co-Treatment: For patient/therapist safety;To address functional/ADL transfers PT goals addressed during session: Mobility/safety with mobility;Balance        AM-PAC PT "6 Clicks" Mobility   Outcome Measure  Help needed turning from your back to your side while in a flat bed without using bedrails?: A Little Help needed moving from lying on your back to sitting on the side of a flat bed without using bedrails?: A Little Help needed moving to and from a bed to a chair (including a wheelchair)?: A Little Help needed standing up from a chair using your arms (e.g., wheelchair or bedside chair)?: A Little Help needed to walk in hospital room?: A Little Help needed climbing 3-5 steps with a railing? : Total 6 Click Score: 16    End of Session Equipment Utilized During Treatment: Gait belt;Back brace Activity Tolerance: Patient limited by fatigue;Patient limited by pain Patient left: in chair;with call bell/phone within reach;with chair alarm set;with family/visitor present Nurse Communication: Mobility status PT Visit Diagnosis: Muscle weakness (generalized) (M62.81);Difficulty in walking, not elsewhere classified (R26.2);Other abnormalities of gait and mobility (R26.89)     Time: 1123-1150 PT Time Calculation (min) (ACUTE ONLY): 27 min  Charges:  $Gait Training: 8-22 mins                     Drexel Ivey A. Gilford Rile PT, DPT Acute Rehabilitation Services Pager [phone] Office [phone]    Linna Hoff 09/05/2021, 1:22 PM

## 2021-09-05 NOTE — Progress Notes (Signed)
Postop day 2. Patient overall doing reasonably well.  Patient complains of incisional pain.  No radiating pain or numbness.  Mobilizing slowly with therapy.  Patient does not feel ready to consider discharge home at this point.  Awake and alert.  Oriented and appropriate.  Vital signs are stable.  He is afebrile.  Wounds are clean and dry.  Abdomen soft.  Motor and sensory function intact.    Progressing reasonably well following multilevel lumbar fusion surgery.  Continue efforts at pain control   And mobilization.Marland Kitchen

## 2021-09-06 MED ORDER — KETOROLAC TROMETHAMINE 15 MG/ML IJ SOLN
15.0000 mg | Freq: Once | INTRAMUSCULAR | Status: AC
  Administered 2021-09-06: 15 mg via INTRAVENOUS
  Filled 2021-09-06: qty 1

## 2021-09-06 NOTE — Plan of Care (Signed)

## 2021-09-06 NOTE — Progress Notes (Signed)
PT Cancellation Note  Patient Details Name: Jimmy Rangel MRN: [id number] DOB: 06/20/1946   Cancelled Treatment:    Reason Eval/Treat Not Completed: Patient declined, no reason specified Patient recently returned to bed after OT session. Patient refused further mobility this date due to neck pain. PT will re-attempt as time allows.   Wilburn Keir A. Gilford Rile PT, DPT Acute Rehabilitation Services Pager [phone] Office [phone]    Linna Hoff 09/06/2021, 3:18 PM

## 2021-09-06 NOTE — Progress Notes (Signed)
Occupational Therapy Treatment Patient Details Name: Jimmy Rangel MRN: [id number] DOB: 02-03-1946 Today's Date: 09/06/2021   History of present illness 75 y/o admitted 11/22 s/p 2 stage lumbar fusion with anterior fusion L1-2 and L4-5 and posterior fusion L1-2 and L4-5 with revision of L2-4 with L3-4 decompression. PMH: basal cell carcinoma, back sx, R TKA 2019, sleep apnea   OT comments  Pt is making slow, but steady progress toward goals.  He requires assist for LB ADLs, but wife reports she will assist him at discharge.   He requires min cues to don brace, and min guard assist for functional transfers and grooming.  He was limited today by c/o neck pain/soreness.   Wife voices concern about his ability to negotiate 4 stairs to enter  the home.      Recommendations for follow up therapy are one component of a multi-disciplinary discharge planning process, led by the attending physician.  Recommendations may be updated based on patient status, additional functional criteria and insurance authorization.    Follow Up Recommendations  Home health OT    Assistance Recommended at Discharge Set up Supervision/Assistance  Equipment Recommendations  BSC/3in1    Recommendations for Other Services      Precautions / Restrictions Precautions Precautions: Fall;Back Precaution Booklet Issued: Yes (comment) Required Braces or Orthoses: Spinal Brace Spinal Brace: Lumbar corset;Applied in sitting position       Mobility Bed Mobility Overal bed mobility: Needs Assistance Bed Mobility: Rolling;Sidelying to Sit Rolling: Min guard Sidelying to sit: Min guard       General bed mobility comments: pt requires verbal cues for log rolling    Transfers                         Balance Overall balance assessment: Needs assistance Sitting-balance support: No upper extremity supported;Feet supported Sitting balance-Leahy Scale: Fair     Standing balance support: Single extremity  supported;During functional activity Standing balance-Leahy Scale: Poor Standing balance comment: reliant on UE support                           ADL either performed or assessed with clinical judgement   ADL Overall ADL's : Needs assistance/impaired     Grooming: Wash/dry hands;Oral care;Min guard;Standing Grooming Details (indicate cue type and reason): Pt and wife instructed in safe method for brushing teeth.  Wife able to safely cue pt Upper Body Bathing: Set up;Supervision/ safety;Sitting   Lower Body Bathing: Moderate assistance;Sit to/from stand   Upper Body Dressing : Supervision/safety;Set up;Sitting Upper Body Dressing Details (indicate cue type and reason): requires min cues to don brace correctly Lower Body Dressing: Maximal assistance;Sit to/from stand Lower Body Dressing Details (indicate cue type and reason): pt unable to perform figure 4.  Wife reports she will assist pt with LB ADLs.  They report he does have a Writer: Min guard;Ambulation;Comfort height toilet;Rolling walker (2 wheels);Grab bars   Toileting- Clothing Manipulation and Hygiene: Minimal assistance;Sit to/from Nurse, children's Details (indicate cue type and reason): discussed option of using BSC/3in1 in the shower as a seat Functional mobility during ADLs: Min guard;Rolling walker (2 wheels)      Extremity/Trunk Assessment Upper Extremity Assessment Upper Extremity Assessment: Overall WFL for tasks assessed   Lower Extremity Assessment Lower Extremity Assessment: Defer to PT evaluation        Vision       Perception  Praxis      Cognition Arousal/Alertness: Awake/alert Behavior During Therapy: WFL for tasks assessed/performed Overall Cognitive Status: Within Functional Limits for tasks assessed                                            Exercises     Shoulder Instructions       General Comments wife and pt voice  concerns re: his ability to negotiate 4 stairs to enter his home    Pertinent Vitals/ Pain       Pain Assessment: Faces Faces Pain Scale: Hurts even more Pain Location: neck Pain Descriptors / Indicators: Spasm;Grimacing;Guarding Pain Intervention(s): Monitored during session;Heat applied  Home Living                                          Prior Functioning/Environment              Frequency  Min 2X/week        Progress Toward Goals  OT Goals(current goals can now be found in the care plan section)  Progress towards OT goals: Progressing toward goals     Plan Discharge plan remains appropriate    Co-evaluation                 AM-PAC OT "6 Clicks" Daily Activity     Outcome Measure   Help from another person eating meals?: None Help from another person taking care of personal grooming?: A Little Help from another person toileting, which includes using toliet, bedpan, or urinal?: A Little Help from another person bathing (including washing, rinsing, drying)?: A Little Help from another person to put on and taking off regular upper body clothing?: A Little Help from another person to put on and taking off regular lower body clothing?: A Lot 6 Click Score: 18    End of Session Equipment Utilized During Treatment: Rolling walker (2 wheels)  OT Visit Diagnosis: Pain;Unsteadiness on feet (R26.81) Pain - part of body:  (neck)   Activity Tolerance Patient tolerated treatment well   Patient Left in chair;with call bell/phone within reach;with family/visitor present   Nurse Communication Mobility status        Time: 6144-3154 OT Time Calculation (min): 24 min  Charges: OT General Charges $OT Visit: 1 Visit OT Treatments $Self Care/Home Management : 23-37 mins  Nilsa Nutting OTR/L Acute Rehabilitation Services Pager [phone] Office [phone]   Lucille Passy M 09/06/2021, 2:23 PM

## 2021-09-06 NOTE — Progress Notes (Signed)
NEUROSURGERY PROGRESS NOTE  Postop day 3 multilevel lumbar fusion. Doing ok, has a lot of back pain but ambulating well with therapy. He feels like he needs one more day of therapy to get stronger and more pain control. I believe that this is reasonable.    Temp:  [98.1 F (36.7 C)-99.3 F (37.4 C)] 98.5 F (36.9 C) (11/26 0807) Pulse Rate:  [81-95] 81 (11/26 0807) Resp:  [11-18] 11 (11/26 0807) BP: (114-138)/(68-76) 120/74 (11/26 0807) SpO2:  [90 %-97 %] 95 % (11/26 0807)    Jimmy Chiquito, NP 09/06/2021 10:13 AM

## 2021-09-07 MED ORDER — METHOCARBAMOL 500 MG PO TABS: 500.0000 mg | ORAL_TABLET | Freq: Four times a day (QID) | ORAL | 0 refills | Status: DC

## 2021-09-07 MED ORDER — OXYCODONE-ACETAMINOPHEN 7.5-325 MG PO TABS: 1.0000 | ORAL_TABLET | ORAL | 0 refills | Status: AC | PRN

## 2021-09-07 NOTE — Progress Notes (Signed)
Physical Therapy Treatment Patient Details Name: Jimmy Rangel MRN: [id number] DOB: 10-07-1946 Today's Date: 09/07/2021   History of Present Illness Pt is a 75 y/o admitted 11/22 s/p 2 stage lumbar fusion with anterior fusion L1-2 and L4-5 and posterior fusion L1-2 and L4-5 with revision of L2-4 with L3-4 decompression. PMH: basal cell carcinoma, back sx, R TKA 2019, sleep apnea    PT Comments    Pt making steady progress overall with functional mobility. He declined stair training this session but stated that he felt confident he would be able to ascend/descend the four steps to get into his home with use of the handrail. PT recommended utilizing a step-to gait pattern with use of railing and a person standing next to him for safety. Pt would continue to benefit from skilled physical therapy services at this time while admitted and after d/c to address the below listed limitations in order to improve overall safety and independence with functional mobility.    Recommendations for follow up therapy are one component of a multi-disciplinary discharge planning process, led by the attending physician.  Recommendations may be updated based on patient status, additional functional criteria and insurance authorization.  Follow Up Recommendations  Home health PT     Assistance Recommended at Discharge Intermittent Supervision/Assistance  Equipment Recommendations  Rolling walker (2 wheels);BSC/3in1    Recommendations for Other Services       Precautions / Restrictions Precautions Precautions: Fall;Back Required Braces or Orthoses: Spinal Brace Spinal Brace: Lumbar corset;Applied in sitting position Restrictions Weight Bearing Restrictions: No     Mobility  Bed Mobility Overal bed mobility: Needs Assistance Bed Mobility: Rolling;Sidelying to Sit Rolling: Min guard Sidelying to sit: Min guard       General bed mobility comments: good log roll technique utilized, use of bed rails,  supervision for safety    Transfers Overall transfer level: Needs assistance Equipment used: Rolling walker (2 wheels) Transfers: Sit to/from Stand Sit to Stand: Min guard           General transfer comment: good technique utilized, steady with transitional movement, sit<>stand x1 from EOB and x1 from bench in hallway    Ambulation/Gait Ambulation/Gait assistance: Min guard Gait Distance (Feet): 60 Feet (30' x2 with sitting rest break) Assistive device: Rolling walker (2 wheels) Gait Pattern/deviations: Step-through pattern;Decreased stride length Gait velocity: decreased     General Gait Details: pt with slow, steady and cautious gait with no over LOB or need for physical assistance   Stairs             Wheelchair Mobility    Modified Rankin (Stroke Patients Only)       Balance Overall balance assessment: Needs assistance Sitting-balance support: No upper extremity supported;Feet supported Sitting balance-Leahy Scale: Good     Standing balance support: Single extremity supported;Bilateral upper extremity supported Standing balance-Leahy Scale: Poor                              Cognition Arousal/Alertness: Awake/alert Behavior During Therapy: WFL for tasks assessed/performed Overall Cognitive Status: Within Functional Limits for tasks assessed                                          Exercises      General Comments        Pertinent Vitals/Pain Pain Assessment: No/denies  pain    Home Living                          Prior Function            PT Goals (current goals can now be found in the care plan section) Acute Rehab PT Goals PT Goal Formulation: With patient Time For Goal Achievement: 09/17/21 Potential to Achieve Goals: Good Progress towards PT goals: Progressing toward goals    Frequency    Min 5X/week      PT Plan Current plan remains appropriate    Co-evaluation               AM-PAC PT "6 Clicks" Mobility   Outcome Measure  Help needed turning from your back to your side while in a flat bed without using bedrails?: None Help needed moving from lying on your back to sitting on the side of a flat bed without using bedrails?: None Help needed moving to and from a bed to a chair (including a wheelchair)?: None Help needed standing up from a chair using your arms (e.g., wheelchair or bedside chair)?: None Help needed to walk in hospital room?: A Little Help needed climbing 3-5 steps with a railing? : A Little 6 Click Score: 22    End of Session Equipment Utilized During Treatment: Gait belt;Back brace Activity Tolerance: Patient tolerated treatment well Patient left: in chair;with call bell/phone within reach;with chair alarm set Nurse Communication: Mobility status PT Visit Diagnosis: Other abnormalities of gait and mobility (R26.89)     Time: 5956-3875 PT Time Calculation (min) (ACUTE ONLY): 19 min  Charges:  $Gait Training: 8-22 mins                     Anastasio Champion, DPT  Acute Rehabilitation Services Office [place] 09/07/2021, 9:21 AM

## 2021-09-07 NOTE — Progress Notes (Signed)
Pt with discharge orders, discharge paperwork reviewed with patient and all questions answered. IV's removed,pt taken down via wheelchair with all belongings.

## 2021-09-07 NOTE — Discharge Summary (Signed)
Physician Discharge Summary  Patient ID: Jimmy Rangel MRN: [id number] DOB/AGE: March 27, 1946 75 y.o.  Admit date: 09/02/2021 Discharge date: 09/07/2021  Admission Diagnoses: lumbar radiculopathy   Discharge Diagnoses: same   Discharged Condition: good  Hospital Course: The patient was admitted on 09/02/2021 and taken to the operating room where the patient underwent XLIF L1-2, L4-5 with posterior instrument augmentations L1-L5. The patient tolerated the procedure well and was taken to the recovery room and then to the floor in stable condition. The hospital course was routine. There were no complications. The wound remained clean dry and intact. Pt had appropriate back soreness. No complaints of leg pain or new N/T/W. The patient remained afebrile with stable vital signs, and tolerated a regular diet. The patient continued to increase activities, and pain was well controlled with oral pain medications.   Consults: None  Significant Diagnostic Studies:  Results for orders placed or performed during the hospital encounter of 32/95/18  Basic metabolic panel  Result Value Ref Range   Sodium 135 135 - 145 mmol/L   Potassium 3.9 3.5 - 5.1 mmol/L   Chloride 101 98 - 111 mmol/L   CO2 25 22 - 32 mmol/L   Glucose, Bld 166 (H) 70 - 99 mg/dL   BUN 11 8 - 23 mg/dL   Creatinine, Ser 0.82 0.61 - 1.24 mg/dL   Calcium 8.8 (L) 8.9 - 10.3 mg/dL   GFR, Estimated >60 >60 mL/min   Anion gap 9 5 - 15    DG Lumbar Spine 2-3 Views  Result Date: 09/02/2021 CLINICAL DATA:  Spine surgery EXAM: LUMBAR SPINE - 2-3 VIEW COMPARISON:  CT 05/17/2021 FINDINGS: Three low resolution intraoperative spot views of the lumbar spine. Total fluoroscopy time was 5 minutes 1 second. Limited localization secondary to small field of view. Images were obtained during operative course of hardware revision and placement of left lateral plate, fixating screws, and interbody device at the lower lumbar spine. IMPRESSION:  Intraoperative fluoroscopic assistance provided during lumbar spine surgery. Electronically Signed   By: Donavan Foil M.D.   On: 09/02/2021 15:48   DG C-Arm 1-60 Min-No Report  Result Date: 09/02/2021 Fluoroscopy was utilized by the requesting physician.  No radiographic interpretation.   DG C-Arm 1-60 Min-No Report  Result Date: 09/02/2021 Fluoroscopy was utilized by the requesting physician.  No radiographic interpretation.   DG C-Arm 1-60 Min-No Report  Result Date: 09/02/2021 Fluoroscopy was utilized by the requesting physician.  No radiographic interpretation.   DG C-Arm 1-60 Min-No Report  Result Date: 09/02/2021 Fluoroscopy was utilized by the requesting physician.  No radiographic interpretation.   DG C-Arm 1-60 Min-No Report  Result Date: 09/02/2021 Fluoroscopy was utilized by the requesting physician.  No radiographic interpretation.   DG C-Arm 1-60 Min-No Report  Result Date: 09/02/2021 Fluoroscopy was utilized by the requesting physician.  No radiographic interpretation.   DG C-Arm 1-60 Min-No Report  Result Date: 09/02/2021 Fluoroscopy was utilized by the requesting physician.  No radiographic interpretation.   DG C-Arm 1-60 Min-No Report  Result Date: 09/02/2021 Fluoroscopy was utilized by the requesting physician.  No radiographic interpretation.    Antibiotics:  Anti-infectives (From admission, onward)    Start     Dose/Rate Route Frequency Ordered Stop   09/03/21 1830  vancomycin (VANCOREADY) IVPB 1000 mg/200 mL  Status:  Discontinued        1,000 mg 200 mL/hr over 60 Minutes Intravenous Every 12 hours 09/03/21 1723 09/06/21 1234   09/03/21 1400  vancomycin (VANCOREADY)  IVPB 1000 mg/200 mL  Status:  Discontinued        1,000 mg 200 mL/hr over 60 Minutes Intravenous Every 12 hours 09/03/21 1342 09/03/21 1723   09/02/21 2200  vancomycin (VANCOREADY) IVPB 1000 mg/200 mL  Status:  Discontinued        1,000 mg 200 mL/hr over 60 Minutes Intravenous  Every 12 hours 09/02/21 2111 09/03/21 1342   09/02/21 0600  vancomycin (VANCOCIN) IVPB 1000 mg/200 mL premix        1,000 mg 200 mL/hr over 60 Minutes Intravenous On call to O.R. 09/02/21 0553 09/02/21 0727       Discharge Exam: Blood pressure 124/74, pulse 90, temperature 97.6 F (36.4 C), temperature source Oral, resp. rate 18, height 5\' 11"  (1.803 m), weight 92.5 kg, SpO2 99 %. Neurologic: Grossly normal Ambulating and voiding well, incision cdi  Discharge Medications:   Allergies as of 09/07/2021       Reactions   Atorvastatin    Myalgia   Penicillins Other (See Comments)   Passed out Has patient had a PCN reaction causing immediate rash, facial/tongue/throat swelling, SOB or lightheadedness with hypotension: Yes Has patient had a PCN reaction causing severe rash involving mucus membranes or skin necrosis: No Has patient had a PCN reaction that required hospitalization: No Has patient had a PCN reaction occurring within the last 10 years: No If all of the above answers are "NO", then may proceed with Cephalosporin use.        Medication List     TAKE these medications    diclofenac 75 MG EC tablet Commonly known as: VOLTAREN Take 75 mg by mouth daily.   docusate sodium 100 MG capsule Commonly known as: COLACE 1 tab 2 times a day while on narcotics.  STOOL SOFTENER   fluticasone 50 MCG/ACT nasal spray Commonly known as: FLONASE Place 2 sprays into both nostrils daily as needed for allergies or rhinitis.   GLUCOSAMINE-CHONDROITIN PO Take 1,500 mg by mouth daily.   methocarbamol 500 MG tablet Commonly known as: Robaxin Take 1 tablet (500 mg total) by mouth 4 (four) times daily.   multivitamin with minerals Tabs tablet Take 1 tablet by mouth daily. Package from [facility]   Omega-3 1000 MG Caps Take 1,000 mg by mouth daily.   omeprazole 10 MG capsule Commonly known as: PRILOSEC Take 10 mg by mouth daily.   OVER THE COUNTER MEDICATION Apply 1 application  topically daily. Hemp cream   oxyCODONE-acetaminophen 7.5-325 MG tablet Commonly known as: Percocet Take 1 tablet by mouth every 4 (four) hours as needed for severe pain.   REFRESH TEARS OP Place 1 drop into both eyes daily as needed (Dry eyes).   rosuvastatin 10 MG tablet Commonly known as: CRESTOR Take 10 mg by mouth at bedtime.   tamsulosin 0.4 MG Caps capsule Commonly known as: FLOMAX Take 0.4 mg by mouth at bedtime.        Disposition: home  Final Dx: XLIF L1-2, L4-5 with posterior instrument augmentations L1-L5  Discharge Instructions     Call MD for:  difficulty breathing, headache or visual disturbances   Complete by: As directed    Call MD for:  hives   Complete by: As directed    Call MD for:  persistant nausea and vomiting   Complete by: As directed    Call MD for:  redness, tenderness, or signs of infection (pain, swelling, redness, odor or green/yellow discharge around incision site)   Complete by: As directed  Call MD for:  severe uncontrolled pain   Complete by: As directed    Call MD for:  temperature >100.4   Complete by: As directed    Diet - low sodium heart healthy   Complete by: As directed    Driving Restrictions   Complete by: As directed    No driving for 2 weeks, no riding in the car for 1 week   Incentive spirometry RT   Complete by: As directed    Increase activity slowly   Complete by: As directed    Lifting restrictions   Complete by: As directed    No lifting more than 8 lbs   Remove dressing in 24 hours   Complete by: As directed         Follow-up Information     [facility] Follow up.   Specialty: [place] Why: (go by [facility] now)- they will follow for [facility] needs Contact information: [address] [place] [address] [phone]                  Signed: Ocie Cornfield [facility] 09/07/2021, 12:45 PM

## 2021-09-19 ENCOUNTER — Encounter (HOSPITAL_COMMUNITY): Payer: Self-pay

## 2021-09-19 ENCOUNTER — Ambulatory Visit (HOSPITAL_COMMUNITY): Admission: RE | Admit: 2021-09-19 | Payer: Medicare Other | Source: Ambulatory Visit

## 2021-09-19 ENCOUNTER — Other Ambulatory Visit (HOSPITAL_COMMUNITY): Payer: Self-pay | Admitting: Neurological Surgery

## 2021-09-19 ENCOUNTER — Ambulatory Visit (HOSPITAL_COMMUNITY)
Admission: RE | Admit: 2021-09-19 | Discharge: 2021-09-19 | Disposition: A | Discharge status: Home / Self Care | Payer: Medicare Other | Source: Ambulatory Visit | Attending: Neurological Surgery | Admitting: Neurological Surgery

## 2021-09-19 ENCOUNTER — Other Ambulatory Visit: Payer: Self-pay

## 2021-09-19 DIAGNOSIS — R609 Edema, unspecified: Secondary | ICD-10-CM

## 2021-09-19 DIAGNOSIS — M7989 Other specified soft tissue disorders: Secondary | ICD-10-CM | POA: Insufficient documentation

## 2021-10-22 ENCOUNTER — Other Ambulatory Visit: Payer: Self-pay

## 2021-10-22 ENCOUNTER — Ambulatory Visit: Payer: Medicare Other | Admitting: Dermatology

## 2021-10-22 DIAGNOSIS — Z1283 Encounter for screening for malignant neoplasm of skin: Secondary | ICD-10-CM

## 2021-10-22 DIAGNOSIS — D492 Neoplasm of unspecified behavior of bone, soft tissue, and skin: Secondary | ICD-10-CM

## 2021-10-22 DIAGNOSIS — Z85828 Personal history of other malignant neoplasm of skin: Secondary | ICD-10-CM | POA: Diagnosis not present

## 2021-10-22 DIAGNOSIS — L578 Other skin changes due to chronic exposure to nonionizing radiation: Secondary | ICD-10-CM | POA: Diagnosis not present

## 2021-10-22 DIAGNOSIS — L821 Other seborrheic keratosis: Secondary | ICD-10-CM

## 2021-10-22 DIAGNOSIS — D229 Melanocytic nevi, unspecified: Secondary | ICD-10-CM

## 2021-10-22 DIAGNOSIS — Z872 Personal history of diseases of the skin and subcutaneous tissue: Secondary | ICD-10-CM | POA: Diagnosis not present

## 2021-10-22 DIAGNOSIS — C4441 Basal cell carcinoma of skin of scalp and neck: Secondary | ICD-10-CM

## 2021-10-22 DIAGNOSIS — L570 Actinic keratosis: Secondary | ICD-10-CM

## 2021-10-22 DIAGNOSIS — D1800 Hemangioma unspecified site: Secondary | ICD-10-CM

## 2021-10-22 DIAGNOSIS — L814 Other melanin hyperpigmentation: Secondary | ICD-10-CM

## 2021-10-22 HISTORY — DX: Actinic keratosis: L57.0

## 2021-10-22 NOTE — Progress Notes (Signed)
Follow-Up Visit   Subjective  Jimmy Rangel is a 76 y.o. male who presents for the following: Annual Exam (Hx of BCC's. Check spot on left neck. Bothersome, raised, irritated, itching. Patient c/o scaly growth on scalp that is bothersome. Frozen in past.).  The patient presents for Upper Body Skin Exam (UBSE) for skin cancer screening and mole check.  The patient has spots, moles and lesions to be evaluated, some may be new or changing and the patient has concerns that these could be cancer.   The following portions of the chart were reviewed this encounter and updated as appropriate:      Review of Systems: No other skin or systemic complaints except as noted in HPI or Assessment and Plan.   Objective  Well appearing patient in no apparent distress; mood and affect are within normal limits.  All skin waist up examined.  Left lower neck 7mm pink crusted papule     vertex scalp 57mm keratotic papule     Right Preauricular Cheek x3, Left Preauricular x2, Upper forehead x3 (8) Pink scaly papules  Right Malar Cheek and Left central upper abdomen Clear today   Assessment & Plan  Neoplasm of skin (2) Left lower neck  Skin / nail biopsy Type of biopsy: tangential   Informed consent: discussed and consent obtained   Anesthesia: the lesion was anesthetized in a standard fashion   Anesthesia comment:  Area prepped with alcohol Anesthetic:  1% lidocaine w/ epinephrine 1-100,000 buffered w/ 8.4% NaHCO3 Instrument used: flexible razor blade   Hemostasis achieved with: pressure, aluminum chloride and electrodesiccation   Outcome: patient tolerated procedure well    Destruction of lesion  Destruction method: electrodesiccation and curettage   Informed consent: discussed and consent obtained   Curettage performed in three different directions: Yes   Electrodesiccation performed over the curetted area: Yes   Final wound size (cm):  1 Hemostasis achieved with:  pressure,  aluminum chloride and electrodesiccation Outcome: patient tolerated procedure well with no complications   Post-procedure details: wound care instructions given   Additional details:  Mupirocin ointment and Bandaid applied    Specimen 1 - Surgical pathology Differential Diagnosis: ISK R/O BCC  Check Margins: No  vertex scalp  Skin / nail biopsy Type of biopsy: tangential   Informed consent: discussed and consent obtained   Anesthesia: the lesion was anesthetized in a standard fashion   Anesthesia comment:  Area prepped with alcohol Anesthetic:  1% lidocaine w/ epinephrine 1-100,000 buffered w/ 8.4% NaHCO3 Instrument used: flexible razor blade   Hemostasis achieved with: pressure, aluminum chloride and electrodesiccation   Outcome: patient tolerated procedure well    Destruction of lesion  Destruction method: electrodesiccation and curettage   Informed consent: discussed and consent obtained   Curettage performed in three different directions: Yes   Electrodesiccation performed over the curetted area: Yes   Final wound size (cm):  1.3 Hemostasis achieved with:  pressure, aluminum chloride and electrodesiccation Outcome: patient tolerated procedure well with no complications   Post-procedure details: wound care instructions given   Additional details:  Mupirocin ointment and Bandaid applied    Specimen 2 - Surgical pathology Differential Diagnosis: Hypertrophic AK vs ISK,  R/O SCC  Check Margins: No  AK (actinic keratosis) (8) Right Preauricular Cheek x3, Left Preauricular x2, Upper forehead x3  Actinic keratoses are precancerous spots that appear secondary to cumulative UV radiation exposure/sun exposure over time. They are chronic with expected duration over 1 year. A portion of  actinic keratoses will progress to squamous cell carcinoma of the skin. It is not possible to reliably predict which spots will progress to skin cancer and so treatment is recommended to prevent  development of skin cancer.  Recommend daily broad spectrum sunscreen SPF 30+ to sun-exposed areas, reapply every 2 hours as needed.  Recommend staying in the shade or wearing long sleeves, sun glasses (UVA+UVB protection) and wide brim hats (4-inch brim around the entire circumference of the hat). Call for new or changing lesions.  Discussed PDT, Pt may schedule photodynamic therapy to the face x 2   Destruction of lesion - Right Preauricular Cheek x3, Left Preauricular x2, Upper forehead x3  Destruction method: cryotherapy   Informed consent: discussed and consent obtained   Lesion destroyed using liquid nitrogen: Yes   Region frozen until ice ball extended beyond lesion: Yes   Outcome: patient tolerated procedure well with no complications   Post-procedure details: wound care instructions given   Additional details:  Prior to procedure, discussed risks of blister formation, small wound, skin dyspigmentation, or rare scar following cryotherapy. Recommend Vaseline ointment to treated areas while healing.   History of actinic keratoses Right Malar Cheek and Left central upper abdomen  Observe    Lentigines - Scattered tan macules - Due to sun exposure - Benign-appearing, observe - Recommend daily broad spectrum sunscreen SPF 30+ to sun-exposed areas, reapply every 2 hours as needed. - Call for any changes  Seborrheic Keratoses - Stuck-on, waxy, tan-brown papules and/or plaques  - Benign-appearing - Discussed benign etiology and prognosis. - Observe - Call for any changes  Melanocytic Nevi - Tan-brown and/or pink-flesh-colored symmetric macules and papules - Benign appearing on exam today - Observation - Call clinic for new or changing moles - Recommend daily use of broad spectrum spf 30+ sunscreen to sun-exposed areas.   Hemangiomas - Red papules - Discussed benign nature - Observe - Call for any changes  Actinic Damage - Severe, confluent actinic changes with  pre-cancerous actinic keratoses  - Severe, chronic, not at goal, secondary to cumulative UV radiation exposure over time - diffuse scaly erythematous macules and papules with underlying dyspigmentation - Discussed Prescription "Field Treatment" for Severe, Chronic Confluent Actinic Changes with Pre-Cancerous Actinic Keratoses Field treatment involves treatment of an entire area of skin that has confluent Actinic Changes (Sun/ Ultraviolet light damage) and PreCancerous Actinic Keratoses by method of PhotoDynamic Therapy (PDT) and/or prescription Topical Chemotherapy agents such as 5-fluorouracil, 5-fluorouracil/calcipotriene, and/or imiquimod.  The purpose is to decrease the number of clinically evident and subclinical PreCancerous lesions to prevent progression to development of skin cancer by chemically destroying early precancer changes that may or may not be visible.  It has been shown to reduce the risk of developing skin cancer in the treated area. As a result of treatment, redness, scaling, crusting, and open sores may occur during treatment course. One or more than one of these methods may be used and may have to be used several times to control, suppress and eliminate the PreCancerous changes. Discussed treatment course, expected reaction, and possible side effects. - Recommend daily broad spectrum sunscreen SPF 30+ to sun-exposed areas, reapply every 2 hours as needed.  - Staying in the shade or wearing long sleeves, sun glasses (UVA+UVB protection) and wide brim hats (4-inch brim around the entire circumference of the hat) are also recommended. - Call for new or changing lesions.  -Pt may schedule photodynamic therapy to the face x 2  Skin cancer screening performed  today.  History of Basal Cell Carcinoma of the Skin - No evidence of recurrence today at left mid sternum, right upper NL, right posterior neck. - Recommend regular full body skin exams - Recommend daily broad spectrum sunscreen  SPF 30+ to sun-exposed areas, reapply every 2 hours as needed.  - Call if any new or changing lesions are noted between office visits     Return for PDT next available (Face); 6 months UBSE.  I, Emelia Salisbury, CMA, am acting as scribe for [name], MD.  Documentation: I have reviewed the above documentation for accuracy and completeness, and I agree with the above.  [name] MD

## 2021-10-22 NOTE — Patient Instructions (Addendum)
Wound Care Instructions ? ?Cleanse wound gently with soap and water once a day then pat dry with clean gauze. Apply a thing coat of Petrolatum (petroleum jelly, "Vaseline") over the wound (unless you have an allergy to this). We recommend that you use a new, sterile tube of Vaseline. Do not pick or remove scabs. Do not remove the yellow or white "healing tissue" from the base of the wound. ? ?Cover the wound with fresh, clean, nonstick gauze and secure with paper tape. You may use Band-Aids in place of gauze and tape if the would is small enough, but would recommend trimming much of the tape off as there is often too much. Sometimes Band-Aids can irritate the skin. ? ?You should call the office for your biopsy report after 1 week if you have not already been contacted. ? ?If you experience any problems, such as abnormal amounts of bleeding, swelling, significant bruising, significant pain, or evidence of infection, please call the office immediately. ? ?FOR ADULT SURGERY PATIENTS: If you need something for pain relief you may take 1 extra strength Tylenol (acetaminophen) AND 2 Ibuprofen (200mg each) together every 4 hours as needed for pain. (do not take these if you are allergic to them or if you have a reason you should not take them.) Typically, you may only need pain medication for 1 to 3 days.  ? ?Cryotherapy Aftercare ? ?Wash gently with soap and water everyday.   ?Apply Vaseline and Band-Aid daily until healed.  ? ?Prior to procedure, discussed risks of blister formation, small wound, skin dyspigmentation, or rare scar following cryotherapy. Recommend Vaseline ointment to treated areas while healing.  ? ?If You Need Anything After Your Visit ? ?If you have any questions or concerns for your doctor, please call our main line at [phone] and press option 4 to reach your doctor's medical assistant. If no one answers, please leave a voicemail as directed and we will return your call as soon as possible.  Messages left after 4 pm will be answered the following business day.  ? ?You may also send us a message via MyChart. We typically respond to MyChart messages within 1-2 business days. ? ?For prescription refills, please ask your pharmacy to contact our office. Our fax number is [phone]. ? ?If you have an urgent issue when the clinic is closed that cannot wait until the next business day, you can page your doctor at the number below.   ? ?Please note that while we do our best to be available for urgent issues outside of office hours, we are not available 24/7.  ? ?If you have an urgent issue and are unable to reach us, you may choose to seek medical care at your doctor's office, retail clinic, urgent care center, or emergency room. ? ?If you have a medical emergency, please immediately call 911 or go to the emergency department. ? ?Pager Numbers ? ?- Dr. Kowalski: [phone] ? ?- Dr. Moye: [phone] ? ?- Dr. Stewart: [phone] ? ?In the event of inclement weather, please call our main line at [phone] for an update on the status of any delays or closures. ? ?Dermatology Medication Tips: ?Please keep the boxes that topical medications come in in order to help keep track of the instructions about where and how to use these. Pharmacies typically print the medication instructions only on the boxes and not directly on the medication tubes.  ? ?If your medication is too expensive, please contact our office at [phone] option 4   or send us a message through MyChart.  ? ?We are unable to tell what your co-pay for medications will be in advance as this is different depending on your insurance coverage. However, we may be able to find a substitute medication at lower cost or fill out paperwork to get insurance to cover a needed medication.  ? ?If a prior authorization is required to get your medication covered by your insurance company, please allow us 1-2 business days to complete this process. ? ?Drug  prices often vary depending on where the prescription is filled and some pharmacies may offer cheaper prices. ? ?The website www.goodrx.com contains coupons for medications through different pharmacies. The prices here do not account for what the cost may be with help from insurance (it may be cheaper with your insurance), but the website can give you the price if you did not use any insurance.  ?- You can print the associated coupon and take it with your prescription to the pharmacy.  ?- You may also stop by our office during regular business hours and pick up a GoodRx coupon card.  ?- If you need your prescription sent electronically to a different pharmacy, notify our office through [facility] MyChart or by phone at [phone] option 4. ? ? ? ? ?Si Usted Necesita Algo Despu?s de Su Visita ? ?Tambi?n puede enviarnos un mensaje a trav?s de MyChart. Por lo general respondemos a los mensajes de MyChart en el transcurso de 1 a 2 d?as h?biles. ? ?Para renovar recetas, por favor pida a su farmacia que se ponga en contacto con nuestra oficina. Nuestro n?mero de fax es el [phone]. ? ?Si tiene un asunto urgente cuando la cl?nica est? cerrada y que no puede esperar hasta el siguiente d?a h?bil, puede llamar/localizar a su doctor(a) al n?mero que aparece a continuaci?n.  ? ?Por favor, tenga en cuenta que aunque hacemos todo lo posible para estar disponibles para asuntos urgentes fuera del horario de oficina, no estamos disponibles las 24 horas del d?a, los 7 d?as de la semana.  ? ?Si tiene un problema urgente y no puede comunicarse con nosotros, puede optar por buscar atenci?n m?dica  en el consultorio de su doctor(a), en una cl?nica privada, en un centro de atenci?n urgente o en una sala de emergencias. ? ?Si tiene una emergencia m?dica, por favor llame inmediatamente al 911 o vaya a la sala de emergencias. ? ?N?meros de b?per ? ?- Dr. Kowalski: [phone] ? ?- Dra. Moye: [phone] ? ?- Dra. Stewart:  [phone] ? ?En caso de inclemencias del tiempo, por favor llame a nuestra l?nea principal al [phone] para una actualizaci?n sobre el estado de cualquier retraso o cierre. ? ?Consejos para la medicaci?n en dermatolog?a: ?Por favor, guarde las cajas en las que vienen los medicamentos de uso t?pico para ayudarle a seguir las instrucciones sobre d?nde y c?mo usarlos. Las farmacias generalmente imprimen las instrucciones del medicamento s?lo en las cajas y no directamente en los tubos del medicamento.  ? ?Si su medicamento es muy caro, por favor, p?ngase en contacto con nuestra oficina llamando al [phone] y presione la opci?n 4 o env?enos un mensaje a trav?s de MyChart.  ? ?No podemos decirle cu?l ser? su copago por los medicamentos por adelantado ya que esto es diferente dependiendo de la cobertura de su seguro. Sin embargo, es posible que podamos encontrar un medicamento sustituto a menor costo o llenar un formulario para que el seguro cubra el medicamento que se considera necesario.  ? ?  Si se requiere una autorizaci?n previa para que su compa??a de seguros cubra su medicamento, por favor perm?tanos de 1 a 2 d?as h?biles para completar este proceso. ? ?Los precios de los medicamentos var?an con frecuencia dependiendo del lugar de d?nde se surte la receta y alguna farmacias pueden ofrecer precios m?s baratos. ? ?El sitio web www.goodrx.com tiene cupones para medicamentos de diferentes farmacias. Los precios aqu? no tienen en cuenta lo que podr?a costar con la ayuda del seguro (puede ser m?s barato con su seguro), pero el sitio web puede darle el precio si no utiliz? ning?n seguro.  ?- Puede imprimir el cup?n correspondiente y llevarlo con su receta a la farmacia.  ?- Tambi?n puede pasar por nuestra oficina durante el horario de atenci?n regular y recoger una tarjeta de cupones de GoodRx.  ?- Si necesita que su receta se env?e electr?nicamente a una farmacia diferente, informe a nuestra oficina a trav?s de  MyChart de [facility] o por tel?fono llamando al [phone] y presione la opci?n 4.  ?

## 2021-10-27 ENCOUNTER — Telehealth: Payer: Self-pay

## 2021-10-27 NOTE — Telephone Encounter (Signed)
Advised patient biopsy of the left lower neck was BCC and vertex scalp was hypertrophic AK. Both treated with EDC at time of biopsy. No further treatment needed at this time. Will recheck on follow-up appt.

## 2021-10-27 NOTE — Telephone Encounter (Signed)
-----   Message from [name], MD sent at 10/27/2021 10:36 AM EST ----- 1. Skin , left lower neck BASAL CELL CARCINOMA, NODULAR AND INFILTRATIVE PATTERNS, BASE INVOLVED 2. Skin , vertex scalp HYPERTROPHIC ACTINIC KERATOSIS  1. BCC skin cancer- already treated with EDC at time of biopsy 2. Thick precancer- treated with EDC at time of biopsy   - please call patient

## 2022-01-05 ENCOUNTER — Ambulatory Visit: Payer: Medicare Other | Admitting: Dermatology

## 2022-02-05 ENCOUNTER — Emergency Department (HOSPITAL_COMMUNITY): Payer: Medicare Other

## 2022-02-05 ENCOUNTER — Emergency Department (HOSPITAL_COMMUNITY)
Admission: EM | Admit: 2022-02-05 | Discharge: 2022-02-05 | Disposition: A | Discharge status: Home / Self Care | Payer: Medicare Other | Attending: Emergency Medicine | Admitting: Emergency Medicine

## 2022-02-05 ENCOUNTER — Encounter (HOSPITAL_COMMUNITY): Payer: Self-pay

## 2022-02-05 ENCOUNTER — Other Ambulatory Visit: Payer: Self-pay

## 2022-02-05 DIAGNOSIS — R079 Chest pain, unspecified: Secondary | ICD-10-CM | POA: Diagnosis not present

## 2022-02-05 DIAGNOSIS — E785 Hyperlipidemia, unspecified: Secondary | ICD-10-CM | POA: Insufficient documentation

## 2022-02-05 DIAGNOSIS — I444 Left anterior fascicular block: Secondary | ICD-10-CM | POA: Insufficient documentation

## 2022-02-05 DIAGNOSIS — K219 Gastro-esophageal reflux disease without esophagitis: Secondary | ICD-10-CM | POA: Diagnosis not present

## 2022-02-05 DIAGNOSIS — R61 Generalized hyperhidrosis: Secondary | ICD-10-CM | POA: Insufficient documentation

## 2022-02-05 DIAGNOSIS — R931 Abnormal findings on diagnostic imaging of heart and coronary circulation: Secondary | ICD-10-CM | POA: Insufficient documentation

## 2022-02-05 DIAGNOSIS — E7800 Pure hypercholesterolemia, unspecified: Secondary | ICD-10-CM

## 2022-02-05 DIAGNOSIS — I4510 Unspecified right bundle-branch block: Secondary | ICD-10-CM | POA: Diagnosis not present

## 2022-02-05 DIAGNOSIS — G4733 Obstructive sleep apnea (adult) (pediatric): Secondary | ICD-10-CM | POA: Insufficient documentation

## 2022-02-05 DIAGNOSIS — Z7982 Long term (current) use of aspirin: Secondary | ICD-10-CM | POA: Insufficient documentation

## 2022-02-05 DIAGNOSIS — R072 Precordial pain: Secondary | ICD-10-CM

## 2022-02-05 DIAGNOSIS — I2510 Atherosclerotic heart disease of native coronary artery without angina pectoris: Secondary | ICD-10-CM | POA: Diagnosis not present

## 2022-02-05 LAB — BASIC METABOLIC PANEL
Anion gap: 10 (ref 5–15)
BUN: 14 mg/dL (ref 8–23)
CO2: 26 mmol/L (ref 22–32)
Calcium: 9.5 mg/dL (ref 8.9–10.3)
Chloride: 103 mmol/L (ref 98–111)
Creatinine, Ser: 0.98 mg/dL (ref 0.61–1.24)
GFR, Estimated: >60 mL/min (ref >60)
Glucose, Bld: 121 mg/dL — ABNORMAL HIGH (ref 70–99)
Potassium: 4.2 mmol/L (ref 3.5–5.1)
Sodium: 139 mmol/L (ref 135–145)

## 2022-02-05 LAB — CBC
HCT: 41.5 % (ref 39.0–52.0)
Hemoglobin: 13.4 g/dL (ref 13.0–17.0)
MCH: 30.7 pg (ref 26.0–34.0)
MCHC: 32.3 g/dL (ref 30.0–36.0)
MCV: 95 fL (ref 80.0–100.0)
Platelets: 436 10*3/uL — ABNORMAL HIGH (ref 150–400)
RBC: 4.37 MIL/uL (ref 4.22–5.81)
RDW: 14.6 % (ref 11.5–15.5)
WBC: 7.9 10*3/uL (ref 4.0–10.5)
nRBC: 0 % (ref 0.0–0.2)

## 2022-02-05 LAB — LIPID PANEL
Cholesterol: 124 mg/dL (ref 0–200)
HDL: 34 mg/dL — ABNORMAL LOW (ref >40)
LDL Cholesterol: 55 mg/dL (ref 0–99)
Total CHOL/HDL Ratio: 3.6 RATIO
Triglycerides: 177 mg/dL — ABNORMAL HIGH (ref <150)
VLDL: 35 mg/dL (ref 0–40)

## 2022-02-05 LAB — TROPONIN I (HIGH SENSITIVITY)
Troponin I (High Sensitivity): 3 ng/L (ref <18)
Troponin I (High Sensitivity): 4 ng/L (ref <18)

## 2022-02-05 IMAGING — DX DG CHEST 2V
2 series · 2 of 2 positions shown · non-contrast
Comparison: None.

CLINICAL DATA: Chest pain and left shoulder pain

EXAM:
CHEST - 2 VIEW

[chest pa]
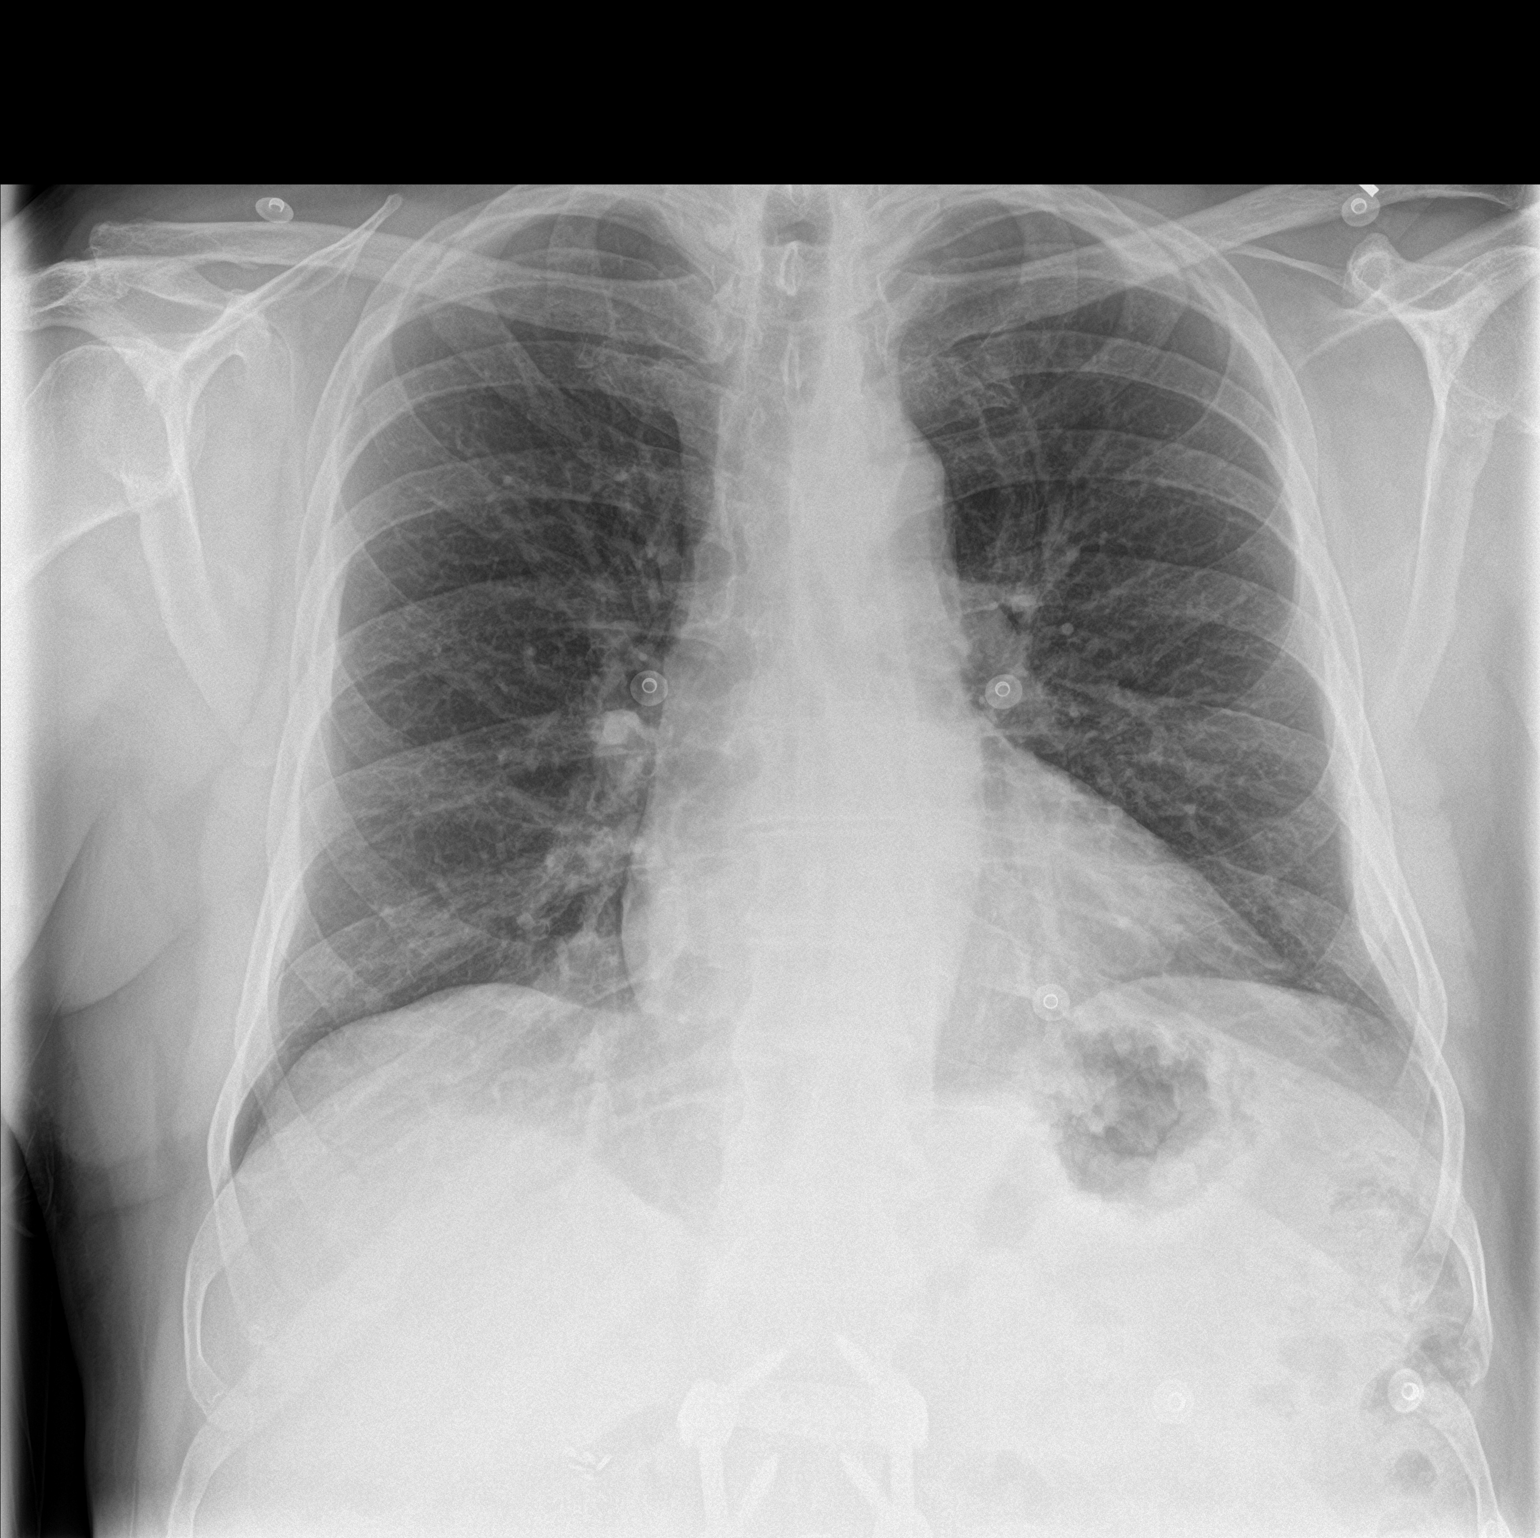

[chest lat]
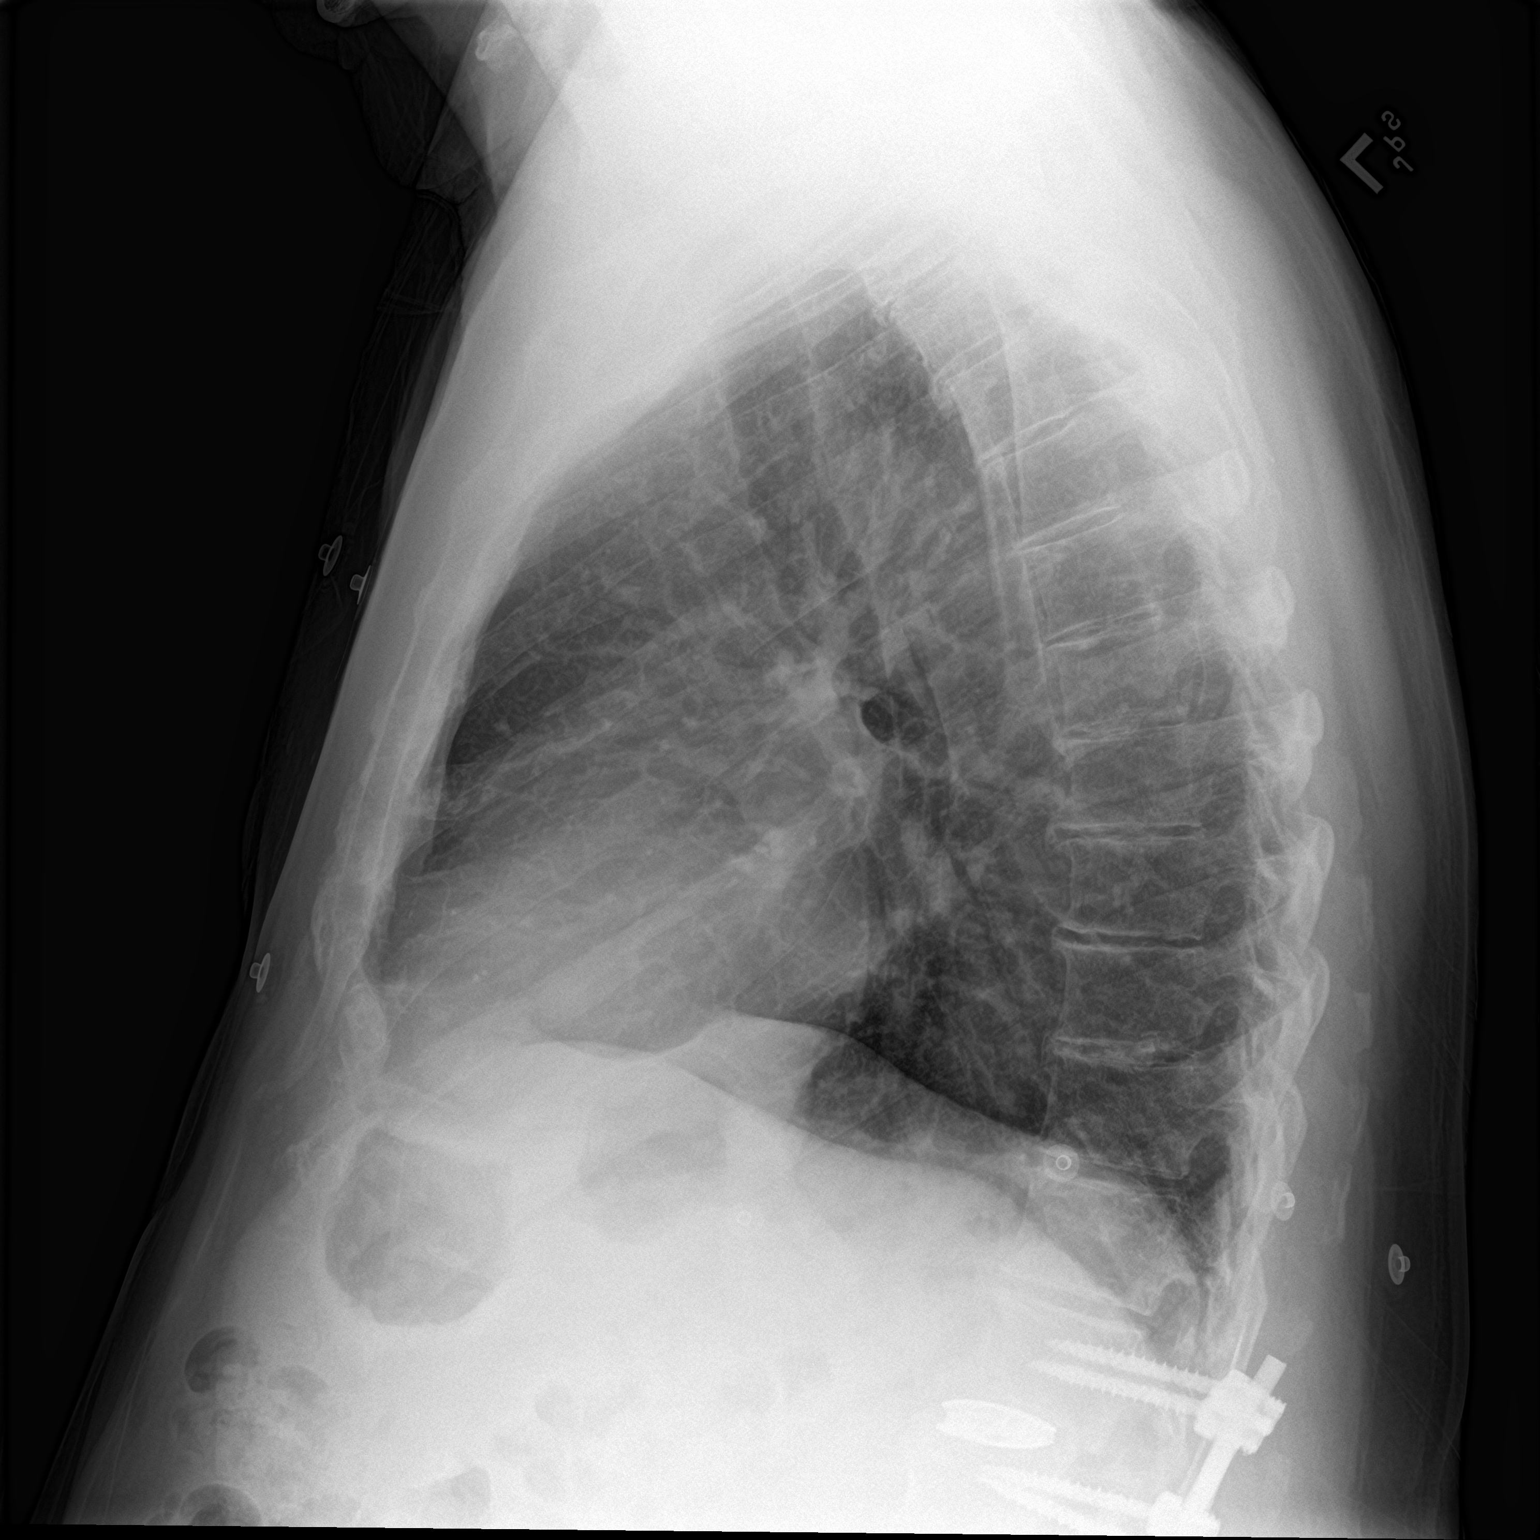

[2 of 2 positions shown; findings below may reference images not displayed]

FINDINGS: The heart size and mediastinal contours are within normal limits.
Both lungs are clear. The visualized skeletal structures are
unremarkable.
IMPRESSION: No active cardiopulmonary disease.

## 2022-02-05 IMAGING — CT CT HEART MORP W/ CTA COR W/ SCORE W/ CA W/CM &/OR W/O CM
4 of 7 series · 8 of 20 positions shown, 9 images · IV contrast (APPLIED)
Comparison: Today's chest radiograph. No prior CT.
COMPARISON: Today's chest radiograph. No prior CT.

Addendum:
EXAM:
OVER-READ INTERPRETATION  CT CHEST

The following report is an over-read performed by radiologist Dr.
YOSHIDA [REDACTED] on [DATE]. This over-read
does not include interpretation of cardiac or coronary anatomy or
pathology. The coronary CTA interpretation by the cardiologist is
attached.
HISTORY: Chest pain, nonspecific
Cardiac/Coronary CT
TECHNIQUE: The patient was scanned on a Siemens Force scanner.
PROTOCOL: A 110 kV prospective scan was triggered in the descending thoracic
aorta at 111 HU's. Axial non-contrast 3 mm slices were carried out
through the heart. The data set was analyzed on a dedicated work
station and scored using the Agatston method. Gantry rotation speed
was 250 msecs and collimation was .6 mm. Heart rate was optimized
medically and sl NTG was given. The 3D data set was reconstructed in
5% intervals of the 35-75 % of the R-R cycle. Systolic and diastolic
phases were analyzed on a dedicated work station using MPR, MIP and
VRT modes. The patient received 100mL OMNIPAQUE IOHEXOL 350 MG/ML
SOLN of contrast.

[Series 6: best diast 71 % · axial · 0.39mm/px · z∈[+1263,+1301]mm · 2 of 291 slices shown]
[im 97/291  vessel]
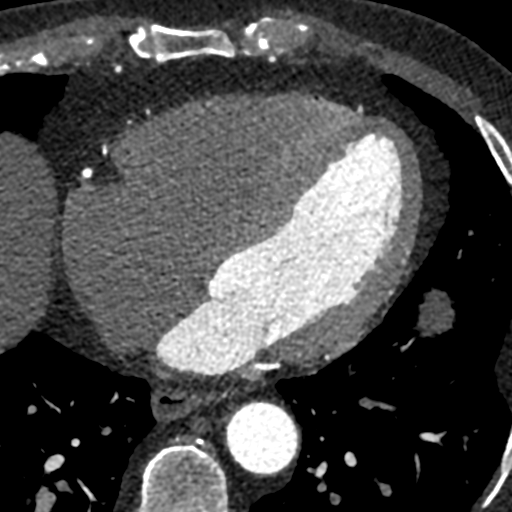
[im 194/291  vessel]
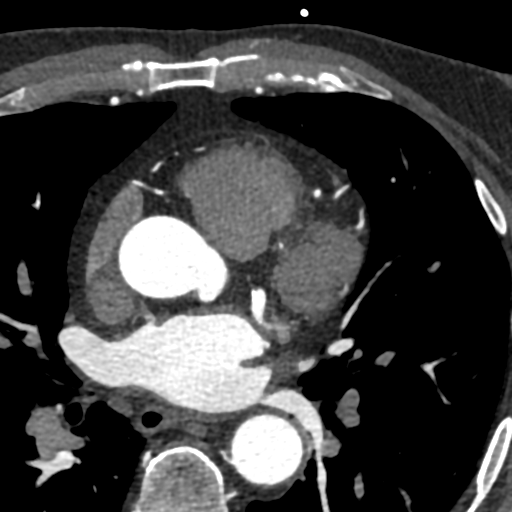

[Series 7: ts diast · axial · 0.39mm/px · z∈[+1263,+1301]mm · 2 of 291 slices shown, 3 images]
[im 97/291  vessel]
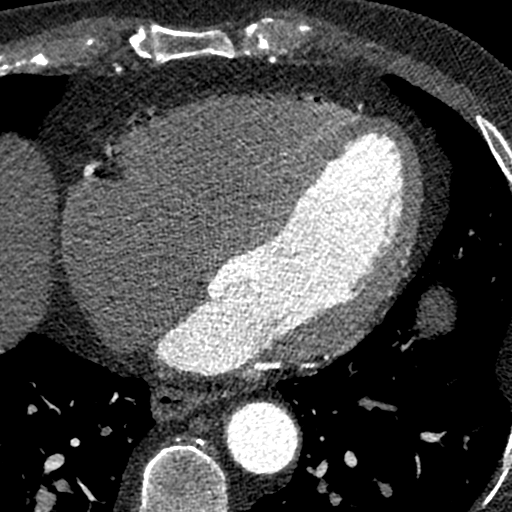
[im 97/291  lung]
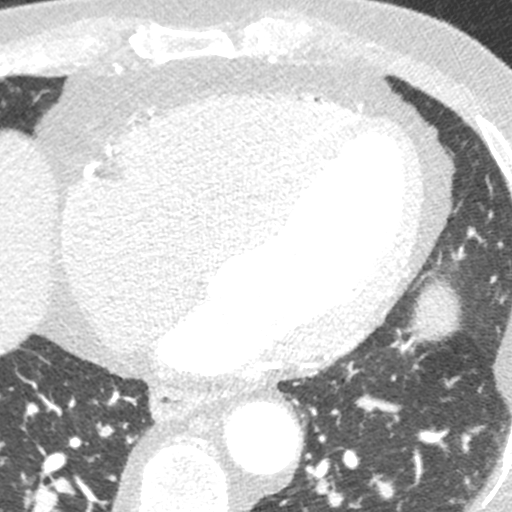
[im 194/291  vessel]
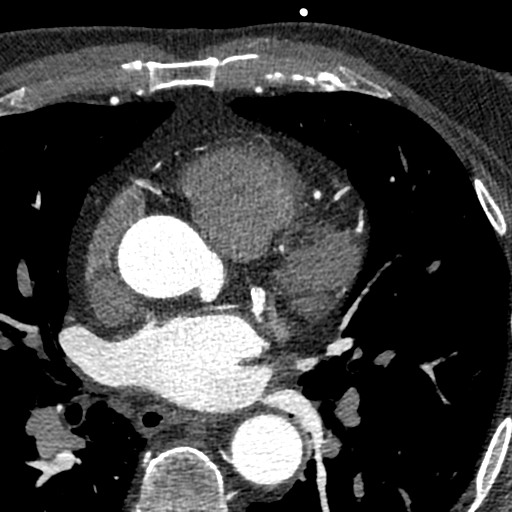

[Series 11: ts syst · axial · 0.39mm/px · z∈[+1263,+1301]mm · 2 of 291 slices shown]
[im 97/291  vessel]
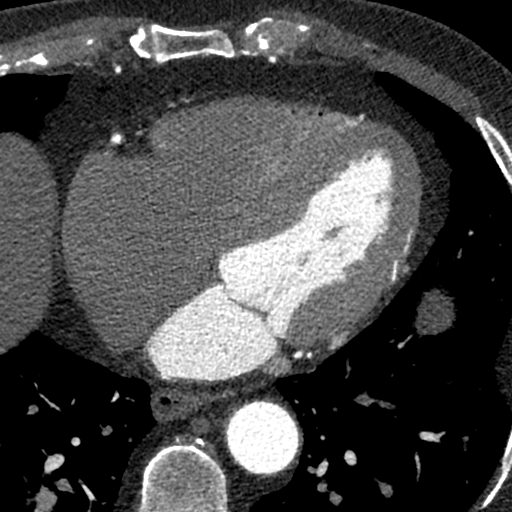
[im 194/291  vessel]
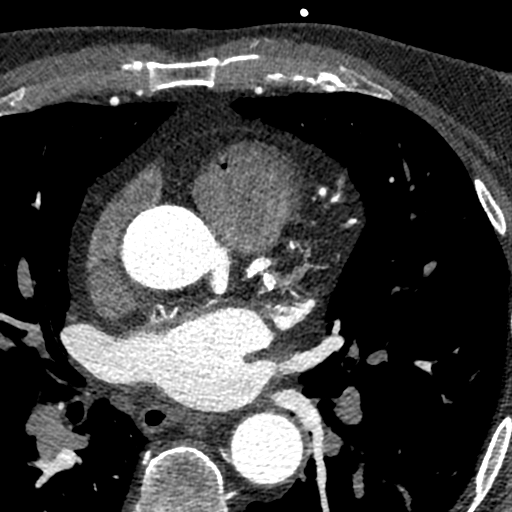

[Series 12: best syst · axial · 0.39mm/px · z∈[+1263,+1301]mm · 2 of 291 slices shown]
[im 97/291  vessel]
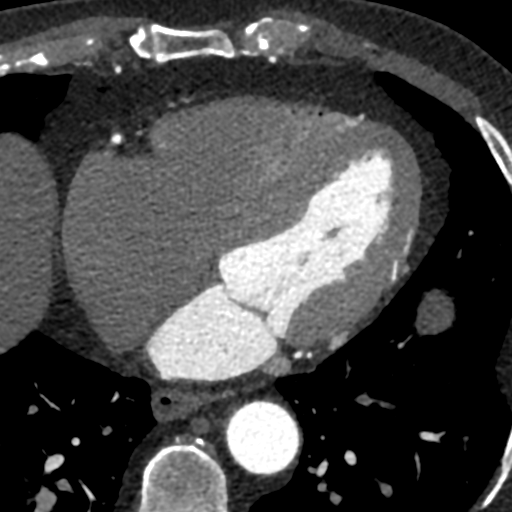
[im 194/291  vessel]
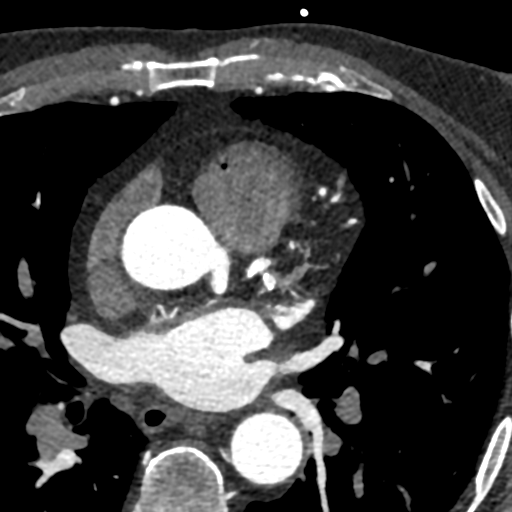

[8 of 20 positions shown; findings below may reference images not displayed]

FINDINGS: Vascular: Aortic atherosclerosis. Tortuous thoracic aorta. No
central pulmonary embolism, on this non-dedicated study.

Mediastinum/Nodes: No imaged thoracic adenopathy.

Lungs/Pleura: No pleural fluid. Mild left-sided pleural fat
hypertrophy. Clear imaged lungs.

Upper Abdomen: Normal imaged portions of the liver, stomach.

Musculoskeletal: No acute osseous abnormality.
IMPRESSION: No acute findings in the imaged extracardiac chest.

Aortic Atherosclerosis ([BH]-[BH]).
FINDINGS: Coronary calcium score: The patient's coronary artery calcium score
is 604, which places the patient in the 66th percentile.

Coronary arteries: Normal coronary origins.  Right dominance.

Right Coronary Artery: Normal caliber vessel, gives rise to PDA.
Scattered mixed calcified and predominantly noncalcified plaque,
maximum stenosis 1-24%.

Left Main Coronary Artery: Normal caliber vessel. Mild noncalcified
plaque with 1-24% stenosis.

Left Anterior Descending Coronary Artery: Normal caliber vessel.
Mixed calcified and noncalcified plaque in the proximal and mid LAD.
Maximum stenosis is 25-49%. Gives rise to 2 diagonal branches.

Left Circumflex Artery: Normal caliber vessel. Mixed calcified and
noncalcified plaque in the ostial LCx. Appears 50-69% but cannot
exclude higher degree of stenosis, as vessel then has a sharp bend.
There is scattered mixed calcified and noncalcified plaque
throughout the proximal and mid LCx with maximum 25-49% stenosis.
Gives rise to 2 OM branches.

Aorta: Normal size, 34 mm at the mid ascending aorta (level of the
PA bifurcation) measured double oblique. Trivial aortic
atherosclerosis. No dissection seen in visualized portions of the
aorta.

Aortic Valve: Mild calcifications. Trileaflet.

Other findings:

Normal pulmonary vein drainage into the left atrium.

Normal left atrial appendage without a thrombus.

Normal size of the pulmonary artery.

Normal appearance of the pericardium.
IMPRESSION: 1. Moderate CAD, CADRADS = 3. CT FFR will be performed and reported
separately.

2. Coronary calcium score of 604. This was 66th percentile for age
and sex matched control.

3. Normal coronary origin with right dominance.

INTERPRETATION:

1. CAD-RADS 0: No evidence of CAD (0%). Consider non-atherosclerotic
causes of chest pain.

2. CAD-RADS 1: Minimal non-obstructive CAD (0-24%). Consider
non-atherosclerotic causes of chest pain. Consider preventive
therapy and risk factor modification.

3. CAD-RADS 2: Mild non-obstructive CAD (25-49%). Consider
non-atherosclerotic causes of chest pain. Consider preventive
therapy and risk factor modification.

4. CAD-RADS 3: Moderate stenosis (50-69%). Consider symptom-guided
anti-ischemic pharmacotherapy as well as risk factor modification
per guideline directed care. Additional analysis with CT FFR will be
submitted.

5. CAD-RADS 4: Severe stenosis. (70-99% or > 50% left main). Cardiac
catheterization or CT FFR is recommended. Consider symptom-guided
anti-ischemic pharmacotherapy as well as risk factor modification
per guideline directed care. Invasive coronary angiography
recommended with revascularization per published guideline
statements.

6. CAD-RADS 5: Total coronary occlusion (100%). Consider cardiac
catheterization or viability assessment. Consider symptom-guided
anti-ischemic pharmacotherapy as well as risk factor modification
per guideline directed care.

7. CAD-RADS N: Non-diagnostic study. Obstructive CAD can't be
excluded. Alternative evaluation is recommended.

*** End of Addendum ***
EXAM:
OVER-READ INTERPRETATION  CT CHEST

The following report is an over-read performed by radiologist Dr.
YOSHIDA [REDACTED] on [DATE]. This over-read
does not include interpretation of cardiac or coronary anatomy or
pathology. The coronary CTA interpretation by the cardiologist is
attached.
FINDINGS: Vascular: Aortic atherosclerosis. Tortuous thoracic aorta. No
central pulmonary embolism, on this non-dedicated study.

Mediastinum/Nodes: No imaged thoracic adenopathy.

Lungs/Pleura: No pleural fluid. Mild left-sided pleural fat
hypertrophy. Clear imaged lungs.

Upper Abdomen: Normal imaged portions of the liver, stomach.

Musculoskeletal: No acute osseous abnormality.
IMPRESSION: No acute findings in the imaged extracardiac chest.

Aortic Atherosclerosis ([BH]-[BH]).

## 2022-02-05 MED ORDER — METOPROLOL TARTRATE 5 MG/5ML IV SOLN
INTRAVENOUS | Status: AC
Start: 2022-02-05 — End: 2022-02-05
  Administered 2022-02-05: 10 mg via INTRAVENOUS
  Filled 2022-02-05: qty 10

## 2022-02-05 MED ORDER — NITROGLYCERIN 0.4 MG SL SUBL
SUBLINGUAL_TABLET | SUBLINGUAL | Status: AC
  Administered 2022-02-05: 0.8 mg via SUBLINGUAL
  Filled 2022-02-05: qty 2

## 2022-02-05 MED ORDER — NITROGLYCERIN 0.4 MG SL SUBL: 0.8000 mg | SUBLINGUAL_TABLET | Freq: Once | SUBLINGUAL | Status: AC

## 2022-02-05 MED ORDER — METOPROLOL TARTRATE 5 MG/5ML IV SOLN: 10.0000 mg | Freq: Once | INTRAVENOUS | Status: AC

## 2022-02-05 MED ORDER — IOHEXOL 350 MG/ML SOLN
100.0000 mL | Freq: Once | INTRAVENOUS | Status: AC | PRN
Start: 2022-02-05 — End: 2022-02-05
  Administered 2022-02-05: 100 mL via INTRAVENOUS

## 2022-02-05 NOTE — ED Notes (Signed)
Patient in xray 

## 2022-02-05 NOTE — Consult Note (Signed)
?Cardiology Consultation:  ? ?Patient ID: Jimmy Rangel ?MRN: [id number]; DOB: January 30, 1946 ? ?Admit date: 02/05/2022 ?Date of Consult: 02/05/2022 ? ?PCP:  Jimmy Noon, MD ?  ?[facility] Providers ?Cardiologist:   ? ? ?Patient Profile:  ? ?Jimmy Rangel is a 76 y.o. male with a hx of hyperlipidemia, OSA, GERD who is being seen 02/05/2022 for the evaluation of chest pain at the request of Dr. Alvino Rangel. ? ?History of Present Illness:  ? ?Jimmy Rangel is a 76 year old male with above medical history.  Per chart review, patient established care with cardiology in December 2019.  Patient was initially seen for preop cardiovascular assessment prior to knee surgery and evaluation of cardiac murmur heard by PCP.  Patient had an echocardiogram on 09/16/2018 that showed EF 60-65%, normal wall motion, no regional wall motion abnormalities, grade 1 diastolic dysfunction, moderate calcification of aortic valve without stenosis or regurgitation.  Patient also went a nuclear stress test on 09/16/2018 that was a low risk study without evidence of prior infarct or ischemia. Patient has not been seen by cardiology since that time.  ? ?Patient presented to the ED on 02/05/22 complaining of left shoulder pain that radiated to his arm then chest.  ?EKG showed sinus rhythm, RBBB, LAFB. CXR showed no active cardiopulmonary disease. Initial hsTn 3 ? ?On interview, patient reports that he had mowed his lawn and weed whacked this morning.  After going inside and resting for about 30-40 minutes, patient began to feel pressure behind his left clavicle that eventually radiated down into his left chest.  Associated with numbness/tingling down left arm.  Had some mild shortness of breath during the episode, wife also commented that patient became "clammy".  Chest pain resolved after taking aspirin.  Patient denies having any nausea, feelings of anxiety, palpitations.  Patient denies having any shortness of breath, chest pain, nausea,  palpitations while working in his yard.  Patient also denies any symptoms on exertion recently.  Patient denies any cardiac history, denies any history of high blood pressure.  Takes rosuvastatin for hyperlipidemia.  Denies smoking history. ? ?Past Medical History:  ?Diagnosis Date  ? Actinic keratosis 10/22/2021  ? vertex scalp, EDC  ? Arthritis   ? Basal cell carcinoma 10/13/2006  ? Basal cell carcinoma 10/13/2006  ? right anterior deltoid  ? Basal cell carcinoma 08/16/2007  ? left distal posterior deltoid  ? Basal cell carcinoma 07/31/2009  ? left zygomatic lat canthus   ? Basal cell carcinoma 07/14/2010  ? right medial chest, EDC  ? Basal cell carcinoma 02/26/2020  ? left sideburn ED&C  ? Basal cell carcinoma 02/26/2020  ? left pretibia ED&C  ? Basal cell carcinoma 07/02/2020  ? L mid pretibia  ? Basal cell carcinoma 05/15/2020  ? left mid pretibia  superficial   ? Basal cell carcinoma 02/17/2021  ? right posterior neck, schedule EDC  ? Basal cell carcinoma 07/07/2021  ? L mid sternum, EDC  ? Basal cell carcinoma   ? Basal cell carcinoma 10/22/2021  ? left lower neck, EDC  ? Basal cell carcinoma (BCC) 02/17/2021  ? right upper nasolabial, MOHs 05/01/21  ? 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 right post lateral neck  ? Calcification of aortic valve 09/20/2018  ? Aortic valve: Trileaflet; moderately thickened, moderately   calcified leaflets. Valve mobility was restricted.  ? Chronic back pain   ? stenosis  ? GERD (gastroesophageal reflux disease)   ? takes Omeprazole daily  ? Headache   ?  rare  ? Heart murmur   ? History of bronchitis as a child   ? Hyperlipidemia   ? takes Atorvastatin daily  ? 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 right superior pectoral  ? 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 right nasolabial  ? Primary localized osteoarthritis of right knee   ? Sleep apnea   ? uses CPAP, does not know settings  ? Superficial basal cell carcinoma 08/16/2007  ? left distal anterior  deltoid  ? Superficial basal cell carcinoma 08/16/2007  ? left mid lateral bicep  ? Superficial basal cell carcinoma 02/18/2009  ? right medial pretibial  ? Superficial basal cell carcinoma 02/18/2009  ? right lateral calf  ? Weakness   ? left leg  ? ? ?Past Surgical History:  ?Procedure Laterality Date  ? ANTERIOR LAT LUMBAR FUSION N/A 09/02/2021  ? Procedure: STAGEI: Anterior lateral lumbar fusion, Lumbar four- lumbar five, lumbar one- lumabr -two;  Surgeon: Dawley, Theodoro Doing, DO;  Location: [facility];  Service: Neurosurgery;  Laterality: N/A;  ? BACK SURGERY    ? Lumbar fusion x2, 1997, 2016  ? CHOLECYSTECTOMY    ? COLONOSCOPY    ? ESOPHAGOGASTRODUODENOSCOPY    ? EYE SURGERY Right 2021  ? cataract  ? [place]  ? had to put screws in left elbow  ? INCISIONAL HERNIA REPAIR N/A 08/09/2020  ? Procedure: LAPAROSCOPIC REPAIR INCISIONAL HERNIA WITH MESH AND LYSIS OF ADHESIONS;  Surgeon: Greer Pickerel, MD;  Location: [facility];  Service: General;  Laterality: N/A;  ? INJECTION KNEE Left 09/19/2018  ? Procedure: LEFT KNEE INJECTION;  Surgeon: Elsie Saas, MD;  Location: [facility];  Service: Orthopedics;  Laterality: Left;  ? KNEE ARTHROSCOPY Right   ? spleenectomy    ? TOTAL KNEE ARTHROPLASTY Right 09/19/2018  ? Procedure: TOTAL KNEE ARTHROPLASTY;  Surgeon: Elsie Saas, MD;  Location: [facility];  Service: Orthopedics;  Laterality: Right;  ?  ? ?Home Medications:  ?Prior to Admission medications   ?Medication Sig Start Date End Date Taking? Authorizing Provider  ?Carboxymethylcellulose Sodium (REFRESH TEARS OP) Place 1 drop into both eyes daily as needed (Dry eyes).    [provider]  ?diclofenac (VOLTAREN) 75 MG EC tablet Take 75 mg by mouth daily.    [provider]  ?docusate sodium (COLACE) 100 MG capsule 1 tab 2 times a day while on narcotics.  STOOL SOFTENER ?Patient taking differently: 1 tab 2 times a day while on narcotics.  STOOL SOFTENER 09/21/18   Shepperson, Kirstin, PA-C  ?fluticasone  (FLONASE) 50 MCG/ACT nasal spray Place 2 sprays into both nostrils daily as needed for allergies or rhinitis.     [provider]  ?GLUCOSAMINE-CHONDROITIN PO Take 1,500 mg by mouth daily.    [provider]  ?methocarbamol (ROBAXIN) 500 MG tablet Take 1 tablet (500 mg total) by mouth 4 (four) times daily. 09/07/21   Meyran, Ocie Cornfield, NP  ?Multiple Vitamin (MULTIVITAMIN WITH MINERALS) TABS tablet Take 1 tablet by mouth daily. Package from [facility]    [provider]  ?Omega-3 1000 MG CAPS Take 1,000 mg by mouth daily.    [provider]  ?omeprazole (PRILOSEC) 10 MG capsule Take 10 mg by mouth daily.    [provider]  ?OVER THE COUNTER MEDICATION Apply 1 application topically daily. Hemp cream    [provider]  ?oxyCODONE-acetaminophen (PERCOCET) 7.5-325 MG tablet Take 1 tablet by mouth every 4 (four) hours as needed for severe pain. 09/07/21  09/07/22  Meyran, Ocie Cornfield, NP  ?rosuvastatin (CRESTOR) 10 MG tablet Take 10 mg by mouth at bedtime.    [provider]  ?tamsulosin (FLOMAX) 0.4 MG CAPS capsule Take 0.4 mg by mouth at bedtime.    [provider]  ? ? ?Inpatient Medications: ?Scheduled Meds: ? ?Continuous Infusions: ? ?PRN Meds: ? ? ?Allergies:    ?Allergies  ?Allergen Reactions  ? Atorvastatin   ?  Myalgia  ? Penicillins Other (See Comments)  ?  Passed out ?Has patient had a PCN reaction causing immediate rash, facial/tongue/throat swelling, SOB or lightheadedness with hypotension: Yes ?Has patient had a PCN reaction causing severe rash involving mucus membranes or skin necrosis: No ?Has patient had a PCN reaction that required hospitalization: No ?Has patient had a PCN reaction occurring within the last 10 years: No ?If all of the above answers are "NO", then may proceed with Cephalosporin use. ?  ? ? ?Social History:   ?Social History  ? ?Socioeconomic History  ? Marital status: Married  ?  Spouse name: Not on file  ?  Number of children: Not on file  ? Years of education: Not on file  ? Highest education level: Not on file  ?Occupational History  ? Not on file  ?Tobacco Use  ? Smoking status: Never  ? Smokeless tobacco: Never  ?Vaping Use

## 2022-02-05 NOTE — ED Provider Notes (Signed)
Cardio ?Physical Exam  ?BP 135/80   Pulse 69   Temp 98 ?F (36.7 ?C) (Oral)   Resp 16   Ht '5\' 11"'$  (1.803 m)   Wt 92.5 kg   SpO2 96%   BMI 28.44 kg/m?  ? ?Physical Exam ?Vitals and nursing note reviewed.  ?Constitutional:   ?   General: He is not in acute distress. ?   Appearance: He is well-developed.  ?HENT:  ?   Head: Normocephalic and atraumatic.  ?Eyes:  ?   Conjunctiva/sclera: Conjunctivae normal.  ?Cardiovascular:  ?   Rate and Rhythm: Normal rate and regular rhythm.  ?   Heart sounds: No murmur heard. ?Pulmonary:  ?   Effort: Pulmonary effort is normal. No respiratory distress.  ?   Breath sounds: Normal breath sounds.  ?Abdominal:  ?   Palpations: Abdomen is soft.  ?   Tenderness: There is no abdominal tenderness.  ?Musculoskeletal:     ?   General: No swelling.  ?   Cervical back: Neck supple.  ?Skin: ?   General: Skin is warm and dry.  ?   Capillary Refill: Capillary refill takes less than 2 seconds.  ?Neurological:  ?   Mental Status: He is alert.  ?Psychiatric:     ?   Mood and Affect: Mood normal.  ? ? ?Procedures  ?Procedures ? ?ED Course / MDM  ? ?Clinical Course as of 02/05/22 2226  ?Thu Feb 05, 2022  ?1603 Pending cards recs, CP [MK]  ?  ?Clinical Course User Index ?[MK] Javeion Cannedy, Debe Coder, MD  ? ?Medical Decision Making ?Amount and/or Complexity of Data Reviewed ?Labs: ordered. ?Radiology: ordered. ? ? ?Patient received in handoff.  Chest pain pending CT coronary angio and cardiology evaluation.  Evaluated the patient and deemed patient safe for discharge with outpatient cardiology follow-up. Patient then discharged ? ? ? ? ?  ?Teressa Lower, MD ?02/05/22 2226 ? ?

## 2022-02-05 NOTE — ED Provider Triage Note (Signed)
Emergency Medicine Provider Triage Evaluation Note ? ?Jimmy Rangel , a 75 y.o. male  was evaluated in triage.  Pt complains of left-sided chest pain, chest pressure that occurred just after he was mowing his yard, weed whacking.  Patient reports that pain started behind his left clavicle, moved into central chest, radiate down his left arm with some tingling.  He denies any nausea, vomiting he does report that he felt clammy at the time.  He took 324 mg aspirin and had spontaneous relief of symptoms after about 20 minutes.  He did not take any nitroglycerin.  He had a stress test and echo around 3 years ago with no significant results.  He has not had a previous catheterization, previous ACS.  He has high cholesterol, no hypertension, no diabetes, no tobacco use history.. ? ?Review of Systems  ?Positive: Chest pain ?Negative: [place] ? ?Physical Exam  ?BP (!) 159/90   Pulse 73   Temp 98 ?F (36.7 ?C) (Oral)   Resp 18   SpO2 98%  ?Gen:   Awake, no distress   ?Resp:  Normal effort  ?MSK:   Moves extremities without difficulty  ?Other:   ? ?Medical Decision Making  ?Medically screening exam initiated at 12:08 PM.  Appropriate orders placed.  Jimmy Rangel was informed that the remainder of the evaluation will be completed by another provider, this initial triage assessment does not replace that evaluation, and the importance of remaining in the ED until their evaluation is complete. ? ?Workup initiated ?  ?Anselmo Pickler, PA-C ?02/05/22 1209 ? ?

## 2022-02-05 NOTE — ED Triage Notes (Signed)
BIB [facility] after pt called to report increase C/P. Per EMS, pt was mowing the grass when he started to have left shoulder pain that radiated to arm then chest.  Pt was given 325 aspirin en route.  ? ?HX: high cholesterol  ?

## 2022-02-05 NOTE — ED Provider Notes (Signed)
?[facility] ?Provider Note ? ? ?CSN: [id number] ?Arrival date & time: 02/05/22  1153 ? ?  ? ?History ? ?Chief Complaint  ?Patient presents with  ? Chest Pain  ? ? ?Jimmy Rangel is a 76 y.o. male. ? ? ?Chest Pain ?Associated symptoms: diaphoresis   ?Associated symptoms: no shortness of breath   ? ?HPI: A 76 year old patient with a history of hypercholesterolemia presents for evaluation of chest pain. Initial onset of pain was approximately 1-3 hours ago. The patient's chest pain is described as heaviness/pressure/tightness and is not worse with exertion. The patient reports some diaphoresis. The patient's chest pain is middle- or left-sided, is not well-localized, is not sharp and does radiate to the arms/jaw/neck. The patient does not complain of nausea. The patient has no history of stroke, has no history of peripheral artery disease, has not smoked in the past 90 days, denies any history of treated diabetes, has no relevant family history of coronary artery disease (first degree relative at less than age 7), is not hypertensive and does not have an elevated BMI (>=30).  ?Patient with chest pain.  Anterior chest.  Went from left posterior upper chest to the anterior chest and down the arm.  Began after doing yard work.  Reportedly was also pale and sweaty.  Does not smoke.  Feels better now.  Took aspirin as directed by EMS and pain relieved after.  Was able to do the yard work prior to this without any issues but then about 1/2-hour after developed the pain.  Has not had pains before this. ?Home Medications ?Prior to Admission medications   ?Medication Sig Start Date End Date Taking? Authorizing Provider  ?aspirin 325 MG EC tablet Take 325 mg by mouth as needed (for onset of chest pain).   Yes [provider]  ?Carboxymethylcellulose Sodium (REFRESH TEARS OP) Place 1 drop into both eyes daily as needed (Dry eyes).    [provider]  ?diclofenac  (VOLTAREN) 75 MG EC tablet Take 75 mg by mouth daily.    [provider]  ?docusate sodium (COLACE) 100 MG capsule 1 tab 2 times a day while on narcotics.  STOOL SOFTENER ?Patient taking differently: 1 tab 2 times a day while on narcotics.  STOOL SOFTENER 09/21/18   Shepperson, Kirstin, PA-C  ?fluticasone (FLONASE) 50 MCG/ACT nasal spray Place 2 sprays into both nostrils daily as needed for allergies or rhinitis.     [provider]  ?GLUCOSAMINE-CHONDROITIN PO Take 1,500 mg by mouth daily.    [provider]  ?methocarbamol (ROBAXIN) 500 MG tablet Take 1 tablet (500 mg total) by mouth 4 (four) times daily. 09/07/21   Meyran, Ocie Cornfield, NP  ?Multiple Vitamin (MULTIVITAMIN WITH MINERALS) TABS tablet Take 1 tablet by mouth daily. Package from [facility]    [provider]  ?Omega-3 1000 MG CAPS Take 1,000 mg by mouth daily.    [provider]  ?omeprazole (PRILOSEC) 10 MG capsule Take 10 mg by mouth daily.    [provider]  ?omeprazole (PRILOSEC) 20 MG capsule Take 20 mg by mouth daily before breakfast.    [provider]  ?OVER THE COUNTER MEDICATION Apply 1 application topically daily. Hemp cream    [provider]  ?oxyCODONE-acetaminophen (PERCOCET) 7.5-325 MG tablet Take 1 tablet by mouth every 4 (four) hours as needed for severe pain. 09/07/21 09/07/22  Meyran, Ocie Cornfield, NP  ?rosuvastatin (CRESTOR) 10 MG tablet Take 10 mg by mouth at  bedtime.    [provider]  ?tamsulosin (FLOMAX) 0.4 MG CAPS capsule Take 0.4 mg by mouth at bedtime.    [provider]  ?   ? ?Allergies    ?Atorvastatin and Penicillins   ? ?Review of Systems   ?Review of Systems  ?Constitutional:  Positive for diaphoresis. Negative for appetite change.  ?Respiratory:  Negative for shortness of breath.   ?Cardiovascular:  Positive for chest pain.  ? ?Physical Exam ?Updated Vital Signs ?BP 117/80   Pulse 65   Temp 98 ?F (36.7 ?C) (Oral)   Resp 11    Ht '5\' 11"'$  (1.803 m)   Wt 92.5 kg   SpO2 95%   BMI 28.44 kg/m?  ?Physical Exam ?Vitals and nursing note reviewed.  ?Cardiovascular:  ?   Rate and Rhythm: Normal rate and regular rhythm.  ?   Heart sounds: No murmur heard. ?Pulmonary:  ?   Breath sounds: No decreased breath sounds or wheezing.  ?Chest:  ?   Chest wall: No tenderness.  ?Abdominal:  ?   Tenderness: There is no abdominal tenderness.  ?Musculoskeletal:  ?   Cervical back: Neck supple.  ?   Right lower leg: No edema.  ?   Left lower leg: No edema.  ?Neurological:  ?   Mental Status: He is alert.  ? ? ?ED Results / Procedures / Treatments   ?Labs ?(all labs ordered are listed, but only abnormal results are displayed) ?Labs Reviewed  ?BASIC METABOLIC PANEL - Abnormal; Notable for the following components:  ?    Result Value  ? Glucose, Bld 121 (*)   ? All other components within normal limits  ?CBC - Abnormal; Notable for the following components:  ? Platelets 436 (*)   ? All other components within normal limits  ?LIPID PANEL  ?TROPONIN I (HIGH SENSITIVITY)  ?TROPONIN I (HIGH SENSITIVITY)  ? ? ?EKG ?EKG Interpretation ? ?Date/Time:  Thursday February 05 2022 11:56:49 EDT ?Ventricular Rate:  67 ?PR Interval:  156 ?QRS Duration: 154 ?QT Interval:  420 ?QTC Calculation: 443 ?R Axis:   -68 ?Text Interpretation: Normal sinus rhythm with sinus arrhythmia Right bundle branch block Left anterior fascicular block  Bifascicular block Abnormal ECG When compared with ECG of 09-Sep-2018 11:13, rbbb? is new Confirmed by Davonna Belling [phone]) on 02/05/2022 12:42:30 PM ? ?Radiology ?DG Chest 2 View ? ?Result Date: 02/05/2022 ?CLINICAL DATA:  Chest pain and left shoulder pain EXAM: CHEST - 2 VIEW COMPARISON:  None. FINDINGS: The heart size and mediastinal contours are within normal limits. Both lungs are clear. The visualized skeletal structures are unremarkable. IMPRESSION: No active cardiopulmonary disease. Electronically Signed   By: Kerby Moors M.D.   On:  02/05/2022 12:43  ? ?CT CORONARY MORPH W/CTA COR W/SCORE W/CA W/CM &/OR WO/CM ? ?Addendum Date: 02/05/2022   ?ADDENDUM REPORT: 02/05/2022 16:00 HISTORY: Chest pain, nonspecific EXAM: Cardiac/Coronary CT TECHNIQUE: The patient was scanned on a [employer]. PROTOCOL: A 110 kV prospective scan was triggered in the descending thoracic aorta at 111 HU's. Axial non-contrast 3 mm slices were carried out through the heart. The data set was analyzed on a dedicated work station and scored using the Agatston method. Gantry rotation speed was 250 msecs and collimation was .6 mm. Heart rate was optimized medically and sl NTG was given. The 3D data set was reconstructed in 5% intervals of the 35-75 % of the R-R cycle. Systolic and diastolic phases were analyzed on a dedicated work station using  MPR, MIP and VRT modes. The patient received 143m OMNIPAQUE IOHEXOL 350 MG/ML SOLN of contrast. FINDINGS: Coronary calcium score: The patient's coronary artery calcium score is 604, which places the patient in the 66th percentile. Coronary arteries: Normal coronary origins.  Right dominance. Right Coronary Artery: Normal caliber vessel, gives rise to PDA. Scattered mixed calcified and predominantly noncalcified plaque, maximum stenosis 1-24%. Left Main Coronary Artery: Normal caliber vessel. Mild noncalcified plaque with 1-24% stenosis. Left Anterior Descending Coronary Artery: Normal caliber vessel. Mixed calcified and noncalcified plaque in the proximal and mid LAD. Maximum stenosis is 25-49%. Gives rise to 2 diagonal branches. Left Circumflex Artery: Normal caliber vessel. Mixed calcified and noncalcified plaque in the ostial LCx. Appears 50-69% but cannot exclude higher degree of stenosis, as vessel then has a sharp bend. There is scattered mixed calcified and noncalcified plaque throughout the proximal and mid LCx with maximum 25-49% stenosis. Gives rise to 2 OM branches. Aorta: Normal size, 34 mm at the mid ascending aorta  (level of the PA bifurcation) measured double oblique. Trivial aortic atherosclerosis. No dissection seen in visualized portions of the aorta. Aortic Valve: Mild calcifications. Trileaflet. Other findings: Normal pulmonary

## 2022-02-06 ENCOUNTER — Other Ambulatory Visit (HOSPITAL_COMMUNITY): Payer: Self-pay | Admitting: *Deleted

## 2022-02-06 ENCOUNTER — Emergency Department (HOSPITAL_COMMUNITY)
Admission: RE | Admit: 2022-02-06 | Discharge: 2022-02-06 | Disposition: A | Discharge status: Home / Self Care | Payer: Medicare Other | Source: Ambulatory Visit | Attending: Cardiology | Admitting: Cardiology

## 2022-02-06 ENCOUNTER — Other Ambulatory Visit (HOSPITAL_BASED_OUTPATIENT_CLINIC_OR_DEPARTMENT_OTHER): Payer: Self-pay | Admitting: Cardiology

## 2022-02-06 DIAGNOSIS — R931 Abnormal findings on diagnostic imaging of heart and coronary circulation: Secondary | ICD-10-CM

## 2022-02-06 DIAGNOSIS — R072 Precordial pain: Secondary | ICD-10-CM

## 2022-02-06 NOTE — Progress Notes (Signed)
Issues with FFR reporting, but no obstructive CAD noted. ? ?LM: 0.99 ?LAD: proximal 0.97, mid 0.95, distal 0.92 ?Lcx: proximal 0.98, distal 0.93 ?RCA: proximal 0.99, mid 0.97, distal 0.93 ? ?No significant stenosis by FFR. ? ?Buford Dresser, MD, PhD, [facility] ?[place]  ?[facility] at [facility] ?[address], [address] ?[place], [place] [address] ?[phone]  ?

## 2022-02-13 NOTE — Progress Notes (Signed)
?Cardiology Office Note:   ? ?Date:  02/25/2022  ? ?ID:  Jimmy Rangel, DOB 1945/11/09, MRN [id number] ? ?PCP:  Chesley Noon, MD  ?Cardiologist:  Buford Dresser, MD  ?Electrophysiologist:  None  ? ?Referring MD: Chesley Noon, MD  ? ?Chief Complaint: ED follow-up for chest pain ? ?History of Present Illness:   ? ?Jimmy Rangel is a 76 y.o. male with a history of moderate CAD noted on recent coronary CTA on 02/05/2022, hyperlipidemia, obstructive sleep apnea on CPAP, and skin cancer who is followed by Dr. Harrell Gave and presents today for ED follow-up of chest pain.  ? ?Patient was previously seen by Dr. Geraldo Pitter in 2019 for pre-op evaluation for a knee surgery and for evaluation of a cardiac murmur heard by PCP. Echo and Myoview were ordered for further evaluation. Echo showed LVEF of 60-65% with normal wall motion, and grade 1 diastolic dysfunction and moderately calcified aortic valve without stenosis or regurgitation. Myoview was low risk with no evidence of ischemia. Patient was not seen again until recent ED visit. She presented to the ED on 02/05/2022 for evaluation of left shoulder pain that radiated to his arm and chest with associated numbness/tingling down his left arm that occurred while his was working in his yard. High-sensitivity negative x2. He was seen by Dr. Harrell Gave and a coronary CTA was ordered which showed coronary calcium score of 604 (66th percentile for age and sex) and moderate CAD. FFR did not show any significant stenosis. ? ?Patient presents today for follow-up. Here alone. Patient is doing well since recent ED visit. No recurrent chest pain. He was working in his yard again yesterday with no problems. No shortness of breath, orthopnea, PND, lower extremity edema, palpitations, lightheadedness, dizziness, or syncope. He saw his PCP earlier this week and was started on Tamsulosin. He has had sleep apnea for 25-30 years and his compliant with his CPAP. ? ?Past Medical  History:  ?Diagnosis Date  ? Actinic keratosis 10/22/2021  ? vertex scalp, EDC  ? Arthritis   ? Basal cell carcinoma 10/13/2006  ? Basal cell carcinoma 10/13/2006  ? right anterior deltoid  ? Basal cell carcinoma 08/16/2007  ? left distal posterior deltoid  ? Basal cell carcinoma 07/31/2009  ? left zygomatic lat canthus   ? Basal cell carcinoma 07/14/2010  ? right medial chest, EDC  ? Basal cell carcinoma 02/26/2020  ? left sideburn ED&C  ? Basal cell carcinoma 02/26/2020  ? left pretibia ED&C  ? Basal cell carcinoma 07/02/2020  ? L mid pretibia  ? Basal cell carcinoma 05/15/2020  ? left mid pretibia  superficial   ? Basal cell carcinoma 02/17/2021  ? right posterior neck, schedule EDC  ? Basal cell carcinoma 07/07/2021  ? L mid sternum, EDC  ? Basal cell carcinoma   ? Basal cell carcinoma 10/22/2021  ? left lower neck, EDC  ? Basal cell carcinoma (BCC) 02/17/2021  ? right upper nasolabial, MOHs 05/01/21  ? 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 right post lateral neck  ? CAD (coronary artery disease)   ? a. coronary CTA in 01/2022: Coronary calcium score 604 (66th percentile for age and sex) and moderate CAD. FFR showed no significant stenosis.  ? Calcification of aortic valve 09/20/2018  ? Aortic valve: Trileaflet; moderately thickened, moderately   calcified leaflets. Valve mobility was restricted.  ? Chronic back pain   ? stenosis  ? GERD (gastroesophageal reflux disease)   ? takes Omeprazole daily  ?  Headache   ? rare  ? Heart murmur   ? History of bronchitis as a child   ? Hyperlipidemia   ? takes Atorvastatin daily  ? 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 right superior pectoral  ? 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 right nasolabial  ? Primary localized osteoarthritis of right knee   ? Sleep apnea   ? uses CPAP, does not know settings  ? Superficial basal cell carcinoma 08/16/2007  ? left distal anterior deltoid  ? Superficial basal cell carcinoma 08/16/2007  ? left mid lateral bicep  ?  Superficial basal cell carcinoma 02/18/2009  ? right medial pretibial  ? Superficial basal cell carcinoma 02/18/2009  ? right lateral calf  ? Weakness   ? left leg  ? ? ?Past Surgical History:  ?Procedure Laterality Date  ? ANTERIOR LAT LUMBAR FUSION N/A 09/02/2021  ? Procedure: STAGEI: Anterior lateral lumbar fusion, Lumbar four- lumbar five, lumbar one- lumabr -two;  Surgeon: Dawley, Theodoro Doing, DO;  Location: [facility];  Service: Neurosurgery;  Laterality: N/A;  ? BACK SURGERY    ? Lumbar fusion x2, 1997, 2016  ? CHOLECYSTECTOMY    ? COLONOSCOPY    ? ESOPHAGOGASTRODUODENOSCOPY    ? EYE SURGERY Right 2021  ? cataract  ? [place]  ? had to put screws in left elbow  ? INCISIONAL HERNIA REPAIR N/A 08/09/2020  ? Procedure: LAPAROSCOPIC REPAIR INCISIONAL HERNIA WITH MESH AND LYSIS OF ADHESIONS;  Surgeon: Greer Pickerel, MD;  Location: [facility];  Service: General;  Laterality: N/A;  ? INJECTION KNEE Left 09/19/2018  ? Procedure: LEFT KNEE INJECTION;  Surgeon: Elsie Saas, MD;  Location: [facility];  Service: Orthopedics;  Laterality: Left;  ? KNEE ARTHROSCOPY Right   ? spleenectomy    ? TOTAL KNEE ARTHROPLASTY Right 09/19/2018  ? Procedure: TOTAL KNEE ARTHROPLASTY;  Surgeon: Elsie Saas, MD;  Location: [facility];  Service: Orthopedics;  Laterality: Right;  ? ? ?Current Medications: ?Current Meds  ?Medication Sig  ? aspirin EC 81 MG tablet Take 81 mg by mouth daily. Swallow whole.  ? Carboxymethylcellulose Sodium (REFRESH TEARS OP) Place 1 drop into both eyes daily as needed (Dry eyes).  ? diclofenac (VOLTAREN) 75 MG EC tablet Take 75 mg by mouth daily.  ? docusate sodium (COLACE) 100 MG capsule 1 tab 2 times a day while on narcotics.  STOOL SOFTENER  ? fluticasone (FLONASE) 50 MCG/ACT nasal spray Place 2 sprays into both nostrils daily as needed for allergies or rhinitis.   ? GLUCOSAMINE-CHONDROITIN PO Take 1,500 mg by mouth daily.  ? methocarbamol (ROBAXIN) 500 MG tablet Take 1 tablet (500 mg total) by mouth 4 (four)  times daily.  ? Multiple Vitamin (MULTIVITAMIN WITH MINERALS) TABS tablet Take 1 tablet by mouth daily. Package from [facility]  ? nitroGLYCERIN (NITROSTAT) 0.4 MG SL tablet Place 1 tablet (0.4 mg total) under the tongue every 5 (five) minutes as needed for chest pain.  ? Omega-3 1000 MG CAPS Take 1,000 mg by mouth daily.  ? omeprazole (PRILOSEC) 20 MG capsule Take 20 mg by mouth daily before breakfast.  ? OVER THE COUNTER MEDICATION Apply 1 application. topically daily as needed (For pain).  ? oxyCODONE-acetaminophen (PERCOCET) 7.5-325 MG tablet Take 1 tablet by mouth every 4 (four) hours as needed for severe pain.  ? rosuvastatin (CRESTOR) 10 MG tablet Take 10 mg by mouth at bedtime.  ? tamsulosin (FLOMAX) 0.4 MG CAPS capsule Take 0.4 mg by mouth at bedtime.  ? [  DISCONTINUED] aspirin 325 MG EC tablet Take 325 mg by mouth as needed (for onset of chest pain).  ?  ? ?Allergies:   Atorvastatin and Penicillins  ? ?Social History  ? ?Socioeconomic History  ? Marital status: Married  ?  Spouse name: Not on file  ? Number of children: Not on file  ? Years of education: Not on file  ? Highest education level: Not on file  ?Occupational History  ? Not on file  ?Tobacco Use  ? Smoking status: Never  ? Smokeless tobacco: Never  ?Vaping Use  ? Vaping Use: Never used  ?Substance and Sexual Activity  ? Alcohol use: Yes  ?  Comment: occ  ? Drug use: No  ? Sexual activity: Not Currently  ?Other Topics Concern  ? Not on file  ?Social History Narrative  ? Not on file  ? ?Social Determinants of Health  ? ?Financial Resource Strain: Not on file  ?Food Insecurity: Not on file  ?Transportation Needs: Not on file  ?Physical Activity: Not on file  ?Stress: Not on file  ?Social Connections: Not on file  ?  ? ?Family History: ?The patient's family history includes Leukemia in his mother; Lung cancer in his father. ? ?ROS:   ?Please see the history of present illness.    ? ?EKGs/Labs/Other Studies Reviewed:   ? ?The following studies were  reviewed: ? ?Echocardiogram 09/16/2018: ?Study Conclusions: ?- Left ventricle: The cavity size was normal. Wall thickness was  ?  normal. Systolic function was normal. The estimated ejection  ?  fraction was in the

## 2022-02-24 ENCOUNTER — Encounter: Payer: Self-pay | Admitting: Student

## 2022-02-24 DIAGNOSIS — I2510 Atherosclerotic heart disease of native coronary artery without angina pectoris: Secondary | ICD-10-CM | POA: Insufficient documentation

## 2022-02-25 ENCOUNTER — Encounter: Payer: Self-pay | Admitting: Student

## 2022-02-25 ENCOUNTER — Ambulatory Visit: Payer: Medicare Other | Admitting: Student

## 2022-02-25 VITALS — BP 138/76 | HR 89 | Ht 71.0 in | Wt 208.2 lb

## 2022-02-25 DIAGNOSIS — E785 Hyperlipidemia, unspecified: Secondary | ICD-10-CM

## 2022-02-25 DIAGNOSIS — R011 Cardiac murmur, unspecified: Secondary | ICD-10-CM | POA: Diagnosis not present

## 2022-02-25 DIAGNOSIS — I2510 Atherosclerotic heart disease of native coronary artery without angina pectoris: Secondary | ICD-10-CM | POA: Diagnosis not present

## 2022-02-25 DIAGNOSIS — R030 Elevated blood-pressure reading, without diagnosis of hypertension: Secondary | ICD-10-CM

## 2022-02-25 DIAGNOSIS — G4733 Obstructive sleep apnea (adult) (pediatric): Secondary | ICD-10-CM

## 2022-02-25 MED ORDER — NITROGLYCERIN 0.4 MG SL SUBL: 0.4000 mg | SUBLINGUAL_TABLET | SUBLINGUAL | 3 refills | Status: DC | PRN

## 2022-02-25 NOTE — Patient Instructions (Addendum)
Medication Instructions:  ?Nitroglycerin .4 mg use as needed  ?*If you need a refill on your cardiac medications before your next appointment, please call your pharmacy* ? ? ?Lab Work: ?NONE ordered at this time of appointment  ? ?If you have labs (blood work) drawn today and your tests are completely normal, you will receive your results only by: ?MyChart Message (if you have MyChart) OR ?A paper copy in the mail ?If you have any lab test that is abnormal or we need to change your treatment, we will call you to review the results. ? ? ?Testing/Procedures: ?Your physician has requested that you have an echocardiogram. Echocardiography is a painless test that uses sound waves to create images of your heart. It provides your doctor with information about the size and shape of your heart and how well your heart?s chambers and valves are working. This procedure takes approximately one hour. There are no restrictions for this procedure.  ? ? ?Follow-Up: ?At [facility], you and your health needs are our priority.  As part of our continuing mission to provide you with exceptional heart care, we have created designated Provider Care Teams.  These Care Teams include your primary Cardiologist (physician) and Advanced Practice Providers (APPs -  Physician Assistants and Nurse Practitioners) who all work together to provide you with the care you need, when you need it. ? ?We recommend signing up for the patient portal called "MyChart".  Sign up information is provided on this After Visit Summary.  MyChart is used to connect with patients for Virtual Visits (Telemedicine).  Patients are able to view lab/test results, encounter notes, upcoming appointments, etc.  Non-urgent messages can be sent to your provider as well.   ?To learn more about what you can do with MyChart, go to [email or web address].   ? ?Your next appointment:   ?1 year(s) ? ?The format for your next appointment:   ?In Person ? ?Provider:   ?Buford Dresser, MD  ? ? ?Other Instructions ? ? ?Important Information About Sugar ? ? ? ? ? ? ?

## 2022-03-17 ENCOUNTER — Other Ambulatory Visit: Payer: Self-pay | Admitting: Neurological Surgery

## 2022-03-17 DIAGNOSIS — M5440 Lumbago with sciatica, unspecified side: Secondary | ICD-10-CM

## 2022-03-18 ENCOUNTER — Ambulatory Visit (HOSPITAL_COMMUNITY): Payer: Medicare Other | Attending: Cardiovascular Disease

## 2022-03-18 ENCOUNTER — Other Ambulatory Visit (HOSPITAL_COMMUNITY): Payer: Medicare Other

## 2022-03-18 DIAGNOSIS — R011 Cardiac murmur, unspecified: Secondary | ICD-10-CM | POA: Diagnosis present

## 2022-03-18 LAB — ECHOCARDIOGRAM COMPLETE
AR max vel: 1.88 cm2
AV Area VTI: 1.58 cm2
AV Area mean vel: 1.48 cm2
AV Mean grad: 13 mmHg
AV Peak grad: 23 mmHg
Ao pk vel: 2.4 m/s
Area-P 1/2: 3.23 cm2
S' Lateral: 2.3 cm

## 2022-04-09 ENCOUNTER — Ambulatory Visit
Admission: RE | Admit: 2022-04-09 | Discharge: 2022-04-09 | Disposition: A | Discharge status: Home / Self Care | Payer: Medicare Other | Source: Ambulatory Visit | Attending: Neurological Surgery | Admitting: Neurological Surgery

## 2022-04-09 DIAGNOSIS — M5440 Lumbago with sciatica, unspecified side: Secondary | ICD-10-CM

## 2022-05-04 ENCOUNTER — Encounter: Payer: Self-pay | Admitting: Dermatology

## 2022-05-04 ENCOUNTER — Ambulatory Visit: Payer: Medicare Other | Admitting: Dermatology

## 2022-05-04 DIAGNOSIS — L578 Other skin changes due to chronic exposure to nonionizing radiation: Secondary | ICD-10-CM

## 2022-05-04 DIAGNOSIS — L918 Other hypertrophic disorders of the skin: Secondary | ICD-10-CM

## 2022-05-04 DIAGNOSIS — L821 Other seborrheic keratosis: Secondary | ICD-10-CM

## 2022-05-04 DIAGNOSIS — L820 Inflamed seborrheic keratosis: Secondary | ICD-10-CM | POA: Diagnosis not present

## 2022-05-04 DIAGNOSIS — Z85828 Personal history of other malignant neoplasm of skin: Secondary | ICD-10-CM

## 2022-05-04 DIAGNOSIS — L570 Actinic keratosis: Secondary | ICD-10-CM | POA: Diagnosis not present

## 2022-05-04 DIAGNOSIS — L853 Xerosis cutis: Secondary | ICD-10-CM

## 2022-05-04 DIAGNOSIS — Z1283 Encounter for screening for malignant neoplasm of skin: Secondary | ICD-10-CM | POA: Diagnosis not present

## 2022-05-04 DIAGNOSIS — D229 Melanocytic nevi, unspecified: Secondary | ICD-10-CM

## 2022-05-04 DIAGNOSIS — L814 Other melanin hyperpigmentation: Secondary | ICD-10-CM

## 2022-05-04 DIAGNOSIS — L720 Epidermal cyst: Secondary | ICD-10-CM

## 2022-05-04 DIAGNOSIS — D1800 Hemangioma unspecified site: Secondary | ICD-10-CM

## 2022-05-04 NOTE — Patient Instructions (Addendum)
Cryotherapy Aftercare  Wash gently with soap and water everyday.   Apply Vaseline and Band-Aid daily until healed.   Melanoma ABCDEs  Melanoma is the most dangerous type of skin cancer, and is the leading cause of death from skin disease.  You are more likely to develop melanoma if you: Have light-colored skin, light-colored eyes, or red or blond hair Spend a lot of time in the sun Tan regularly, either outdoors or in a tanning bed Have had blistering sunburns, especially during childhood Have a close family member who has had a melanoma Have atypical moles or large birthmarks  Early detection of melanoma is key since treatment is typically straightforward and cure rates are extremely high if we catch it early.   The first sign of melanoma is often a change in a mole or a new dark spot.  The ABCDE system is a way of remembering the signs of melanoma.  A for asymmetry:  The two halves do not match. B for border:  The edges of the growth are irregular. C for color:  A mixture of colors are present instead of an even brown color. D for diameter:  Melanomas are usually (but not always) greater than 6mm - the size of a pencil eraser. E for evolution:  The spot keeps changing in size, shape, and color.  Please check your skin once per month between visits. You can use a small mirror in front and a large mirror behind you to keep an eye on the back side or your body.   If you see any new or changing lesions before your next follow-up, please call to schedule a visit.  Please continue daily skin protection including broad spectrum sunscreen SPF 30+ to sun-exposed areas, reapplying every 2 hours as needed when you're outdoors.     Due to recent changes in healthcare laws, you may see results of your pathology and/or laboratory studies on MyChart before the doctors have had a chance to review them. We understand that in some cases there may be results that are confusing or concerning to you.  Please understand that not all results are received at the same time and often the doctors may need to interpret multiple results in order to provide you with the best plan of care or course of treatment. Therefore, we ask that you please give us 2 business days to thoroughly review all your results before contacting the office for clarification. Should we see a critical lab result, you will be contacted sooner.   If You Need Anything After Your Visit  If you have any questions or concerns for your doctor, please call our main line at [phone] and press option 4 to reach your doctor's medical assistant. If no one answers, please leave a voicemail as directed and we will return your call as soon as possible. Messages left after 4 pm will be answered the following business day.   You may also send us a message via MyChart. We typically respond to MyChart messages within 1-2 business days.  For prescription refills, please ask your pharmacy to contact our office. Our fax number is [phone].  If you have an urgent issue when the clinic is closed that cannot wait until the next business day, you can page your doctor at the number below.    Please note that while we do our best to be available for urgent issues outside of office hours, we are not available 24/7.   If you have an urgent   issue and are unable to reach us, you may choose to seek medical care at your doctor's office, retail clinic, urgent care center, or emergency room.  If you have a medical emergency, please immediately call 911 or go to the emergency department.  Pager Numbers  - Dr. Kowalski: [phone]  - Dr. Moye: [phone]  - Dr. Stewart: [phone]  In the event of inclement weather, please call our main line at [phone] for an update on the status of any delays or closures.  Dermatology Medication Tips: Please keep the boxes that topical medications come in in order to help keep track of the  instructions about where and how to use these. Pharmacies typically print the medication instructions only on the boxes and not directly on the medication tubes.   If your medication is too expensive, please contact our office at [phone] option 4 or send us a message through MyChart.   We are unable to tell what your co-pay for medications will be in advance as this is different depending on your insurance coverage. However, we may be able to find a substitute medication at lower cost or fill out paperwork to get insurance to cover a needed medication.   If a prior authorization is required to get your medication covered by your insurance company, please allow us 1-2 business days to complete this process.  Drug prices often vary depending on where the prescription is filled and some pharmacies may offer cheaper prices.  The website www.goodrx.com contains coupons for medications through different pharmacies. The prices here do not account for what the cost may be with help from insurance (it may be cheaper with your insurance), but the website can give you the price if you did not use any insurance.  - You can print the associated coupon and take it with your prescription to the pharmacy.  - You may also stop by our office during regular business hours and pick up a GoodRx coupon card.  - If you need your prescription sent electronically to a different pharmacy, notify our office through [facility] MyChart or by phone at [phone] option 4.     Si Usted Necesita Algo Despus de Su Visita  Tambin puede enviarnos un mensaje a travs de MyChart. Por lo general respondemos a los mensajes de MyChart en el transcurso de 1 a 2 das hbiles.  Para renovar recetas, por favor pida a su farmacia que se ponga en contacto con nuestra oficina. Nuestro nmero de fax es el [phone].  Si tiene un asunto urgente cuando la clnica est cerrada y que no puede esperar hasta el siguiente da hbil,  puede llamar/localizar a su doctor(a) al nmero que aparece a continuacin.   Por favor, tenga en cuenta que aunque hacemos todo lo posible para estar disponibles para asuntos urgentes fuera del horario de oficina, no estamos disponibles las 24 horas del da, los 7 das de la semana.   Si tiene un problema urgente y no puede comunicarse con nosotros, puede optar por buscar atencin mdica  en el consultorio de su doctor(a), en una clnica privada, en un centro de atencin urgente o en una sala de emergencias.  Si tiene una emergencia mdica, por favor llame inmediatamente al 911 o vaya a la sala de emergencias.  Nmeros de bper  - Dr. Kowalski: [phone]  - Dra. Moye: [phone]  - Dra. Stewart: [phone]  En caso de inclemencias del tiempo, por favor llame a nuestra lnea principal al [phone] para una actualizacin   sobre el estado de cualquier retraso o cierre.  Consejos para la medicacin en dermatologa: Por favor, guarde las cajas en las que vienen los medicamentos de uso tpico para ayudarle a seguir las instrucciones sobre dnde y cmo usarlos. Las farmacias generalmente imprimen las instrucciones del medicamento slo en las cajas y no directamente en los tubos del medicamento.   Si su medicamento es muy caro, por favor, pngase en contacto con nuestra oficina llamando al [phone] y presione la opcin 4 o envenos un mensaje a travs de MyChart.   No podemos decirle cul ser su copago por los medicamentos por adelantado ya que esto es diferente dependiendo de la cobertura de su seguro. Sin embargo, es posible que podamos encontrar un medicamento sustituto a menor costo o llenar un formulario para que el seguro cubra el medicamento que se considera necesario.   Si se requiere una autorizacin previa para que su compaa de seguros cubra su medicamento, por favor permtanos de 1 a 2 das hbiles para completar este proceso.  Los precios de los medicamentos varan con  frecuencia dependiendo del lugar de dnde se surte la receta y alguna farmacias pueden ofrecer precios ms baratos.  El sitio web www.goodrx.com tiene cupones para medicamentos de diferentes farmacias. Los precios aqu no tienen en cuenta lo que podra costar con la ayuda del seguro (puede ser ms barato con su seguro), pero el sitio web puede darle el precio si no utiliz ningn seguro.  - Puede imprimir el cupn correspondiente y llevarlo con su receta a la farmacia.  - Tambin puede pasar por nuestra oficina durante el horario de atencin regular y recoger una tarjeta de cupones de GoodRx.  - Si necesita que su receta se enve electrnicamente a una farmacia diferente, informe a nuestra oficina a travs de MyChart de [facility] o por telfono llamando al [phone] y presione la opcin 4.  

## 2022-05-04 NOTE — Progress Notes (Signed)
Follow-Up Visit   Subjective  Jimmy Rangel is a 76 y.o. male who presents for the following: UBSE (The patient presents for Upper Body Skin Exam (UBSE) for skin cancer screening and mole check.  The patient has spots, moles and lesions to be evaluated, some may be new or changing and the patient has concerns that these could be cancer. Patient with hx of BCC and AK's. /). He has itchy, irritated growth on L cheek that he picks at.   The following portions of the chart were reviewed this encounter and updated as appropriate:       Review of Systems:  No other skin or systemic complaints except as noted in HPI or Assessment and Plan.  Objective  Well appearing patient in no apparent distress; mood and affect are within normal limits.  All skin waist up examined.  forehead x 10, R preauricular x 1, R malar cheek x 1, L zygoma x 1, L mandible x 1, L nasal dorsum x 2, crown x 5 (21) Erythematous thin papules/macules with gritty scale.   left preauricular Erythematous stuck-on, waxy papule or plaque  spinal upper back 6 mm firm pink papule    Assessment & Plan  AK (actinic keratosis) (21) forehead x 10, R preauricular x 1, R malar cheek x 1, L zygoma x 1, L mandible x 1, L nasal dorsum x 2, crown x 5  Actinic keratoses are precancerous spots that appear secondary to cumulative UV radiation exposure/sun exposure over time. They are chronic with expected duration over 1 year. A portion of actinic keratoses will progress to squamous cell carcinoma of the skin. It is not possible to reliably predict which spots will progress to skin cancer and so treatment is recommended to prevent development of skin cancer.  Recommend daily broad spectrum sunscreen SPF 30+ to sun-exposed areas, reapply every 2 hours as needed.  Recommend staying in the shade or wearing long sleeves, sun glasses (UVA+UVB protection) and wide brim hats (4-inch brim around the entire circumference of the hat). Call  for new or changing lesions.   Destruction of lesion - forehead x 10, R preauricular x 1, R malar cheek x 1, L zygoma x 1, L mandible x 1, L nasal dorsum x 2, crown x 5  Destruction method: cryotherapy   Informed consent: discussed and consent obtained   Lesion destroyed using liquid nitrogen: Yes   Region frozen until ice ball extended beyond lesion: Yes   Outcome: patient tolerated procedure well with no complications   Post-procedure details: wound care instructions given   Additional details:  Prior to procedure, discussed risks of blister formation, small wound, skin dyspigmentation, or rare scar following cryotherapy. Recommend Vaseline ointment to treated areas while healing.   Inflamed seborrheic keratosis left preauricular  Symptomatic, irritating, patient would like treated.   Destruction of lesion - left preauricular  Destruction method: cryotherapy   Informed consent: discussed and consent obtained   Lesion destroyed using liquid nitrogen: Yes   Region frozen until ice ball extended beyond lesion: Yes   Outcome: patient tolerated procedure well with no complications   Post-procedure details: wound care instructions given   Additional details:  Prior to procedure, discussed risks of blister formation, small wound, skin dyspigmentation, or rare scar following cryotherapy. Recommend Vaseline ointment to treated areas while healing.   Epidermal inclusion cyst spinal upper back  Benign-appearing. Exam most consistent with an epidermal inclusion cyst. Discussed that a cyst is a benign growth that can  grow over time and sometimes get irritated or inflamed. Recommend observation if it is not bothersome. Discussed option of surgical excision to remove it if it is growing, symptomatic, or other changes noted. Please call for new or changing lesions so they can be evaluated.  Patient advises his wife did squeeze it and did get drainage from it. It has since gone down in size.  RTC  if changes.  Recheck on f/up.      Lentigines - Scattered tan macules - Due to sun exposure - Benign-appearing, observe - Recommend daily broad spectrum sunscreen SPF 30+ to sun-exposed areas, reapply every 2 hours as needed. - Call for any changes  Seborrheic Keratoses - Stuck-on, waxy, tan-brown papules and/or plaques  - Benign-appearing - Discussed benign etiology and prognosis. - Observe - Call for any changes  Melanocytic Nevi - Tan-brown and/or pink-flesh-colored symmetric macules and papules - Benign appearing on exam today - Observation - Call clinic for new or changing moles - Recommend daily use of broad spectrum spf 30+ sunscreen to sun-exposed areas.   Hemangiomas - Red papules - Discussed benign nature - Observe - Call for any changes  Actinic Damage - Chronic condition, secondary to cumulative UV/sun exposure - diffuse scaly erythematous macules with underlying dyspigmentation - Recommend daily broad spectrum sunscreen SPF 30+ to sun-exposed areas, reapply every 2 hours as needed.  - Staying in the shade or wearing long sleeves, sun glasses (UVA+UVB protection) and wide brim hats (4-inch brim around the entire circumference of the hat) are also recommended for sun protection.  - Call for new or changing lesions.  Skin cancer screening performed today.  History of Basal Cell Carcinoma of the Skin - No evidence of recurrence today, multiple sites, see history - Recommend regular full body skin exams - Recommend daily broad spectrum sunscreen SPF 30+ to sun-exposed areas, reapply every 2 hours as needed.  - Call if any new or changing lesions are noted between office visits  Acrochordons (Skin Tags) - Fleshy, skin-colored pedunculated papules - Benign appearing.  - Observe. - If desired, they can be removed with an in office procedure that is not covered by insurance. - Please call the clinic if you notice any new or changing lesions.  Xerosis -  diffuse xerotic patches - recommend gentle, hydrating skin care - gentle skin care handout given  Return in about 6 months (around 11/04/2022) for UBSE.  Graciella Belton, RMA, am acting as scribe for [name], MD .  Documentation: I have reviewed the above documentation for accuracy and completeness, and I agree with the above.  [name] MD

## 2022-11-11 DIAGNOSIS — D508 Other iron deficiency anemias: Secondary | ICD-10-CM | POA: Insufficient documentation

## 2022-11-17 ENCOUNTER — Ambulatory Visit: Payer: Medicare Other | Admitting: Dermatology

## 2022-11-17 ENCOUNTER — Encounter: Payer: Self-pay | Admitting: Dermatology

## 2022-11-17 VITALS — BP 140/78 | HR 68

## 2022-11-17 DIAGNOSIS — L821 Other seborrheic keratosis: Secondary | ICD-10-CM

## 2022-11-17 DIAGNOSIS — L820 Inflamed seborrheic keratosis: Secondary | ICD-10-CM

## 2022-11-17 DIAGNOSIS — L814 Other melanin hyperpigmentation: Secondary | ICD-10-CM

## 2022-11-17 DIAGNOSIS — L570 Actinic keratosis: Secondary | ICD-10-CM

## 2022-11-17 DIAGNOSIS — Z85828 Personal history of other malignant neoplasm of skin: Secondary | ICD-10-CM

## 2022-11-17 DIAGNOSIS — C4441 Basal cell carcinoma of skin of scalp and neck: Secondary | ICD-10-CM

## 2022-11-17 DIAGNOSIS — Z1283 Encounter for screening for malignant neoplasm of skin: Secondary | ICD-10-CM

## 2022-11-17 DIAGNOSIS — L578 Other skin changes due to chronic exposure to nonionizing radiation: Secondary | ICD-10-CM

## 2022-11-17 DIAGNOSIS — D229 Melanocytic nevi, unspecified: Secondary | ICD-10-CM

## 2022-11-17 DIAGNOSIS — D489 Neoplasm of uncertain behavior, unspecified: Secondary | ICD-10-CM

## 2022-11-17 NOTE — Patient Instructions (Addendum)
Electrodesiccation and Curettage ("Scrape and Burn") Wound Care Instructions  Leave the original bandage on for 24 hours if possible.  If the bandage becomes soaked or soiled before that time, it is OK to remove it and examine the wound.  A small amount of post-operative bleeding is normal.  If excessive bleeding occurs, remove the bandage, place gauze over the site and apply continuous pressure (no peeking) over the area for 30 minutes. If this does not work, please call our clinic as soon as possible or page your doctor if it is after hours.   Once a day, cleanse the wound with soap and water. It is fine to shower. If a thick crust develops you may use a Q-tip dipped into dilute hydrogen peroxide (mix 1:1 with water) to dissolve it.  Hydrogen peroxide can slow the healing process, so use it only as needed.    After washing, apply petroleum jelly (Vaseline) or an antibiotic ointment if your doctor prescribed one for you, followed by a bandage.    For best healing, the wound should be covered with a layer of ointment at all times. If you are not able to keep the area covered with a bandage to hold the ointment in place, this may mean re-applying the ointment several times a day.  Continue this wound care until the wound has healed and is no longer open. It may take several weeks for the wound to heal and close.  Itching and mild discomfort is normal during the healing process.  If you have any discomfort, you can take Tylenol (acetaminophen) or ibuprofen as directed on the bottle. (Please do not take these if you have an allergy to them or cannot take them for another reason).  Some redness, tenderness and white or yellow material in the wound is normal healing.  If the area becomes very sore and red, or develops a thick yellow-green material (pus), it may be infected; please notify us.    Wound healing continues for up to one year following surgery. It is not unusual to experience pain in the scar  from time to time during the interval.  If the pain becomes severe or the scar thickens, you should notify the office.    A slight amount of redness in a scar is expected for the first six months.  After six months, the redness will fade and the scar will soften and fade.  The color difference becomes less noticeable with time.  If there are any problems, return for a post-op surgery check at your earliest convenience.  To improve the appearance of the scar, you can use silicone scar gel, cream, or sheets (such as Mederma or Serica) every night for up to one year. These are available over the counter (without a prescription).  Please call our office at [phone] for any questions or concerns.  Biopsy Wound Care Instructions  Leave the original bandage on for 24 hours if possible.  If the bandage becomes soaked or soiled before that time, it is OK to remove it and examine the wound.  A small amount of post-operative bleeding is normal.  If excessive bleeding occurs, remove the bandage, place gauze over the site and apply continuous pressure (no peeking) over the area for 30 minutes. If this does not work, please call our clinic as soon as possible or page your doctor if it is after hours.   Once a day, cleanse the wound with soap and water. It is fine to shower. If  a thick crust develops you may use a Q-tip dipped into dilute hydrogen peroxide (mix 1:1 with water) to dissolve it.  Hydrogen peroxide can slow the healing process, so use it only as needed.    After washing, apply petroleum jelly (Vaseline) or an antibiotic ointment if your doctor prescribed one for you, followed by a bandage.    For best healing, the wound should be covered with a layer of ointment at all times. If you are not able to keep the area covered with a bandage to hold the ointment in place, this may mean re-applying the ointment several times a day.  Continue this wound care until the wound has healed and is no longer  open.   Itching and mild discomfort is normal during the healing process. However, if you develop pain or severe itching, please call our office.   If you have any discomfort, you can take Tylenol (acetaminophen) or ibuprofen as directed on the bottle. (Please do not take these if you have an allergy to them or cannot take them for another reason).  Some redness, tenderness and white or yellow material in the wound is normal healing.  If the area becomes very sore and red, or develops a thick yellow-green material (pus), it may be infected; please notify us.    If you have stitches, return to clinic as directed to have the stitches removed. You will continue wound care for 2-3 days after the stitches are removed.   Wound healing continues for up to one year following surgery. It is not unusual to experience pain in the scar from time to time during the interval.  If the pain becomes severe or the scar thickens, you should notify the office.    A slight amount of redness in a scar is expected for the first six months.  After six months, the redness will fade and the scar will soften and fade.  The color difference becomes less noticeable with time.  If there are any problems, return for a post-op surgery check at your earliest convenience.  To improve the appearance of the scar, you can use silicone scar gel, cream, or sheets (such as Mederma or Serica) every night for up to one year. These are available over the counter (without a prescription).  Please call our office at [phone] for any questions or concerns.         Actinic keratoses are precancerous spots that appear secondary to cumulative UV radiation exposure/sun exposure over time. They are chronic with expected duration over 1 year. A portion of actinic keratoses will progress to squamous cell carcinoma of the skin. It is not possible to reliably predict which spots will progress to skin cancer and so treatment is recommended  to prevent development of skin cancer.  Recommend daily broad spectrum sunscreen SPF 30+ to sun-exposed areas, reapply every 2 hours as needed.  Recommend staying in the shade or wearing long sleeves, sun glasses (UVA+UVB protection) and wide brim hats (4-inch brim around the entire circumference of the hat). Call for new or changing lesions.       Cryotherapy Aftercare  Wash gently with soap and water everyday.   Apply Vaseline and Band-Aid daily until healed.      Seborrheic Keratosis  What causes seborrheic keratoses? Seborrheic keratoses are harmless, common skin growths that first appear during adult life.  As time goes by, more growths appear.  Some people may develop a large number of them.  Seborrheic keratoses appear  on both covered and uncovered body parts.  They are not caused by sunlight.  The tendency to develop seborrheic keratoses can be inherited.  They vary in color from skin-colored to gray, brown, or even black.  They can be either smooth or have a rough, warty surface.   Seborrheic keratoses are superficial and look as if they were stuck on the skin.  Under the microscope this type of keratosis looks like layers upon layers of skin.  That is why at times the top layer may seem to fall off, but the rest of the growth remains and re-grows.    Treatment Seborrheic keratoses do not need to be treated, but can easily be removed in the office.  Seborrheic keratoses often cause symptoms when they rub on clothing or jewelry.  Lesions can be in the way of shaving.  If they become inflamed, they can cause itching, soreness, or burning.  Removal of a seborrheic keratosis can be accomplished by freezing, burning, or surgery. If any spot bleeds, scabs, or grows rapidly, please return to have it checked, as these can be an indication of a skin cancer.   Melanoma ABCDEs  Melanoma is the most dangerous type of skin cancer, and is the leading cause of death from skin disease.  You  are more likely to develop melanoma if you: Have light-colored skin, light-colored eyes, or red or blond hair Spend a lot of time in the sun Tan regularly, either outdoors or in a tanning bed Have had blistering sunburns, especially during childhood Have a close family member who has had a melanoma Have atypical moles or large birthmarks  Early detection of melanoma is key since treatment is typically straightforward and cure rates are extremely high if we catch it early.   The first sign of melanoma is often a change in a mole or a new dark spot.  The ABCDE system is a way of remembering the signs of melanoma.  A for asymmetry:  The two halves do not match. B for border:  The edges of the growth are irregular. C for color:  A mixture of colors are present instead of an even brown color. D for diameter:  Melanomas are usually (but not always) greater than 42m - the size of a pencil eraser. E for evolution:  The spot keeps changing in size, shape, and color.  Please check your skin once per month between visits. You can use a small mirror in front and a large mirror behind you to keep an eye on the back side or your body.   If you see any new or changing lesions before your next follow-up, please call to schedule a visit.  Please continue daily skin protection including broad spectrum sunscreen SPF 30+ to sun-exposed areas, reapplying every 2 hours as needed when you're outdoors.   Staying in the shade or wearing long sleeves, sun glasses (UVA+UVB protection) and wide brim hats (4-inch brim around the entire circumference of the hat) are also recommended for sun protection.    Due to recent changes in healthcare laws, you may see results of your pathology and/or laboratory studies on MyChart before the doctors have had a chance to review them. We understand that in some cases there may be results that are confusing or concerning to you. Please understand that not all results are received at  the same time and often the doctors may need to interpret multiple results in order to provide you with the best plan of  care or course of treatment. Therefore, we ask that you please give [place] 2 business days to thoroughly review all your results before contacting the office for clarification. Should we see a critical lab result, you will be contacted sooner.   If You Need Anything After Your Visit  If you have any questions or concerns for your doctor, please call our main line at [phone] and press option 4 to reach your doctor's medical assistant. If no one answers, please leave a voicemail as directed and we will return your call as soon as possible. Messages left after 4 pm will be answered the following business day.   You may also send [place] a message via [place]. We typically respond to MyChart messages within 1-2 business days.  For prescription refills, please ask your pharmacy to contact our office. Our fax number is [phone].  If you have an urgent issue when the clinic is closed that cannot wait until the next business day, you can page your doctor at the number below.    Please note that while we do our best to be available for urgent issues outside of office hours, we are not available 24/7.   If you have an urgent issue and are unable to reach [place], you may choose to seek medical care at your doctor's office, retail clinic, urgent care center, or emergency room.  If you have a medical emergency, please immediately call 911 or go to the emergency department.  Pager Numbers  - Dr. Nehemiah Massed: [phone]  - Dr. Laurence Ferrari: [phone]  - Dr. Nicole Kindred: [phone]  In the event of inclement weather, please call our main line at [phone] for an update on the status of any delays or closures.  Dermatology Medication Tips: Please keep the boxes that topical medications come in in order to help keep track of the instructions about where and how to use these. Pharmacies  typically print the medication instructions only on the boxes and not directly on the medication tubes.   If your medication is too expensive, please contact our office at [phone] option 4 or send [place] a message through [place].   We are unable to tell what your co-pay for medications will be in advance as this is different depending on your insurance coverage. However, we may be able to find a substitute medication at lower cost or fill out paperwork to get insurance to cover a needed medication.   If a prior authorization is required to get your medication covered by your insurance company, please allow [place] 1-2 business days to complete this process.  Drug prices often vary depending on where the prescription is filled and some pharmacies may offer cheaper prices.  The website www.goodrx.com contains coupons for medications through different pharmacies. The prices here do not account for what the cost may be with help from insurance (it may be cheaper with your insurance), but the website can give you the price if you did not use any insurance.  - You can print the associated coupon and take it with your prescription to the pharmacy.  - You may also stop by our office during regular business hours and pick up a GoodRx coupon card.  - If you need your prescription sent electronically to a different pharmacy, notify our office through [facility] or by phone at [phone] option 4.     Si Usted Necesita Algo Despus de Su Visita  Tambin puede enviarnos un mensaje a travs de Pharmacist, community. Por lo general respondemos  a los mensajes de MyChart en el transcurso de 1 a 2 das hbiles.  Para renovar recetas, por favor pida a su farmacia que se ponga en contacto con nuestra oficina. Harland Dingwall de fax es [place] [phone].  Si tiene un asunto urgente cuando la clnica est cerrada y que no puede esperar hasta el siguiente da hbil, puede llamar/localizar a su doctor(a) al nmero que  aparece a continuacin.   Por favor, tenga en cuenta que aunque hacemos todo lo posible para estar disponibles para asuntos urgentes fuera del horario de [place], no estamos disponibles las 24 horas del da, los 7 das de la [place].   Si tiene un problema urgente y no puede comunicarse con nosotros, puede optar por buscar atencin mdica  en el consultorio de su doctor(a), en una clnica privada, en un centro de atencin urgente o en una sala de emergencias.  Si tiene Engineering geologist, por favor llame inmediatamente al 911 o vaya a la sala de emergencias.  Nmeros de bper  - Dr. Nehemiah Massed: [phone]  - Dra. Moye: [phone]  - Dra. Nicole Kindred: [phone]  En caso de inclemencias del [place], por favor llame a Johnsie Kindred principal al [phone] para una actualizacin sobre el [place] de cualquier retraso o cierre.  Consejos para la medicacin en dermatologa: Por favor, guarde las cajas en las que vienen los medicamentos de uso tpico para ayudarle a seguir las instrucciones sobre dnde y cmo usarlos. Las farmacias generalmente imprimen las instrucciones del medicamento slo en las cajas y no directamente en los tubos del [place].   Si su medicamento es muy caro, por favor, pngase en contacto con Zigmund Daniel llamando al [phone] y presione la opcin 4 o envenos un mensaje a travs de Pharmacist, community.   No podemos decirle cul ser su copago por los medicamentos por adelantado ya que esto es diferente dependiendo de la cobertura de su seguro. Sin embargo, es posible que podamos encontrar un medicamento sustituto a Electrical engineer un formulario para que el seguro cubra el medicamento que se considera necesario.   Si se requiere una autorizacin previa para que su compaa de seguros [place] su medicamento, por favor permtanos de 1 a 2 das hbiles para completar este proceso.  Los precios de los medicamentos varan con frecuencia dependiendo del Environmental consultant de dnde se surte  la receta y alguna farmacias pueden ofrecer precios ms baratos.  El sitio web www.goodrx.com tiene cupones para medicamentos de Airline pilot. Los precios aqu no tienen en cuenta lo que podra costar con la ayuda del seguro (puede ser ms barato con su seguro), pero el sitio web puede darle el precio si no utiliz Research scientist (physical sciences).  - Puede imprimir el cupn correspondiente y llevarlo con su receta a la farmacia.  - Tambin puede pasar por nuestra oficina durante el horario de atencin regular y Charity fundraiser una tarjeta de cupones de GoodRx.  - Si necesita que su receta se enve electrnicamente a una farmacia diferente, informe a nuestra oficina a travs de MyChart de [place] o por telfono llamando al [phone] y presione la opcin 4.

## 2022-11-17 NOTE — Progress Notes (Signed)
Follow-Up Visit   Subjective  Jimmy Rangel is a 77 y.o. male who presents for the following: Annual Exam (6 month ubse, hx of ak, hx of bcc ).  The patient presents for Upper Body Skin Exam (UBSE) for skin cancer screening and mole check.  The patient has spots, moles and lesions to be evaluated, some may be new or changing and the patient has concerns that these could be cancer.   The following portions of the chart were reviewed this encounter and updated as appropriate:      Review of Systems: No other skin or systemic complaints except as noted in HPI or Assessment and Plan.   Objective  Well appearing patient in no apparent distress; mood and affect are within normal limits.  All skin waist up examined.  left postauricular neck 5 mm flesh pearly pink papule      left cheek x 3, left upper back x 1, left elbow x 1, (5) Erythematous stuck-on, waxy papule  left temple x 3, left upper forehead x 3, mid forehead x 1, right forehead x 3, right temple x 1, right cheek x 3, right nasal dorsum x 1, vertex of scalp x 1 (16) Erythematous thin papules/macules with gritty scale.    Assessment & Plan  Neoplasm of uncertain behavior left postauricular neck  Skin / nail biopsy Type of biopsy: tangential   Informed consent: discussed and consent obtained   Patient was prepped and draped in usual sterile fashion: Area prepped with alcohol. Anesthesia: the lesion was anesthetized in a standard fashion   Anesthetic:  1% lidocaine w/ epinephrine 1-100,000 buffered w/ 8.4% NaHCO3 Instrument used: flexible razor blade   Hemostasis achieved with: pressure, aluminum chloride and electrodesiccation   Outcome: patient tolerated procedure well    Destruction of lesion  Destruction method: electrodesiccation and curettage   Informed consent: discussed and consent obtained   Curettage performed in three different directions: Yes   Electrodesiccation performed over the curetted area:  Yes   Final wound size (cm):  0.9 Hemostasis achieved with:  pressure, aluminum chloride and electrodesiccation Outcome: patient tolerated procedure well with no complications   Post-procedure details: wound care instructions given   Additional details:  Mupirocin ointment and Bandaid applied   Specimen 1 - Surgical pathology Differential Diagnosis: r/o bcc  Check Margins: No  R/o bcc    Inflamed seborrheic keratosis (5) left cheek x 3, left upper back x 1, left elbow x 1,  Symptomatic, irritating, patient would like treated.  Destruction of lesion - left cheek x 3, left upper back x 1, left elbow x 1,  Destruction method: cryotherapy   Informed consent: discussed and consent obtained   Lesion destroyed using liquid nitrogen: Yes   Region frozen until ice ball extended beyond lesion: Yes   Outcome: patient tolerated procedure well with no complications   Post-procedure details: wound care instructions given   Additional details:  Prior to procedure, discussed risks of blister formation, small wound, skin dyspigmentation, or rare scar following cryotherapy. Recommend Vaseline ointment to treated areas while healing.   Actinic keratosis (16) left temple x 3, left upper forehead x 3, mid forehead x 1, right forehead x 3, right temple x 1, right cheek x 3, right nasal dorsum x 1, vertex of scalp x 1  Actinic keratoses are precancerous spots that appear secondary to cumulative UV radiation exposure/sun exposure over time. They are chronic with expected duration over 1 year. A portion of actinic keratoses  will progress to squamous cell carcinoma of the skin. It is not possible to reliably predict which spots will progress to skin cancer and so treatment is recommended to prevent development of skin cancer.  Recommend daily broad spectrum sunscreen SPF 30+ to sun-exposed areas, reapply every 2 hours as needed.  Recommend staying in the shade or wearing long sleeves, sun glasses (UVA+UVB  protection) and wide brim hats (4-inch brim around the entire circumference of the hat). Call for new or changing lesions.  Discussed field treatment with PDT vrs topical cream- pt defers  Destruction of lesion - left temple x 3, left upper forehead x 3, mid forehead x 1, right forehead x 3, right temple x 1, right cheek x 3, right nasal dorsum x 1, vertex of scalp x 1  Destruction method: cryotherapy   Informed consent: discussed and consent obtained   Lesion destroyed using liquid nitrogen: Yes   Region frozen until ice ball extended beyond lesion: Yes   Outcome: patient tolerated procedure well with no complications   Post-procedure details: wound care instructions given   Additional details:  Prior to procedure, discussed risks of blister formation, small wound, skin dyspigmentation, or rare scar following cryotherapy. Recommend Vaseline ointment to treated areas while healing.   Lentigines - Scattered tan macules - Due to sun exposure - Benign-appearing, observe - Recommend daily broad spectrum sunscreen SPF 30+ to sun-exposed areas, reapply every 2 hours as needed. - Call for any changes  Seborrheic Keratoses - Stuck-on, waxy, tan-brown papules and/or plaques  - Benign-appearing - Discussed benign etiology and prognosis. - Observe - Call for any changes  Melanocytic Nevi - Tan-brown and/or pink-flesh-colored symmetric macules and papules - Benign appearing on exam today - Observation - Call clinic for new or changing moles - Recommend daily use of broad spectrum spf 30+ sunscreen to sun-exposed areas.   Hemangiomas - Red papules - Discussed benign nature - Observe - Call for any changes  Actinic Damage with PreCancerous Actinic Keratoses Counseling for Topical Chemotherapy Management: Patient exhibits: - Severe, confluent actinic changes with pre-cancerous actinic keratoses that is secondary to cumulative UV radiation exposure over time - Condition that is severe;  chronic, not at goal. - diffuse scaly erythematous macules and papules with underlying dyspigmentation - Discussed Prescription "Field Treatment" topical Chemotherapy for Severe, Chronic Confluent Actinic Changes with Pre-Cancerous Actinic Keratoses Field treatment involves treatment of an entire area of skin that has confluent Actinic Changes (Sun/ Ultraviolet light damage) and PreCancerous Actinic Keratoses by method of PhotoDynamic Therapy (PDT) and/or prescription Topical Chemotherapy agents such as 5-fluorouracil, 5-fluorouracil/calcipotriene, and/or imiquimod.  The purpose is to decrease the number of clinically evident and subclinical PreCancerous lesions to prevent progression to development of skin cancer by chemically destroying early precancer changes that may or may not be visible.  It has been shown to reduce the risk of developing skin cancer in the treated area. As a result of treatment, redness, scaling, crusting, and open sores may occur during treatment course. One or more than one of these methods may be used and may have to be used several times to control, suppress and eliminate the PreCancerous changes. Discussed treatment course, expected reaction, and possible side effects. - Recommend daily broad spectrum sunscreen SPF 30+ to sun-exposed areas, reapply every 2 hours as needed.  - Staying in the shade or wearing long sleeves, sun glasses (UVA+UVB protection) and wide brim hats (4-inch brim around the entire circumference of the hat) are also recommended. - Call for  new or changing lesions.  Discussed   5-fluorouracil/calcipotriene cream  Or PDT  Patient deferred treatment at this time  History of Basal Cell Carcinoma of the Skin Multiple locations see history  - No evidence of recurrence today - Recommend regular full body skin exams - Recommend daily broad spectrum sunscreen SPF 30+ to sun-exposed areas, reapply every 2 hours as needed.  - Call if any new or changing  lesions are noted between office visits  Skin cancer screening performed today. Return in about 6 months (around 05/18/2023) for TBSE. I, Ruthell Rummage, CMA, am acting as scribe for [name], MD.  Documentation: I have reviewed the above documentation for accuracy and completeness, and I agree with the above.  [name] MD

## 2022-11-23 ENCOUNTER — Telehealth: Payer: Self-pay

## 2022-11-23 NOTE — Telephone Encounter (Signed)
Advised pt of bx results/sh ?

## 2022-11-23 NOTE — Telephone Encounter (Signed)
-----   Message from [name], MD sent at 11/23/2022  1:43 PM EST ----- Skin , left postauricular neck BASAL CELL CARCINOMA, NODULAR PATTERN  BCC skin cancer- already treated with EDC at time of biopsy    - please call patient

## 2023-01-05 ENCOUNTER — Telehealth: Payer: Self-pay | Admitting: Diagnostic Neuroimaging

## 2023-01-05 NOTE — Telephone Encounter (Signed)
Received sleep referral for patient from Dr. Anastasia Pall at [facility] Placed in Sleep referrals box

## 2023-02-08 ENCOUNTER — Encounter: Payer: Self-pay | Admitting: Neurology

## 2023-02-08 ENCOUNTER — Ambulatory Visit: Payer: Medicare Other | Admitting: Neurology

## 2023-02-08 VITALS — BP 129/73 | HR 80 | Ht 69.0 in | Wt 205.0 lb

## 2023-02-08 DIAGNOSIS — G4733 Obstructive sleep apnea (adult) (pediatric): Secondary | ICD-10-CM | POA: Diagnosis not present

## 2023-02-08 MED ORDER — ALPHA-LIPOIC ACID 200 MG PO CAPS: ORAL_CAPSULE | ORAL | 0 refills | Status: AC

## 2023-02-08 NOTE — Patient Instructions (Addendum)
77 y.o. year old male  here with:   History of ITpenia. Anemia, iron deficiency, lumbar spine surgery-  low platelets.  Patient patient has been using CPAP for the treatment of obstructive sleep apnea for well over a decade and close to 2 decades.  His machine is not 1 that gives [place] therapeutic data, and he would need a new positive airway pressure machine for ongoing therapy.  In order to check his current level of sleep apnea I would like to do a home sleep test with him.         1)  longstanding  OSA dx and no retest since 2007, 18 years ago,  uses a 77 year old REM STAR pro machine with nasal pillows. Reportedly highly compliant.    2) HST , as he needs to confirm his diagnosis. He has had multiple medical  developments since last PSG, including anemia, neuropathy, DM.   3) neuropathy on pregabalin: NCV is pending. Symptoms have improved on  B 12 oral supplements. Iron deficiency  anemia, received iron IV.  Trial of alpha lipoic acid for neuropathy.   Screening for Sleep Apnea  Sleep apnea is a condition in which breathing pauses or becomes shallow during sleep. Sleep apnea screening is a test to determine if you are at risk for sleep apnea. The test includes a series of questions. It will only takes a few minutes. Your health care provider may ask you to have this test in preparation for surgery or as part of a physical exam. What are the symptoms of sleep apnea? Common symptoms of sleep apnea include: Snoring. Waking up often at night. Daytime sleepiness. Pauses in breathing. Choking or gasping during sleep. Irritability. Forgetfulness. Trouble thinking clearly. Depression. Personality changes. Most people with sleep apnea do not know that they have it. What are the advantages of sleep apnea screening? Getting screened for sleep apnea can help: Ensure your safety. It is important for your health care providers to know whether or not you have sleep apnea, especially if you  are having surgery or have other long-term (chronic) health conditions. Improve your health and allow you to get a better night's rest. Restful sleep can help you: Have more energy. Lose weight. Improve high blood pressure. Improve diabetes management. Prevent stroke. Prevent car accidents. What happens during the screening? Screening usually includes being asked a list of questions about your sleep quality. Some questions you may be asked include: Do you snore? Is your sleep restless? Do you have daytime sleepiness? Has a partner or spouse told you that you stop breathing during sleep? Have you had trouble concentrating or memory loss? What is your age? What is your neck circumference? To measure your neck, keep your back straight and gently wrap the tape measure around your neck. Put the tape measure at the middle of your neck, between your chin and collarbone. What is your sex assigned at birth? Do you have or are you being treated for high blood pressure? If your screening test is positive, you are at risk for the condition. Further testing may be needed to confirm a diagnosis of sleep apnea. Where to find more information You can find screening tools online or at your health care clinic. For more information about sleep apnea screening and healthy sleep, visit these websites: Centers for Disease Control and Prevention: [employer] American Sleep Apnea Association: www.sleepapnea.org Contact a health care provider if: You think that you may have sleep apnea. Summary Sleep apnea screening can  help determine if you are at risk for sleep apnea. It is important for your health care providers to know whether or not you have sleep apnea, especially if you are having surgery or have other chronic health conditions. You may be asked to take a screening test for sleep apnea in preparation for surgery or as part of a physical exam. This information is not intended to replace advice given to  you by your health care provider. Make sure you discuss any questions you have with your health care provider. Document Revised: 09/06/2020 Document Reviewed: 09/06/2020 Elsevier Patient Education  2023 [employer].

## 2023-02-08 NOTE — Progress Notes (Signed)
SLEEP MEDICINE CLINIC    Provider:  Melvyn Novas, MD  Primary Care Physician:  Eartha Inch, MD [address] [place] [address]     Referring Provider: Savaughn, Karwowski, Md [address] [place],  [place] [address]   Primary neurologist : Dr. Rosalyn Gess at [facility], neurology.          Chief Complaint according to patient   Patient presents with:     New Patient (Initial Visit)     Pt with wife, rm 1. States he has been using CPAP 77+ years old.  He was set up through [place]. He is in need of establishing with sleep MD and getting a new machine. Wife states through the CPAP he still snores. He states the current machine is still working though and he uses it every night. Unable to get a DL due to the machine being old. Its a REM STAR [employer].      HISTORY OF PRESENT ILLNESS:  Jimmy Rangel is a 77 y.o. male patient who is seen upon referral on 02/08/2023 from PCP Dr Gerhard Perches  for a new CPAP machine and OSA evaluation. .  Chief concern according to patient :  I am snoring through my CPAP, using a nasal pillow.  2007 Dr Virginia Rochester, [facility], [facility], [address] .   I have the pleasure of seeing RISHABH RINKENBERGER 02/08/23 a right-handed male with a possible sleep disorder.    Sleep relevant medical history: Nocturia 0-2 times, retrognathia. No Sleep walking,  No Tonsillectomy , no wisdom tooth removal- whiplash ate age [age],  3 spinal surgery lumbar- no cervical spine surgery. anemia, got IV iron, started on B12 for neuropathy. DM,   Family medical /sleep history: daughter and brother dx with OSA, brother on CPAP with OSA, insomnia, sleep walkers.    Social history:  Patient is  retired from [employer] , [employer], retired 2006-  and lives in a household with spouse and Careers information officer and a dog.  He has an adult daughter.  The dog sleeps in the bed-  The patient used to work in shifts( night/  rotating,), but none since over 20 years-  Tobacco use: none .  ETOH use; social- wine - 2 glasses at most,  not every week.,  Caffeine intake in form of Coffee( 2  cups) Soda(  coke, no more than 1 / week ) Tea ( when eating out) or energy drinks. Exercise in form of walking- more strolling, yard work.       Sleep habits are as follows: The patient's dinner time is between 5.30 PM. The patient goes to bed at 9.30-10  PM and continues to sleep in intervals of 4 hours, followed by 3 hours, wakes for 0-2 bathroom breaks, the first time at 2-3 AM.  Bedroom a is cool, dark and quiet.  The preferred sleep position is left lateral, with the support of 1-2 pillows. Dreams are reportedly  frequent.   The patient wakes up as the dog wakes him, 5.30 AM . 6  AM is the usual rise time. He reports not feeling refreshed or restored in AM, with symptoms such as dry mouth, no morning headaches, some residual fatigue.  Naps are taken frequently, after lunch, in an armchair-   lasting from 20 to 60 minutes and are refreshing.    Review of Systems: Out of a complete 14 system review, the patient  complains of only the following symptoms, and all other reviewed systems are negative.:  Fatigue, sleepiness , snoring, fragmented sleep,  no Insomnia, no RLS, neuropathy  Nocturia 0-2   How likely are you to doze in the following situations: 0 = not likely, 1 = slight chance, 2 = moderate chance, 3 = high chance   Sitting and Reading? Watching Television? Sitting inactive in a public place (theater or meeting)? As a passenger in a car for an hour without a break? Lying down in the afternoon when circumstances permit? Sitting and talking to someone? Sitting quietly after lunch without alcohol? In a car, while stopped for a few minutes in traffic?   Total = 9/ 24 points   FSS endorsed at 22/ 63 points.   Social History   Socioeconomic History   Marital status: Married    Spouse name: Not on file   Number  of children: Not on file   Years of education: Not on file   Highest education level: Not on file  Occupational History   Not on file  Tobacco Use   Smoking status: Never   Smokeless tobacco: Never  Vaping Use   Vaping Use: Never used  Substance and Sexual Activity   Alcohol use: Yes    Comment: occ   Drug use: No   Sexual activity: Not Currently  Other Topics Concern   Not on file  Social History Narrative   Not on file   Social Determinants of Health   Financial Resource Strain: Not on file  Food Insecurity: Not on file  Transportation Needs: Not on file  Physical Activity: Not on file  Stress: Not on file  Social Connections: Not on file    Family History  Problem Relation Age of Onset   Leukemia Mother    Lung cancer Father     Past Medical History:  Diagnosis Date   Actinic keratosis 10/22/2021   vertex scalp, EDC   Arthritis    Basal cell carcinoma 10/13/2006   Basal cell carcinoma 10/13/2006   right anterior deltoid   Basal cell carcinoma 08/16/2007   left distal posterior deltoid   Basal cell carcinoma 07/31/2009   left zygomatic lat canthus    Basal cell carcinoma 07/14/2010   right medial chest, EDC   Basal cell carcinoma 02/26/2020   left sideburn ED&C   Basal cell carcinoma 02/26/2020   left pretibia ED&C   Basal cell carcinoma 07/02/2020   L mid pretibia   Basal cell carcinoma 05/15/2020   left mid pretibia  superficial    Basal cell carcinoma 02/17/2021   right posterior neck, EDC 6/22   Basal cell carcinoma 07/07/2021   L mid sternum, EDC   Basal cell carcinoma    Basal cell carcinoma 10/22/2021   left lower neck, EDC   Basal cell carcinoma 11/17/2022   left postauricular neck, EDC   Basal cell carcinoma (BCC) 02/17/2021   right upper nasolabial, MOHs 05/01/21   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 coronary CTA in 01/2022: Coronary calcium score 604 (66th  percentile for age and sex) and moderate CAD. FFR showed no significant stenosis.   Calcification of aortic valve 09/20/2018   Aortic valve: Trileaflet; moderately thickened, moderately   calcified leaflets. Valve mobility was restricted.   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   right superior pectoral   Neuropathy    Nodular basal cell carcinoma 06/02/2019   right nasolabial   Primary localized osteoarthritis of right knee    Sleep apnea    uses CPAP, does not know settings   Superficial basal cell carcinoma    left distal anterior deltoid   Superficial basal cell carcinoma 08/16/2007   left mid lateral bicep   Superficial basal cell carcinoma 02/18/2009   right medial pretibial   Superficial basal cell carcinoma 02/18/2009   right lateral calf   Weakness    left leg    Past Surgical History:  Procedure Laterality Date   ANTERIOR LAT LUMBAR FUSION N/A 09/02/2021   Procedure: STAGEI: Anterior lateral lumbar fusion, Lumbar four- lumbar five, lumbar one- lumabr -two;  Surgeon: Bethann Goo, DO;  Location: [facility] OR;  Service: Neurosurgery;  Laterality: N/A;   BACK SURGERY     Lumbar fusion x2, 1997, 2016   CHOLECYSTECTOMY     COLONOSCOPY     ESOPHAGOGASTRODUODENOSCOPY     EYE SURGERY Right 2021   cataract   FRACTURE SURGERY  1972   had to put screws in left elbow   INCISIONAL HERNIA REPAIR N/A 08/09/2020   Procedure: LAPAROSCOPIC REPAIR INCISIONAL HERNIA WITH MESH AND LYSIS OF ADHESIONS;  Surgeon: Gaynelle Adu, MD;  Location: [facility] ORS;  Service: General;  Laterality: N/A;   INJECTION KNEE Left 09/19/2018   Procedure: LEFT KNEE INJECTION;  Surgeon: Salvatore Marvel, MD;  Location[facility] OR;  Service: Orthopedics;  Laterality: Left;   KNEE ARTHROSCOPY Right     spleenectomy     TOTAL KNEE ARTHROPLASTY Right 09/19/2018   Procedure: TOTAL KNEE ARTHROPLASTY;  Surgeon: Salvatore Marvel, MD;  Location[facility] OR;  Service: Orthopedics;  Laterality: Right;     Current Outpatient Medications on File Prior to Visit  Medication Sig Dispense Refill   aspirin EC 81 MG tablet Take 81 mg by mouth daily. Swallow whole.     Carboxymethylcellulose Sodium (REFRESH TEARS OP) Place 1 drop into both eyes daily as needed (Dry eyes).     diclofenac (VOLTAREN) 75 MG EC tablet Take 75 mg by mouth 2 (two) times daily.     fluticasone (FLONASE) 50 MCG/ACT nasal spray Place 2 sprays into both nostrils daily as needed for allergies or rhinitis.      metFORMIN (GLUCOPHAGE) 500 MG tablet Take 500 mg by mouth 2 (two) times daily with a meal.     Multiple Vitamin (MULTIVITAMIN WITH MINERALS) TABS tablet Take 1 tablet by mouth daily. Package from [facility]     Omega-3 1000 MG CAPS Take 1,000 mg by mouth daily.     omeprazole (PRILOSEC) 20 MG capsule Take 20 mg by mouth daily before breakfast.     OVER THE COUNTER MEDICATION Apply 1 application. topically daily as needed (For pain).     pregabalin (LYRICA) 75 MG capsule Take 75 mg by mouth 2 (two) times daily.     rosuvastatin (CRESTOR) 10 MG tablet Take 10 mg by mouth at bedtime.     tamsulosin (FLOMAX) 0.4 MG CAPS capsule Take 0.4 mg by mouth at bedtime.     nitroGLYCERIN (NITROSTAT) 0.4 MG SL tablet Place 1 tablet (0.4 mg total) under the tongue every 5 (five) minutes as needed for  chest pain. 90 tablet 3   No current facility-administered medications on file prior to visit.    Allergies  Allergen Reactions   Atorvastatin Other (See Comments)    Muscle pain   Penicillins Other (See Comments)    "Passed out" Has patient had a PCN reaction causing immediate rash, facial/tongue/throat swelling, SOB or lightheadedness with hypotension: Yes Has patient had a PCN reaction causing severe rash involving mucus membranes or skin necrosis:  No Has patient had a PCN reaction that required hospitalization: No Has patient had a PCN reaction occurring within the last 10 years: No If all of the above answers are "NO", then may proceed with Cephalosporin use.      DIAGNOSTIC DATA (LABS, IMAGING, TESTING) - I reviewed patient records, labs, notes, testing and imaging myself where available.  Lab Results  Component Value Date   WBC 7.9 02/05/2022   HGB 13.4 02/05/2022   HCT 41.5 02/05/2022   MCV 95.0 02/05/2022   PLT 436 (H) 02/05/2022      Component Value Date/Time   NA 139 02/05/2022 1202   K 4.2 02/05/2022 1202   CL 103 02/05/2022 1202   CO2 26 02/05/2022 1202   GLUCOSE 121 (H) 02/05/2022 1202   BUN 14 02/05/2022 1202   CREATININE 0.98 02/05/2022 1202   CALCIUM 9.5 02/05/2022 1202   PROT 8.0 07/31/2020 0926   ALBUMIN 4.3 07/31/2020 0926   AST 30 07/31/2020 0926   ALT 40 07/31/2020 0926   ALKPHOS 65 07/31/2020 0926   BILITOT 0.6 07/31/2020 0926   GFRNONAA >60 02/05/2022 1202   GFRAA >60 10/04/2019 1122   Lab Results  Component Value Date   CHOL 124 02/05/2022   HDL 34 (L) 02/05/2022   LDLCALC 55 02/05/2022   TRIG 177 (H) 02/05/2022   CHOLHDL 3.6 02/05/2022   No results found for: "HGBA1C" No results found for: "VITAMINB12" No results found for: "TSH"  PHYSICAL EXAM:  Today's Vitals   02/08/23 1018  BP: 129/73  Pulse: 80  Weight: 205 lb (93 kg)  Height: 5\' 9"  (1.753 m)   Body mass index is 30.27 kg/m.   Wt Readings from Last 3 Encounters:  02/08/23 205 lb (93 kg)  02/25/22 208 lb 3.2 oz (94.4 kg)  02/05/22 203 lb 14.8 oz (92.5 kg)     Ht Readings from Last 3 Encounters:  02/08/23 5\' 9"  (1.753 m)  02/25/22 5\' 11"  (1.803 m)  02/05/22 5\' 11"  (1.803 m)      General: The patient is awake, alert and appears not in acute distress. The patient is well groomed. Head: Normocephalic, atraumatic. Neck is supple.  Mallampati 2,  neck circumference:16.5  inches . Nasal airflow only on the right  patent. Left sided narrow-   Retrognathia is seen.  Dental status: biological  Cardiovascular:  Regular rate and cardiac rhythm by pulse,  without distended neck veins. Respiratory: Lungs are clear to auscultation.  Skin:  Without evidence of ankle edema, or rash. Trunk: The patient's posture is erect.   NEUROLOGIC EXAM: The patient is awake and alert, oriented to place and time.   Memory subjective described as intact.  Attention span & concentration ability appears normal.  Speech is fluent,  without  dysarthria, dysphonia or aphasia.  Mood and affect are appropriate.   Cranial nerves: no loss of smell or taste reported  Pupils are equal and briskly reactive to light. Funduscopic exam deferred. Cataract surgery  right eye .  Extraocular movements in vertical and horizontal  planes were intact and without nystagmus. No Diplopia. Visual fields by finger perimetry are intact. Hearing was intact to soft voice and finger rubbing.    Facial sensation intact to fine touch.  Facial motor strength is symmetric and tongue and uvula move midline.  Neck ROM : rotation, tilt and flexion extension were normal for age and shoulder shrug was symmetrical.    Motor exam:  Symmetric bulk, tone and ROM.   Normal tone without cog-wheeling, symmetric grip strength .   Sensory:  Fine touch, pinprick and vibration were tested  and  normal.  Proprioception tested in the upper extremities was normal.   Coordination : Rapid alternating movements in the fingers/hands were of normal speed.  The Finger-to-nose maneuver was intact without evidence of ataxia, dysmetria or tremor.   Gait and station: Patient could rise unassisted from a seated position, walked without assistive device.  Stance is of normal width/ base.  Toe and heel walk were deferred.  Deep tendon reflexes: in the upper and lower extremities are symmetric and intact.  Babinski response was deferred .    ASSESSMENT AND PLAN 77 y.o. year old  male  here with:  History of ITpenia. Anemia, iron deficiency, lumbar spine surgery-  low platelets.  Patient patient has been using CPAP for the treatment of obstructive sleep apnea for well over a decade and close to 2 decades.  His machine is not 1 that gives [place] therapeutic data, and he would need a new positive airway pressure machine for ongoing therapy.  In order to check his current level of sleep apnea I would like to do a home sleep test with him.      1)  longstanding  OSA dx and no retest since 2007, 18 years ago,  uses a 77 year old REM STAR pro machine with nasal pillows. Reportedly highly compliant.   2) HST , as he needs to confirm his diagnosis. He has had multiple medical  developments since last PSG, including anemia, neuropathy, DM.  3) neuropathy on pregabalin: NCV is pending. Symptoms have improved on  B 12 oral supplements. Iron deficiency  anemia, received iron IV.  Should consider  alpha lipoic acid.    I plan to follow up either personally or through our NP within 3-5 months after HST  and treatment initiation, if indicated. .   I would like to thank Eartha Inch, MD and Eartha Inch, Md [address] [place],  [place] [address] for allowing me to meet with and to take care of this pleasant patient.   CC: I will share my notes with PCP .  After spending a total time of  45  minutes face to face and additional time for physical and neurologic examination, review of laboratory studies,  personal review of imaging studies, reports and results of other testing and review of referral information / records as far as provided in visit,   Electronically signed by: Melvyn Novas, MD 02/08/2023 10:44 AM  [facility] Board certified by The [employer] of Sleep Medicine and Diplomate of the [employer] of Sleep Medicine. Board certified In Neurology through the ABPN, Fellow of the [employer] of  Neurology. Medical Director of [facility].

## 2023-02-18 ENCOUNTER — Telehealth: Payer: Self-pay | Admitting: Neurology

## 2023-02-18 NOTE — Telephone Encounter (Signed)
HST- [employer] medicare no auth req   Patient is scheduled at [facility] for 03/10/23 at 9 AM  Mailed packet to the patient.

## 2023-03-03 ENCOUNTER — Ambulatory Visit: Payer: Medicare Other | Admitting: Dermatology

## 2023-03-03 ENCOUNTER — Encounter: Payer: Self-pay | Admitting: Dermatology

## 2023-03-03 VITALS — BP 137/75 | HR 66

## 2023-03-03 DIAGNOSIS — L578 Other skin changes due to chronic exposure to nonionizing radiation: Secondary | ICD-10-CM

## 2023-03-03 DIAGNOSIS — D0439 Carcinoma in situ of skin of other parts of face: Secondary | ICD-10-CM | POA: Diagnosis not present

## 2023-03-03 DIAGNOSIS — D492 Neoplasm of unspecified behavior of bone, soft tissue, and skin: Secondary | ICD-10-CM

## 2023-03-03 DIAGNOSIS — L820 Inflamed seborrheic keratosis: Secondary | ICD-10-CM | POA: Diagnosis not present

## 2023-03-03 DIAGNOSIS — L570 Actinic keratosis: Secondary | ICD-10-CM

## 2023-03-03 DIAGNOSIS — X32XXXA Exposure to sunlight, initial encounter: Secondary | ICD-10-CM

## 2023-03-03 DIAGNOSIS — W908XXA Exposure to other nonionizing radiation, initial encounter: Secondary | ICD-10-CM

## 2023-03-03 DIAGNOSIS — Z85828 Personal history of other malignant neoplasm of skin: Secondary | ICD-10-CM

## 2023-03-03 DIAGNOSIS — C4492 Squamous cell carcinoma of skin, unspecified: Secondary | ICD-10-CM

## 2023-03-03 HISTORY — DX: Squamous cell carcinoma of skin, unspecified: C44.92

## 2023-03-03 NOTE — Patient Instructions (Addendum)
Cryotherapy Aftercare  Wash gently with soap and water everyday.   Apply Vaseline and Band-Aid daily until healed.   Wound Care Instructions  Cleanse wound gently with soap and water once a day then pat dry with clean gauze. Apply a thin coat of Petrolatum (petroleum jelly, "Vaseline") over the wound (unless you have an allergy to this). We recommend that you use a new, sterile tube of Vaseline. Do not pick or remove scabs. Do not remove the yellow or white "healing tissue" from the base of the wound.  Cover the wound with fresh, clean, nonstick gauze and secure with paper tape. You may use Band-Aids in place of gauze and tape if the wound is small enough, but would recommend trimming much of the tape off as there is often too much. Sometimes Band-Aids can irritate the skin.  You should call the office for your biopsy report after 1 week if you have not already been contacted.  If you experience any problems, such as abnormal amounts of bleeding, swelling, significant bruising, significant pain, or evidence of infection, please call the office immediately.  FOR ADULT SURGERY PATIENTS: If you need something for pain relief you may take 1 extra strength Tylenol (acetaminophen) AND 2 Ibuprofen (200mg each) together every 4 hours as needed for pain. (do not take these if you are allergic to them or if you have a reason you should not take them.) Typically, you may only need pain medication for 1 to 3 days.      Recommend daily broad spectrum sunscreen SPF 30+ to sun-exposed areas, reapply every 2 hours as needed. Call for new or changing lesions.  Staying in the shade or wearing long sleeves, sun glasses (UVA+UVB protection) and wide brim hats (4-inch brim around the entire circumference of the hat) are also recommended for sun protection.    Due to recent changes in healthcare laws, you may see results of your pathology and/or laboratory studies on MyChart before the doctors have had a chance to  review them. We understand that in some cases there may be results that are confusing or concerning to you. Please understand that not all results are received at the same time and often the doctors may need to interpret multiple results in order to provide you with the best plan of care or course of treatment. Therefore, we ask that you please give us 2 business days to thoroughly review all your results before contacting the office for clarification. Should we see a critical lab result, you will be contacted sooner.   If You Need Anything After Your Visit  If you have any questions or concerns for your doctor, please call our main line at [phone] and press option 4 to reach your doctor's medical assistant. If no one answers, please leave a voicemail as directed and we will return your call as soon as possible. Messages left after 4 pm will be answered the following business day.   You may also send us a message via MyChart. We typically respond to MyChart messages within 1-2 business days.  For prescription refills, please ask your pharmacy to contact our office. Our fax number is [phone].  If you have an urgent issue when the clinic is closed that cannot wait until the next business day, you can page your doctor at the number below.    Please note that while we do our best to be available for urgent issues outside of office hours, we are not available 24/7.     If you have an urgent issue and are unable to reach us, you may choose to seek medical care at your doctor's office, retail clinic, urgent care center, or emergency room.  If you have a medical emergency, please immediately call 911 or go to the emergency department.  Pager Numbers  - Dr. Kowalski: [phone]  - Dr. Moye: [phone]  - Dr. Stewart: [phone]  In the event of inclement weather, please call our main line at [phone] for an update on the status of any delays or closures.  Dermatology Medication  Tips: Please keep the boxes that topical medications come in in order to help keep track of the instructions about where and how to use these. Pharmacies typically print the medication instructions only on the boxes and not directly on the medication tubes.   If your medication is too expensive, please contact our office at [phone] option 4 or send us a message through MyChart.   We are unable to tell what your co-pay for medications will be in advance as this is different depending on your insurance coverage. However, we may be able to find a substitute medication at lower cost or fill out paperwork to get insurance to cover a needed medication.   If a prior authorization is required to get your medication covered by your insurance company, please allow us 1-2 business days to complete this process.  Drug prices often vary depending on where the prescription is filled and some pharmacies may offer cheaper prices.  The website www.goodrx.com contains coupons for medications through different pharmacies. The prices here do not account for what the cost may be with help from insurance (it may be cheaper with your insurance), but the website can give you the price if you did not use any insurance.  - You can print the associated coupon and take it with your prescription to the pharmacy.  - You may also stop by our office during regular business hours and pick up a GoodRx coupon card.  - If you need your prescription sent electronically to a different pharmacy, notify our office through [facility] MyChart or by phone at [phone] option 4.     Si Usted Necesita Algo Despus de Su Visita  Tambin puede enviarnos un mensaje a travs de MyChart. Por lo general respondemos a los mensajes de MyChart en el transcurso de 1 a 2 das hbiles.  Para renovar recetas, por favor pida a su farmacia que se ponga en contacto con nuestra oficina. Nuestro nmero de fax es el [phone].  Si tiene un  asunto urgente cuando la clnica est cerrada y que no puede esperar hasta el siguiente da hbil, puede llamar/localizar a su doctor(a) al nmero que aparece a continuacin.   Por favor, tenga en cuenta que aunque hacemos todo lo posible para estar disponibles para asuntos urgentes fuera del horario de oficina, no estamos disponibles las 24 horas del da, los 7 das de la semana.   Si tiene un problema urgente y no puede comunicarse con nosotros, puede optar por buscar atencin mdica  en el consultorio de su doctor(a), en una clnica privada, en un centro de atencin urgente o en una sala de emergencias.  Si tiene una emergencia mdica, por favor llame inmediatamente al 911 o vaya a la sala de emergencias.  Nmeros de bper  - Dr. Kowalski: [phone]  - Dra. Moye: [phone]  - Dra. Stewart: [phone]  En caso de inclemencias del tiempo, por favor llame a nuestra lnea principal   al [phone] para una actualizacin sobre el estado de cualquier retraso o cierre.  Consejos para la medicacin en dermatologa: Por favor, guarde las cajas en las que vienen los medicamentos de uso tpico para ayudarle a seguir las instrucciones sobre dnde y cmo usarlos. Las farmacias generalmente imprimen las instrucciones del medicamento slo en las cajas y no directamente en los tubos del medicamento.   Si su medicamento es muy caro, por favor, pngase en contacto con nuestra oficina llamando al [phone] y presione la opcin 4 o envenos un mensaje a travs de MyChart.   No podemos decirle cul ser su copago por los medicamentos por adelantado ya que esto es diferente dependiendo de la cobertura de su seguro. Sin embargo, es posible que podamos encontrar un medicamento sustituto a menor costo o llenar un formulario para que el seguro cubra el medicamento que se considera necesario.   Si se requiere una autorizacin previa para que su compaa de seguros cubra su medicamento, por favor  permtanos de 1 a 2 das hbiles para completar este proceso.  Los precios de los medicamentos varan con frecuencia dependiendo del lugar de dnde se surte la receta y alguna farmacias pueden ofrecer precios ms baratos.  El sitio web www.goodrx.com tiene cupones para medicamentos de diferentes farmacias. Los precios aqu no tienen en cuenta lo que podra costar con la ayuda del seguro (puede ser ms barato con su seguro), pero el sitio web puede darle el precio si no utiliz ningn seguro.  - Puede imprimir el cupn correspondiente y llevarlo con su receta a la farmacia.  - Tambin puede pasar por nuestra oficina durante el horario de atencin regular y recoger una tarjeta de cupones de GoodRx.  - Si necesita que su receta se enve electrnicamente a una farmacia diferente, informe a nuestra oficina a travs de MyChart de [facility] o por telfono llamando al [phone] y presione la opcin 4.  

## 2023-03-03 NOTE — Progress Notes (Signed)
Follow-Up Visit   Subjective  Jimmy Rangel is a 77 y.o. male who presents for the following: Spot at left upper forehead. Dur: ~2 months. Will not go away.  Previously frozen.  Also has itchy bump in front of right ear, and scaly spots on scalp that won't clear up.  The patient has spots, moles and lesions to be evaluated, some may be new or changing and the patient has concerns that these could be cancer.    The following portions of the chart were reviewed this encounter and updated as appropriate: medications, allergies, medical history  Review of Systems:  No other skin or systemic complaints except as noted in HPI or Assessment and Plan.  Objective  Well appearing patient in no apparent distress; mood and affect are within normal limits.  A focused examination was performed of the following areas: Face  Relevant physical exam findings are noted in the Assessment and Plan.  Left Upper Forehead 8 mm crusted pink macule       Vertex Scalp x2 (2) Keratotic macules   Right Preauricular Area x1 Erythematous keratotic or waxy stuck-on papule    Assessment & Plan   Neoplasm of skin Left Upper Forehead  Epidermal / dermal shaving  Lesion diameter (cm):  0.8 Informed consent: discussed and consent obtained   Patient was prepped and draped in usual sterile fashion: Area prepped with alcohol. Anesthesia: the lesion was anesthetized in a standard fashion   Anesthetic:  1% lidocaine w/ epinephrine 1-100,000 buffered w/ 8.4% NaHCO3 Instrument used: flexible razor blade   Hemostasis achieved with: pressure, aluminum chloride and electrodesiccation   Outcome: patient tolerated procedure well   Post-procedure details comment:  Ointment and small bandage applied  Destruction of lesion  Destruction method: electrodesiccation and curettage   Informed consent: discussed and consent obtained   Curettage performed in three different directions: Yes   Electrodesiccation  performed over the curetted area: Yes   Final wound size (cm):  1.1 Hemostasis achieved with:  pressure, aluminum chloride and electrodesiccation Outcome: patient tolerated procedure well with no complications   Post-procedure details: wound care instructions given   Additional details:  Mupirocin ointment and Bandaid applied   Specimen 1 - Surgical pathology Differential Diagnosis: ISK, R/O BCC or SCC  Check Margins: No  Hypertrophic actinic keratosis (2) Vertex Scalp x2  Actinic keratoses are precancerous spots that appear secondary to cumulative UV radiation exposure/sun exposure over time. They are chronic with expected duration over 1 year. A portion of actinic keratoses will progress to squamous cell carcinoma of the skin. It is not possible to reliably predict which spots will progress to skin cancer and so treatment is recommended to prevent development of skin cancer.  Recommend daily broad spectrum sunscreen SPF 30+ to sun-exposed areas, reapply every 2 hours as needed.  Recommend staying in the shade or wearing long sleeves, sun glasses (UVA+UVB protection) and wide brim hats (4-inch brim around the entire circumference of the hat). Call for new or changing lesions.  Destruction of lesion - Vertex Scalp x2  Destruction method: cryotherapy   Informed consent: discussed and consent obtained   Lesion destroyed using liquid nitrogen: Yes   Region frozen until ice ball extended beyond lesion: Yes   Outcome: patient tolerated procedure well with no complications   Post-procedure details: wound care instructions given   Additional details:  Prior to procedure, discussed risks of blister formation, small wound, skin dyspigmentation, or rare scar following cryotherapy. Recommend Vaseline ointment to  treated areas while healing.   Inflamed seborrheic keratosis Right Preauricular Area x1  Symptomatic, irritating, patient would like treated.  Destruction of lesion - Right  Preauricular Area x1  Destruction method: cryotherapy   Informed consent: discussed and consent obtained   Lesion destroyed using liquid nitrogen: Yes   Region frozen until ice ball extended beyond lesion: Yes   Outcome: patient tolerated procedure well with no complications   Post-procedure details: wound care instructions given   Additional details:  Prior to procedure, discussed risks of blister formation, small wound, skin dyspigmentation, or rare scar following cryotherapy. Recommend Vaseline ointment to treated areas while healing.    ACTINIC DAMAGE WITH PRECANCEROUS ACTINIC KERATOSES Counseling for Topical Chemotherapy Management: Patient exhibits: - Severe, confluent actinic changes with pre-cancerous actinic keratoses that is secondary to cumulative UV radiation exposure over time - Condition that is severe; chronic, not at goal. - diffuse scaly erythematous macules and papules with underlying dyspigmentation - Discussed Prescription "Field Treatment" topical Chemotherapy for Severe, Chronic Confluent Actinic Changes with Pre-Cancerous Actinic Keratoses Field treatment involves treatment of an entire area of skin that has confluent Actinic Changes (Sun/ Ultraviolet light damage) and PreCancerous Actinic Keratoses by method of PhotoDynamic Therapy (PDT) and/or prescription Topical Chemotherapy agents such as 5-fluorouracil, 5-fluorouracil/calcipotriene, and/or imiquimod.  The purpose is to decrease the number of clinically evident and subclinical PreCancerous lesions to prevent progression to development of skin cancer by chemically destroying early precancer changes that may or may not be visible.  It has been shown to reduce the risk of developing skin cancer in the treated area. As a result of treatment, redness, scaling, crusting, and open sores may occur during treatment course. One or more than one of these methods may be used and may have to be used several times to control, suppress  and eliminate the PreCancerous changes. Discussed treatment course, expected reaction, and possible side effects. - Recommend daily broad spectrum sunscreen SPF 30+ to sun-exposed areas, reapply every 2 hours as needed.  - Staying in the shade or wearing long sleeves, sun glasses (UVA+UVB protection) and wide brim hats (4-inch brim around the entire circumference of the hat) are also recommended. - Call for new or changing lesions.  - Pt declines field treatment, including PDT.  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  - Call if any new or changing lesions are noted between office visits   Return for TBSE As Scheduled.  I, Lawson Radar, CMA, am acting as scribe for Willeen Niece, MD.   Documentation: I have reviewed the above documentation for accuracy and completeness, and I agree with the above.  Willeen Niece, MD

## 2023-03-10 ENCOUNTER — Telehealth: Payer: Self-pay

## 2023-03-10 ENCOUNTER — Ambulatory Visit (INDEPENDENT_AMBULATORY_CARE_PROVIDER_SITE_OTHER): Payer: Medicare Other | Admitting: Neurology

## 2023-03-10 DIAGNOSIS — Z9081 Acquired absence of spleen: Secondary | ICD-10-CM | POA: Diagnosis not present

## 2023-03-10 DIAGNOSIS — G4733 Obstructive sleep apnea (adult) (pediatric): Secondary | ICD-10-CM | POA: Diagnosis not present

## 2023-03-10 DIAGNOSIS — R0683 Snoring: Secondary | ICD-10-CM | POA: Diagnosis not present

## 2023-03-10 DIAGNOSIS — I359 Nonrheumatic aortic valve disorder, unspecified: Secondary | ICD-10-CM

## 2023-03-10 NOTE — Telephone Encounter (Signed)
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

## 2023-03-10 NOTE — Telephone Encounter (Signed)
Advised pt of bx results/sh ?

## 2023-03-11 NOTE — Progress Notes (Signed)
[facility]  Jimmy Rangel 77 year old male 1945/11/16   HOME SLEEP TEST REPORT ( by Watch PAT)   STUDY DATA:  03-11-2023   ORDERING CLINICIAN: Melvyn Novas, MD  REFERRING CLINICIAN: Dr Cyndia Bent   Primary neurologist : Dr. Rosalyn Gess at [facility].      CLINICAL INFORMATION/HISTORY: NEW SLEEP CLINIC patient with known OSA on CPAP and iron deficiency anemia, presenting on 02-08-2023 for an evaluation.  Jimmy Rangel been using CPAP  for 20+ years   He was set up last time through [place]. He is in need of establishing care with sleep MD and getting a new machine. His Wife stated he still snores,  break- through while using his CPAP . He states the current machine is still working though and he uses it every night. 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  He has an adult daughter.  The dog sleeps in the bed- The patient used to work in shifts( night/ rotating,), but none since over 20 years-  Tobacco use: none , ETOH use; social- wine - 2 glasses at most,  not every week.,Caffeine intake in form of Coffee( 2  cups) Soda(  coke, no more than 1 / week ) Tea ( when eating out) or energy drinks. Exercise in form of walking- more strolling, yard work.    Epworth sleepiness score: 9 /24. FSS at 22/ 63 points.    BMI: 30.2 kg/m   Neck Circumference: 16.5"      Sleep Summary:   Total Recording Time (hours, min):    7 hours 30 minutes   Total Sleep Time (hours, min):    6 hours 53 minutes             Percent REM (%):   15.5%      Sleep latency was only 5 minutes, REM sleep latency was 112 minutes, there were only 32 minutes of wakefulness after sleep onset.                                 Respiratory Indices by AASM criteria:   Calculated pAHI (per hour):      28.4/h, and  following Medicare's CMS criteria this would still be 11.7/h                       REM pAHI:    45.8/h, and 22.4/h by CMS criteria                                             NREM pAHI: 25.3/h, and 9.8/h by CMS criteria                            Positional  AHI:   The patient slept for the majority of the night on his left side was 222 minutes of sleep and an AHI of 28.1/h.  This was followed by supine sleep 410 minutes with an AHI of 13 1.5/h, and by right lateral sleep with an AHI of 24.9/h.  Snoring reached  a mean volume of 43 dB and was present for 45% of the total sleep time.                                                Oxygen Saturation Statistics:            O2 Saturation Range (%): Between a nadir at 84% of the maximum saturation of 97% with a mean saturation of 92%                                     O2 Saturation (minutes) <89%:    10.8 minutes       Pulse Rate Statistics:   Pulse Mean (bpm): 63 bpm               Pulse Range:    Between 50 and 92 bpm.  Please note that this HST's data  reflect the heart rate but does not reflect information about cardiac rhythm , limb movements, sleep talking, etc.             IMPRESSION:  This HST confirms the presence of moderate severe sleep apnea following the AASM criteria and mild to moderate sleep apnea following the CMS criteria which rely on 4% oxygen desaturation to score and apnea.  There were no central events noted it is clear that the patient is still snoring and that there is no positional component to this.   RECOMMENDATION: The patient should receive a new CPAP machine as he is an experienced user he will be able to choose his own interface.  This machine will be an auto titration device by [employer] with a setting between 5 and 15 cm water pressure, 2 cm EPR, heated humidifier and interface of patient's choice.    INTERPRETING PHYSICIAN:   Melvyn Novas, MD   Medical Director of [facility] at [facility].

## 2023-03-28 DIAGNOSIS — R0683 Snoring: Secondary | ICD-10-CM | POA: Insufficient documentation

## 2023-03-28 DIAGNOSIS — G4733 Obstructive sleep apnea (adult) (pediatric): Secondary | ICD-10-CM | POA: Insufficient documentation

## 2023-03-28 NOTE — Procedures (Signed)
[facility]  [name] 77 year old male 1946/04/08   HOME SLEEP TEST REPORT ( by Watch PAT)   STUDY DATA:  03-11-2023   ORDERING CLINICIAN: Melvyn Novas, MD  REFERRING CLINICIAN: Dr Cyndia Bent   Primary neurologist : Dr. Rosalyn Gess at [facility].      CLINICAL INFORMATION/HISTORY: NEW SLEEP CLINIC patient with known OSA on CPAP and iron deficiency anemia, presenting on 02-08-2023 for an evaluation.  Jimmy Rangel been using CPAP  for 20+ years   He was set up last time through [place]. He is in need of establishing care with sleep MD and getting a new machine. His Wife stated he still snores,  break- through while using his CPAP . He states the current machine is still working though and he uses it every night. 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  He has an adult daughter.  The dog sleeps in the bed- The patient used to work in shifts( night/ rotating,), but none since over 20 years-  Tobacco use: none , ETOH use; social- wine - 2 glasses at most,  not every week.,Caffeine intake in form of Coffee( 2  cups) Soda(  coke, no more than 1 / week ) Tea ( when eating out) or energy drinks. Exercise in form of walking- more strolling, yard work.    Epworth sleepiness score: 9 /24. FSS at 22/ 63 points.    BMI: 30.2 kg/m   Neck Circumference: 16.5"      Sleep Summary:   Total Recording Time (hours, min):    7 hours 30 minutes   Total Sleep Time (hours, min):    6 hours 53 minutes             Percent REM (%):   15.5%      Sleep latency was only 5 minutes, REM sleep latency was 112 minutes, there were only 32 minutes of wakefulness after sleep onset.                                 Respiratory Indices by AASM criteria:   Calculated pAHI (per hour):      28.4/h, and following  Medicare's CMS criteria this would still be 11.7/h                       REM pAHI:    45.8/h, and 22.4/h by CMS criteria                                             NREM pAHI: 25.3/h, and 9.8/h by CMS criteria                            Positional  AHI:   The patient slept for the majority of the night on his left side was 222 minutes of sleep and an AHI of 28.1/h.  This was followed by supine sleep 410 minutes with an AHI of 13 1.5/h, and by right lateral sleep with an AHI of 24.9/h.  Snoring reached a mean  volume of 43 dB and was present for 45% of the total sleep time.                                                Oxygen Saturation Statistics:            O2 Saturation Range (%): Between a nadir at 84% of the maximum saturation of 97% with a mean saturation of 92%                                     O2 Saturation (minutes) <89%:    10.8 minutes       Pulse Rate Statistics:   Pulse Mean (bpm): 63 bpm               Pulse Range:    Between 50 and 92 bpm.  Please note that this HST's data  reflect the heart rate but does not reflect information about cardiac rhythm , limb movements, sleep talking, etc.             IMPRESSION:  This HST confirms the presence of moderate severe sleep apnea following the AASM criteria and mild to moderate sleep apnea following the CMS criteria which rely on 4% oxygen desaturation to score and apnea.  There were no central events noted it is clear that the patient is still snoring and that there is no positional component to this.   RECOMMENDATION: The patient should receive a new CPAP machine as he is an experienced user he will be able to choose his own interface.  This machine will be an auto titration device by [employer] with a setting between 5 and 15 cm water pressure, 2 cm EPR, heated humidifier and interface of patient's choice.    INTERPRETING PHYSICIAN:   Melvyn Novas, MD   Medical Director of [facility] at [facility] [place].

## 2023-03-28 NOTE — Addendum Note (Signed)
Addended by: Melvyn Novas on: 03/28/2023 06:02 PM   Modules accepted: Orders

## 2023-03-29 ENCOUNTER — Telehealth: Payer: Self-pay | Admitting: Neurology

## 2023-03-29 NOTE — Telephone Encounter (Signed)
-----   Message from Melvyn Novas, MD sent at 03/28/2023  6:01 PM EDT ----- This HST confirms the presence of moderate severe sleep apnea following the AASM criteria (3%)  and mild to moderate sleep apnea following the CMS criteria which rely on 4% oxygen desaturation to score an apnea.   There were no central events noted it is clear that the patient is still snoring and that there is no positional component to this. I recommend continuation of CPAP use.

## 2023-03-29 NOTE — Telephone Encounter (Signed)
I called pt. I advised pt that Dr. Vickey Huger reviewed their sleep study results and found that pt has moderate OSA. Dr. Vickey Huger recommends that pt starts auto CPAP. I reviewed PAP compliance expectations with the pt. Pt is agreeable to starting a CPAP. I advised pt that an order will be sent to a DME, [facility], and Christoper Allegra will call the pt within about one week after they file with the pt's insurance. Christoper Allegra will show the pt how to use the machine, fit for masks, and troubleshoot the CPAP if needed. A follow up appt was made for insurance purposes with Shawnie Dapper, NP on Aug 22,2024 at 8:30 am. Pt verbalized understanding to arrive 15 minutes early and bring their CPAP. Pt verbalized understanding of results. Pt had no questions at this time but was encouraged to call back if questions arise. I have sent the order to [facility] and have received confirmation that they have received the order.

## 2023-04-02 ENCOUNTER — Other Ambulatory Visit: Payer: Self-pay | Admitting: Physical Medicine and Rehabilitation

## 2023-04-02 DIAGNOSIS — G8929 Other chronic pain: Secondary | ICD-10-CM

## 2023-04-08 ENCOUNTER — Ambulatory Visit
Admission: RE | Admit: 2023-04-08 | Discharge: 2023-04-08 | Disposition: A | Discharge status: Home / Self Care | Payer: Medicare Other | Source: Ambulatory Visit | Attending: Physical Medicine and Rehabilitation | Admitting: Physical Medicine and Rehabilitation

## 2023-04-08 DIAGNOSIS — M545 Low back pain, unspecified: Secondary | ICD-10-CM

## 2023-04-08 MED ORDER — MEPERIDINE HCL 50 MG/ML IJ SOLN: 50.0000 mg | Freq: Once | INTRAMUSCULAR | Status: DC | PRN

## 2023-04-08 MED ORDER — DIAZEPAM 5 MG PO TABS: 5.0000 mg | ORAL_TABLET | Freq: Once | ORAL | Status: DC

## 2023-04-08 MED ORDER — ONDANSETRON HCL 4 MG/2ML IJ SOLN: 4.0000 mg | Freq: Once | INTRAMUSCULAR | Status: DC | PRN

## 2023-04-08 MED ORDER — IOPAMIDOL (ISOVUE-M 200) INJECTION 41%
20.0000 mL | Freq: Once | INTRAMUSCULAR | Status: AC
  Administered 2023-04-08: 20 mL via INTRATHECAL

## 2023-04-08 NOTE — Discharge Instructions (Signed)

## 2023-05-17 ENCOUNTER — Encounter: Payer: Medicare Other | Admitting: Dermatology

## 2023-06-02 NOTE — Progress Notes (Unsigned)
PATIENT: Jimmy Rangel DOB: 04-03-1946  REASON FOR VISIT: follow up HISTORY FROM: patient  No chief complaint on file.    HISTORY OF PRESENT ILLNESS:  06/02/23 ALL:  Jimmy Rangel is a 77 y.o. male here today for follow up for OSA on CPAP.  He was seen in consult with Dr Vickey Huger 01/2023 in need of new CPAP machine. Previously managed with [facility]. HST 02/2023 showed "moderate severe sleep apnea following the AASM criteria and mild to moderate sleep apnea following the CMS criteria which rely on 4% oxygen desaturation to score and apnea. There were no central events noted it is clear that the patient is still snoring and that there is no positional component to this." Total AHI 28.4/hr, REM AHI 45.8/h and O2 nadir 84%. AutoPAP advised. Since,     HISTORY: (copied from Dr Dohmeier's previous note)  Jimmy Rangel is a 77 y.o. male patient who is seen upon referral on 02/08/2023 from PCP Dr Gerhard Perches  for a new CPAP machine and OSA evaluation. .  Chief concern according to patient :  I am snoring through my CPAP, using a nasal pillow.  2007 Dr Virginia Rochester, [facility], [facility], [address] .   I have the pleasure of seeing Jimmy Rangel 02/08/23 a right-handed male with a possible sleep disorder.    Sleep relevant medical history: Nocturia 0-2 times, retrognathia. No Sleep walking,  No Tonsillectomy , no wisdom tooth removal- whiplash ate age [age],  3 spinal surgery lumbar- no cervical spine surgery. anemia, got IV iron, started on B12 for neuropathy. DM,   Family medical /sleep history: daughter and brother dx with OSA, brother on CPAP with OSA, insomnia, sleep walkers.    Social history:  Patient is  retired from [employer] , [employer], retired 2006-  and lives in a household with spouse and Careers information officer and a dog.  He has an adult daughter.  The dog sleeps in the bed-  The patient used to work in shifts( night/ rotating,), but none  since over 20 years-  Tobacco use: none .  ETOH use; social- wine - 2 glasses at most,  not every week.,  Caffeine intake in form of Coffee( 2  cups) Soda(  coke, no more than 1 / week ) Tea ( when eating out) or energy drinks. Exercise in form of walking- more strolling, yard work.     Sleep habits are as follows: The patient's dinner time is between 5.30 PM. The patient goes to bed at 9.30-10  PM and continues to sleep in intervals of 4 hours, followed by 3 hours, wakes for 0-2 bathroom breaks, the first time at 2-3 AM.  Bedroom a is cool, dark and quiet.  The preferred sleep position is left lateral, with the support of 1-2 pillows. Dreams are reportedly  frequent.   The patient wakes up as the dog wakes him, 5.30 AM . 6  AM is the usual rise time. He reports not feeling refreshed or restored in AM, with symptoms such as dry mouth, no morning headaches, some residual fatigue.  Naps are taken frequently, after lunch, in an armchair-   lasting from 20 to 60 minutes and are refreshing.    REVIEW OF SYSTEMS: Out of a complete 14 system review of symptoms, the patient complains only of the following symptoms, and all other reviewed systems are negative.  ESS:  /24, previously 9/24  ALLERGIES: Allergies  Allergen Reactions  Atorvastatin Other (See Comments)    Muscle pain   Penicillins Other (See Comments)    "Passed out" Has patient had a PCN reaction causing immediate rash, facial/tongue/throat swelling, SOB or lightheadedness with hypotension: Yes Has patient had a PCN reaction causing severe rash involving mucus membranes or skin necrosis: No Has patient had a PCN reaction that required hospitalization: No Has patient had a PCN reaction occurring within the last 10 years: No If all of the above answers are "NO", then may proceed with Cephalosporin use.     HOME MEDICATIONS: Outpatient Medications Prior to Visit  Medication Sig Dispense Refill   Alpha-Lipoic Acid 200 MG CAPS Once  a day po 30 capsule 0   aspirin EC 81 MG tablet Take 81 mg by mouth daily. Swallow whole.     Carboxymethylcellulose Sodium (REFRESH TEARS OP) Place 1 drop into both eyes daily as needed (Dry eyes).     diclofenac (VOLTAREN) 75 MG EC tablet Take 75 mg by mouth 2 (two) times daily.     fluticasone (FLONASE) 50 MCG/ACT nasal spray Place 2 sprays into both nostrils daily as needed for allergies or rhinitis.      metFORMIN (GLUCOPHAGE) 500 MG tablet Take 500 mg by mouth 2 (two) times daily with a meal.     Multiple Vitamin (MULTIVITAMIN WITH MINERALS) TABS tablet Take 1 tablet by mouth daily. Package from [facility]     nitroGLYCERIN (NITROSTAT) 0.4 MG SL tablet Place 1 tablet (0.4 mg total) under the tongue every 5 (five) minutes as needed for chest pain. 90 tablet 3   Omega-3 1000 MG CAPS Take 1,000 mg by mouth daily.     omeprazole (PRILOSEC) 20 MG capsule Take 20 mg by mouth daily before breakfast.     OVER THE COUNTER MEDICATION Apply 1 application. topically daily as needed (For pain).     pregabalin (LYRICA) 75 MG capsule Take 75 mg by mouth 2 (two) times daily.     rosuvastatin (CRESTOR) 10 MG tablet Take 10 mg by mouth at bedtime.     tamsulosin (FLOMAX) 0.4 MG CAPS capsule Take 0.4 mg by mouth at bedtime.     No facility-administered medications prior to visit.    PAST MEDICAL HISTORY: Past Medical History:  Diagnosis Date   Actinic keratosis 10/22/2021   vertex scalp, EDC   Arthritis    Basal cell carcinoma 10/13/2006   Basal cell carcinoma 10/13/2006   right anterior deltoid   Basal cell carcinoma 08/16/2007   left distal posterior deltoid   Basal cell carcinoma 07/31/2009   left zygomatic lat canthus    Basal cell carcinoma 07/14/2010   right medial chest, EDC   Basal cell carcinoma 02/26/2020   left sideburn ED&C   Basal cell carcinoma 02/26/2020   left pretibia ED&C   Basal cell carcinoma 07/02/2020   L mid pretibia   Basal cell carcinoma 05/15/2020   left mid pretibia   superficial    Basal cell carcinoma 02/17/2021   right posterior neck, EDC 6/22   Basal cell carcinoma 07/07/2021   L mid sternum, EDC   Basal cell carcinoma    Basal cell carcinoma 10/22/2021   left lower neck, EDC   Basal cell carcinoma 11/17/2022   left postauricular neck, EDC   Basal cell carcinoma (BCC) 02/17/2021   right upper nasolabial, MOHs 05/01/21   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 coronary CTA  in 01/2022: Coronary calcium score 604 (66th percentile for age and sex) and moderate CAD. FFR showed no significant stenosis.   Calcification of aortic valve 09/20/2018   Aortic valve: Trileaflet; moderately thickened, moderately   calcified leaflets. Valve mobility was restricted.   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   SCC IS, Left Upper Forehead, EDC   Superficial basal cell carcinoma    left distal anterior deltoid   Superficial basal cell carcinoma 08/16/2007   left mid lateral bicep   Superficial basal cell carcinoma 02/18/2009   right medial pretibial   Superficial basal cell carcinoma 02/18/2009   right lateral calf   Weakness    left leg    PAST SURGICAL HISTORY: Past Surgical History:  Procedure Laterality Date   ANTERIOR LAT LUMBAR FUSION N/A 09/02/2021   Procedure: STAGEI: Anterior lateral lumbar fusion, Lumbar four- lumbar five, lumbar one- lumabr -two;  Surgeon: Bethann Goo, DO;  Location: [facility] OR;  Service:  Neurosurgery;  Laterality: N/A;   BACK SURGERY     Lumbar fusion x2, 1997, 2016   CHOLECYSTECTOMY     COLONOSCOPY     ESOPHAGOGASTRODUODENOSCOPY     EYE SURGERY Right 2021   cataract   FRACTURE SURGERY  1972   had to put screws in left elbow   INCISIONAL HERNIA REPAIR N/A 08/09/2020   Procedure: LAPAROSCOPIC REPAIR INCISIONAL HERNIA WITH MESH AND LYSIS OF ADHESIONS;  Surgeon: Gaynelle Adu, MD;  Location: [facility] ORS;  Service: General;  Laterality: N/A;   INJECTION KNEE Left 09/19/2018   Procedure: LEFT KNEE INJECTION;  Surgeon: Salvatore Marvel, MD;  Location: [facility] OR;  Service: Orthopedics;  Laterality: Left;   KNEE ARTHROSCOPY Right    spleenectomy     TOTAL KNEE ARTHROPLASTY Right 09/19/2018   Procedure: TOTAL KNEE ARTHROPLASTY;  Surgeon: Salvatore Marvel, MD;  Location: [facility] OR;  Service: Orthopedics;  Laterality: Right;    FAMILY HISTORY: Family History  Problem Relation Age of Onset   Leukemia Mother    Lung cancer Father     SOCIAL HISTORY: Social History   Socioeconomic History   Marital status: Married    Spouse name: Not on file   Number of children: Not on file   Years of education: Not on file   Highest education level: Not on file  Occupational History   Not on file  Tobacco Use   Smoking status: Never   Smokeless tobacco: Never  Vaping Use   Vaping status: Never Used  Substance and Sexual Activity   Alcohol use: Yes    Comment: occ   Drug use: No   Sexual activity: Not Currently  Other Topics Concern   Not on file  Social History Narrative   Not on file   Social Determinants of Health   Financial Resource Strain: Low Risk  (12/21/2022)   Received from [facility], [facility]   Overall Financial Resource Strain (CARDIA)  Difficulty of Paying Living Expenses: Not hard at all  Food Insecurity: No Food Insecurity (12/21/2022)   Received from [facility], [facility]   Hunger Vital Sign    Worried About Running Out of Food in the Last Year: Never  true    Ran Out of Food in the Last Year: Never true  Transportation Needs: No Transportation Needs (12/21/2022)   Received from [facility], [facility]   PRAPARE - Transportation    Lack of Transportation (Medical): No    Lack of Transportation (Non-Medical): No  Physical Activity: Inactive (12/21/2022)   Received from [facility], [facility]   Exercise Vital Sign    Days of Exercise per Week: 0 days    Minutes of Exercise per Session: 20 min  Stress: No Stress Concern Present (12/21/2022)   Received from [facility], [facility] - Occupational Stress Questionnaire    Feeling of Stress : Not at all  Social Connections: Socially Integrated (12/21/2022)   Received from [facility], [facility]   Social Network    How would you rate your social network (family, work, friends)?: Good participation with social networks  Intimate Partner Violence: Not At Risk (12/21/2022)   Received from [facility], [facility]   HITS    Over the last 12 months how often did your partner physically hurt you?: 1    Over the last 12 months how often did your partner insult you or talk down to you?: 1    Over the last 12 months how often did your partner threaten you with physical harm?: 1    Over the last 12 months how often did your partner scream or curse at you?: 1     PHYSICAL EXAM  There were no vitals filed for this visit. There is no height or weight on file to calculate BMI.  Generalized: Well developed, in no acute distress  Cardiology: normal rate and rhythm, no murmur noted Respiratory: clear to auscultation bilaterally  Neurological examination  Mentation: Alert oriented to time, place, history taking. Follows all commands speech and language fluent Cranial nerve II-XII: Pupils were equal round reactive to light. Extraocular movements were full, visual field were full  Motor: The motor testing reveals 5 over 5 strength of all 4  extremities. Good symmetric motor tone is noted throughout.  Gait and station: Gait is normal.    DIAGNOSTIC DATA (LABS, IMAGING, TESTING) - I reviewed patient records, labs, notes, testing and imaging myself where available.      No data to display           Lab Results  Component Value Date   WBC 7.9 02/05/2022   HGB 13.4 02/05/2022   HCT 41.5 02/05/2022   MCV 95.0 02/05/2022   PLT 436 (H) 02/05/2022      Component Value Date/Time   NA 139 02/05/2022 1202   K 4.2 02/05/2022 1202   CL 103 02/05/2022 1202   CO2 26 02/05/2022 1202   GLUCOSE 121 (H) 02/05/2022 1202   BUN 14 02/05/2022 1202   CREATININE 0.98 02/05/2022 1202   CALCIUM 9.5 02/05/2022 1202   PROT 8.0 07/31/2020 0926   ALBUMIN 4.3 07/31/2020 0926   AST 30 07/31/2020 0926   ALT 40 07/31/2020 0926   ALKPHOS 65 07/31/2020 0926   BILITOT 0.6 07/31/2020 0926   GFRNONAA >60 02/05/2022 1202   GFRAA >60 10/04/2019 1122   Lab Results  Component Value Date  CHOL 124 02/05/2022   HDL 34 (L) 02/05/2022   LDLCALC 55 02/05/2022   TRIG 177 (H) 02/05/2022   CHOLHDL 3.6 02/05/2022   No results found for: "HGBA1C" No results found for: "VITAMINB12" No results found for: "TSH"   ASSESSMENT AND PLAN 77 y.o. year old male  has a past medical history of Actinic keratosis (10/22/2021), Arthritis, Basal cell carcinoma (10/13/2006), Basal cell carcinoma (10/13/2006), Basal cell carcinoma (08/16/2007), Basal cell carcinoma (07/31/2009), Basal cell carcinoma (07/14/2010), Basal cell carcinoma (02/26/2020), Basal cell carcinoma (02/26/2020), Basal cell carcinoma (07/02/2020), Basal cell carcinoma (05/15/2020), Basal cell carcinoma (02/17/2021), Basal cell carcinoma (07/07/2021), Basal cell carcinoma, Basal cell carcinoma (10/22/2021), Basal cell carcinoma (11/17/2022), Basal cell carcinoma (BCC) (02/17/2021), Basosquamous carcinoma of skin (08/16/2007), CAD (coronary artery disease), Calcification of aortic valve (09/20/2018),  Chronic back pain, GERD (gastroesophageal reflux disease), Headache, Heart murmur, History of bronchitis as a child, Hyperlipidemia, Infiltrative basal cell carcinoma (02/15/2007), Neuropathy, Nodular basal cell carcinoma (06/02/2019), Primary localized osteoarthritis of right knee, Sleep apnea, Squamous cell carcinoma of skin (03/03/2023), Superficial basal cell carcinoma, Superficial basal cell carcinoma (08/16/2007), Superficial basal cell carcinoma (02/18/2009), Superficial basal cell carcinoma (02/18/2009), and Weakness. here with   No diagnosis found.    Jimmy Rangel is doing well on CPAP therapy. Compliance report reveals ***. *** was encouraged to continue using CPAP nightly and for greater than 4 hours each night. We will update supply orders as indicated. Risks of untreated sleep apnea review and education materials provided. Healthy lifestyle habits encouraged. *** will follow up in ***, sooner if needed. *** verbalizes understanding and agreement with this plan.    No orders of the defined types were placed in this encounter.    No orders of the defined types were placed in this encounter.     Shawnie Dapper, FNP-C 06/02/2023, 10:04 AM [facility] [address] [place], [place] [address] [phone]

## 2023-06-02 NOTE — Patient Instructions (Signed)

## 2023-06-03 ENCOUNTER — Ambulatory Visit: Payer: Medicare Other | Admitting: Family Medicine

## 2023-06-03 ENCOUNTER — Encounter: Payer: Self-pay | Admitting: Family Medicine

## 2023-06-03 VITALS — BP 135/77 | HR 75 | Ht 70.0 in | Wt 208.0 lb

## 2023-06-03 DIAGNOSIS — G4733 Obstructive sleep apnea (adult) (pediatric): Secondary | ICD-10-CM

## 2023-06-08 ENCOUNTER — Ambulatory Visit: Payer: Medicare Other | Admitting: Dermatology

## 2023-06-08 ENCOUNTER — Encounter: Payer: Self-pay | Admitting: Dermatology

## 2023-06-08 DIAGNOSIS — I788 Other diseases of capillaries: Secondary | ICD-10-CM

## 2023-06-08 DIAGNOSIS — L570 Actinic keratosis: Secondary | ICD-10-CM

## 2023-06-08 DIAGNOSIS — L821 Other seborrheic keratosis: Secondary | ICD-10-CM

## 2023-06-08 DIAGNOSIS — D492 Neoplasm of unspecified behavior of bone, soft tissue, and skin: Secondary | ICD-10-CM

## 2023-06-08 DIAGNOSIS — W908XXA Exposure to other nonionizing radiation, initial encounter: Secondary | ICD-10-CM

## 2023-06-08 DIAGNOSIS — Z1283 Encounter for screening for malignant neoplasm of skin: Secondary | ICD-10-CM | POA: Diagnosis not present

## 2023-06-08 DIAGNOSIS — L820 Inflamed seborrheic keratosis: Secondary | ICD-10-CM

## 2023-06-08 DIAGNOSIS — D229 Melanocytic nevi, unspecified: Secondary | ICD-10-CM

## 2023-06-08 DIAGNOSIS — Z85828 Personal history of other malignant neoplasm of skin: Secondary | ICD-10-CM

## 2023-06-08 DIAGNOSIS — Z872 Personal history of diseases of the skin and subcutaneous tissue: Secondary | ICD-10-CM

## 2023-06-08 DIAGNOSIS — L578 Other skin changes due to chronic exposure to nonionizing radiation: Secondary | ICD-10-CM

## 2023-06-08 DIAGNOSIS — L814 Other melanin hyperpigmentation: Secondary | ICD-10-CM | POA: Diagnosis not present

## 2023-06-08 DIAGNOSIS — C44719 Basal cell carcinoma of skin of left lower limb, including hip: Secondary | ICD-10-CM

## 2023-06-08 DIAGNOSIS — D1801 Hemangioma of skin and subcutaneous tissue: Secondary | ICD-10-CM

## 2023-06-08 NOTE — Progress Notes (Signed)
Follow-Up Visit   Subjective  Jimmy Rangel is a 77 y.o. male who presents for the following: Skin Cancer Screening and Full Body Skin Exam, hx of BCC, Hx of SCC, hx of precancers.   The patient presents for Total-Body Skin Exam (TBSE) for skin cancer screening and mole check. The patient has spots, moles and lesions to be evaluated, some may be new or changing and the patient may have concern these could be cancer.    The following portions of the chart were reviewed this encounter and updated as appropriate: medications, allergies, medical history  Review of Systems:  No other skin or systemic complaints except as noted in HPI or Assessment and Plan.  Objective  Well appearing patient in no apparent distress; mood and affect are within normal limits.  A full examination was performed including scalp, head, eyes, ears, nose, lips, neck, chest, axillae, abdomen, back, buttocks, bilateral upper extremities, bilateral lower extremities, hands, feet, fingers, toes, fingernails, and toenails. All findings within normal limits unless otherwise noted below.   Relevant physical exam findings are noted in the Assessment and Plan.  central upper back x 1, L cheek x3 (4) Stuck-on, waxy, tan-brown papules -- Discussed benign etiology and prognosis.   R sup helix x 1, R tragus x1, R lateral cheek x2, R jawline x1, R sup forehead x2, R sup to R glabella x1, R mid forehead x1, L temple x1, L antehelix x 1, L sup helix x1 (12) Erythematous thin papules/macules with gritty scale.   left lateral thigh 22 mm Patch with irregular pigment globules and pink amorphous area, blue gray veil Differential: melanoma vs seborrheic keratosis vs pigmented BCC           Assessment & Plan   SKIN CANCER SCREENING PERFORMED TODAY.  ACTINIC DAMAGE - Chronic condition, secondary to cumulative UV/sun exposure - diffuse scaly erythematous macules with underlying dyspigmentation - Recommend daily broad  spectrum sunscreen SPF 30+ to sun-exposed areas, reapply every 2 hours as needed.  - Staying in the shade or wearing long sleeves, sun glasses (UVA+UVB protection) and wide brim hats (4-inch brim around the entire circumference of the hat) are also recommended for sun protection.  - Call for new or changing lesions.  LENTIGINES, SEBORRHEIC KERATOSES, HEMANGIOMAS - Benign normal skin lesions - Benign-appearing - Call for any changes  MELANOCYTIC NEVI - Tan-brown and/or pink-flesh-colored symmetric macules and papules - Benign appearing on exam today - Observation - Call clinic for new or changing moles - Recommend daily use of broad spectrum spf 30+ sunscreen to sun-exposed areas.   HISTORY OF BASAL CELL CARCINOMA OF THE SKIN - No evidence of recurrence today - Recommend regular full body skin exams - Recommend daily broad spectrum sunscreen SPF 30+ to sun-exposed areas, reapply every 2 hours as needed.  - Call if any new or changing lesions are noted between office visits   HISTORY OF SQUAMOUS CELL CARCINOMA OF THE SKIN - No evidence of recurrence today - No lymphadenopathy - Recommend regular full body skin exams - Recommend daily broad spectrum sunscreen SPF 30+ to sun-exposed areas, reapply every 2 hours as needed.  - Call if any new or changing lesions are noted between office visits    Inflamed seborrheic keratosis (4) central upper back x 1, L cheek x3  Symptomatic, irritating, patient would like treated.   Destruction of lesion - central upper back x 1, L cheek x3 (4) Complexity: simple   Destruction method: cryotherapy  Informed consent: discussed and consent obtained   Timeout:  patient name, date of birth, surgical site, and procedure verified Lesion destroyed using liquid nitrogen: Yes   Region frozen until ice ball extended beyond lesion: Yes   Outcome: patient tolerated procedure well with no complications   Post-procedure details: wound care instructions given     AK (actinic keratosis) (12) R sup helix x 1, R tragus x1, R lateral cheek x2, R jawline x1, R sup forehead x2, R sup to R glabella x1, R mid forehead x1, L temple x1, L antehelix x 1, L sup helix x1  Actinic keratoses are precancerous spots that appear secondary to cumulative UV radiation exposure/sun exposure over time. They are chronic with expected duration over 1 year. A portion of actinic keratoses will progress to squamous cell carcinoma of the skin. It is not possible to reliably predict which spots will progress to skin cancer and so treatment is recommended to prevent development of skin cancer.  Recommend daily broad spectrum sunscreen SPF 30+ to sun-exposed areas, reapply every 2 hours as needed.  Recommend staying in the shade or wearing long sleeves, sun glasses (UVA+UVB protection) and wide brim hats (4-inch brim around the entire circumference of the hat). Call for new or changing lesions.   Patient decline 5FU/Calcipotriene cream and PDT  Destruction of lesion - R sup helix x 1, R tragus x1, R lateral cheek x2, R jawline x1, R sup forehead x2, R sup to R glabella x1, R mid forehead x1, L temple x1, L antehelix x 1, L sup helix x1 (12) Complexity: simple   Destruction method: cryotherapy   Informed consent: discussed and consent obtained   Timeout:  patient name, date of birth, surgical site, and procedure verified Lesion destroyed using liquid nitrogen: Yes   Region frozen until ice ball extended beyond lesion: Yes   Outcome: patient tolerated procedure well with no complications   Post-procedure details: wound care instructions given    Neoplasm of skin left lateral thigh  Skin / nail biopsy Type of biopsy: tangential   Informed consent: discussed and consent obtained   Patient was prepped and draped in usual sterile fashion: area prepped with alochol. Anesthesia: the lesion was anesthetized in a standard fashion   Anesthetic:  1% lidocaine w/ epinephrine 1-100,000  buffered w/ 8.4% NaHCO3 Instrument used: flexible razor blade   Hemostasis achieved with: pressure, aluminum chloride and electrodesiccation   Outcome: patient tolerated procedure well   Post-procedure details: wound care instructions given   Post-procedure details comment:  Ointment and small bandage  Specimen 1 - Surgical pathology Differential Diagnosis: R/O  melanoma vs seborrheic keratosis vs pigmented BCC   Check Margins: Yes  Lentigines  Multiple benign nevi  Seborrheic keratoses  Cherry angioma  Actinic elastosis   Lesions concerning for additional BCC Left upper arm pink macule with telangectasia adjacent to previous scar 5 x 5 mm Left lower proximal lateral  leg pink scar like macule 15 x 11 mm - recommended biopsy - Patient elected to watch these areas for changes    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    Return in about 6 months (around 12/09/2023) for TBSE, hx of BCC, hx SCC, hx of AKs .  IAngelique Holm, CMA, am acting as scribe for Elie Goody, MD .   Documentation: I have reviewed the above documentation for accuracy and completeness, and I agree with the  above.  Elie Goody, MD

## 2023-06-08 NOTE — Patient Instructions (Addendum)
Recommend daily broad spectrum sunscreen SPF 30+ to sun-exposed areas, reapply every 2 hours as needed. Call for new or changing lesions.  Staying in the shade or wearing long sleeves, sun glasses (UVA+UVB protection) and wide brim hats (4-inch brim around the entire circumference of the hat) are also recommended for sun protection.      Wound Care Instructions  Cleanse wound gently with soap and water once a day then pat dry with clean gauze. Apply a thin coat of Petrolatum (petroleum jelly, "Vaseline") over the wound (unless you have an allergy to this). We recommend that you use a new, sterile tube of Vaseline. Do not pick or remove scabs. Do not remove the yellow or white "healing tissue" from the base of the wound.  Cover the wound with fresh, clean, nonstick gauze and secure with paper tape. You may use Band-Aids in place of gauze and tape if the wound is small enough, but would recommend trimming much of the tape off as there is often too much. Sometimes Band-Aids can irritate the skin.  You should call the office for your biopsy report after 1 week if you have not already been contacted.  If you experience any problems, such as abnormal amounts of bleeding, swelling, significant bruising, significant pain, or evidence of infection, please call the office immediately.  FOR ADULT SURGERY PATIENTS: If you need something for pain relief you may take 1 extra strength Tylenol (acetaminophen) AND 2 Ibuprofen (200mg  each) together every 4 hours as needed for pain. (do not take these if you are allergic to them or if you have a reason you should not take them.) Typically, you may only need pain medication for 1 to 3 days.                                                                                                                                      Cryotherapy Aftercare  Wash gently with soap and water everyday.   Apply Vaseline and Band-Aid daily until healed.     Due to recent  changes in healthcare laws, you may see results of your pathology and/or laboratory studies on MyChart before the doctors have had a chance to review them. We understand that in some cases there may be results that are confusing or concerning to you. Please understand that not all results are received at the same time and often the doctors may need to interpret multiple results in order to provide you with the best plan of care or course of treatment. Therefore, we ask that you please give [place] 2 business days to thoroughly review all your results before contacting the office for clarification. Should we see a critical lab result, you will be contacted sooner.   If You Need Anything After Your Visit  If you have any questions or concerns for your doctor, please call our main line at [phone] and press option 4 to  reach your doctor's medical assistant. If no one answers, please leave a voicemail as directed and we will return your call as soon as possible. Messages left after 4 pm will be answered the following business day.   You may also send [place] a message via MyChart. We typically respond to MyChart messages within 1-2 business days.  For prescription refills, please ask your pharmacy to contact our office. Our fax number is [phone].  If you have an urgent issue when the clinic is closed that cannot wait until the next business day, you can page your doctor at the number below.    Please note that while we do our best to be available for urgent issues outside of office hours, we are not available 24/7.   If you have an urgent issue and are unable to reach [place], you may choose to seek medical care at your doctor's office, retail clinic, urgent care center, or emergency room.  If you have a medical emergency, please immediately call 911 or go to the emergency department.  Pager Numbers  - Dr. Gwen Pounds: [phone]  - Dr. Roseanne Reno: [phone]  - Dr. Katrinka Blazing: [phone]   In the event  of inclement weather, please call our main line at [phone] for an update on the status of any delays or closures.  Dermatology Medication Tips: Please keep the boxes that topical medications come in in order to help keep track of the instructions about where and how to use these. Pharmacies typically print the medication instructions only on the boxes and not directly on the medication tubes.   If your medication is too expensive, please contact our office at [phone] option 4 or send [place] a message through MyChart.   We are unable to tell what your co-pay for medications will be in advance as this is different depending on your insurance coverage. However, we may be able to find a substitute medication at lower cost or fill out paperwork to get insurance to cover a needed medication.   If a prior authorization is required to get your medication covered by your insurance company, please allow [place] 1-2 business days to complete this process.  Drug prices often vary depending on where the prescription is filled and some pharmacies may offer cheaper prices.  The website www.goodrx.com contains coupons for medications through different pharmacies. The prices here do not account for what the cost may be with help from insurance (it may be cheaper with your insurance), but the website can give you the price if you did not use any insurance.  - You can print the associated coupon and take it with your prescription to the pharmacy.  - You may also stop by our office during regular business hours and pick up a GoodRx coupon card.  - If you need your prescription sent electronically to a different pharmacy, notify our office through [facility] or by phone at [phone] option 4.     Si Usted Necesita Algo Despus de Su Visita  Tambin puede enviarnos un mensaje a travs de Clinical cytogeneticist. Por lo general respondemos a los mensajes de MyChart en el transcurso de 1 a 2 das hbiles.  Para  renovar recetas, por favor pida a su farmacia que se ponga en contacto con nuestra oficina. Annie Sable de fax es [place] [phone].  Si tiene un asunto urgente cuando la clnica est cerrada y que no puede esperar hasta el siguiente da hbil, puede llamar/localizar a su doctor(a) al nmero que aparece a  continuacin.   Por favor, tenga en cuenta que aunque hacemos todo lo posible para estar disponibles para asuntos urgentes fuera del horario de [place], no estamos disponibles las 24 horas del da, los 7 [address]  [address] de la [place].   Si tiene un problema urgente y no puede comunicarse con nosotros, puede optar por buscar atencin mdica  en el consultorio de su doctor(a), en una clnica privada, en un centro de atencin urgente o en una sala de emergencias.  Si tiene Engineer, drilling, por favor llame inmediatamente al 911 o vaya a la sala de emergencias.  Nmeros de bper  - Dr. Gwen Pounds: [phone]  - Dra. Roseanne Reno: [phone]  - Dr. Katrinka Blazing: [phone]   En caso de inclemencias del tiempo, por favor llame a Lacy Duverney principal al [phone] para una actualizacin sobre el [place] de cualquier retraso o cierre.  Consejos para la medicacin en dermatologa: Por favor, guarde las cajas en las que vienen los medicamentos de uso tpico para ayudarle a seguir las instrucciones sobre dnde y cmo usarlos. Las farmacias generalmente imprimen las instrucciones del medicamento slo en las cajas y no directamente en los tubos del [place].   Si su medicamento es muy caro, por favor, pngase en contacto con Rolm Gala llamando al [phone] y presione la opcin 4 o envenos un mensaje a travs de Clinical cytogeneticist.   No podemos decirle cul ser su copago por los medicamentos por adelantado ya que esto es diferente dependiendo de la cobertura de su seguro. Sin embargo, es posible que podamos encontrar un medicamento sustituto a Audiological scientist un formulario para que el seguro cubra el  medicamento que se considera necesario.   Si se requiere una autorizacin previa para que su compaa de seguros [place] su medicamento, por favor permtanos de 1 a 2 das hbiles para completar [address]  Los precios de los medicamentos varan con frecuencia dependiendo del Environmental consultant de dnde se surte la receta y alguna farmacias pueden ofrecer precios ms baratos.  El sitio web www.goodrx.com tiene cupones para medicamentos de Health and safety inspector. Los precios aqu no tienen en cuenta lo que podra costar con la ayuda del seguro (puede ser ms barato con su seguro), pero el sitio web puede darle el precio si no utiliz Tourist information centre manager.  - Puede imprimir el cupn correspondiente y llevarlo con su receta a la farmacia.  - Tambin puede pasar por nuestra oficina durante el horario de atencin regular y Education officer, museum una tarjeta de cupones de GoodRx.  - Si necesita que su receta se enve electrnicamente a una farmacia diferente, informe a nuestra oficina a travs de MyChart de [place] o por telfono llamando al [phone] y presione la opcin 4.

## 2023-06-15 ENCOUNTER — Telehealth: Payer: Self-pay

## 2023-06-15 NOTE — Telephone Encounter (Signed)
Patient advised of BX results and has decided to do [facility] with Dr. Katrinka Blazing. Patient scheduled. aw

## 2023-06-15 NOTE — Telephone Encounter (Signed)
Called patient. N/A. LMOVM to C/B to discuss pathology results and treatment options.

## 2023-06-15 NOTE — Telephone Encounter (Signed)
-----   Message from [place] sent at 06/14/2023  8:49 PM EDT ----- Diagnosis: Superficial Basal Cell Carcinoma  Please call to share diagnosis and discuss treatment options. Please message me with patient's choice and schedule ED&C or prescribe imiquimod with 3 month follow up for recheck to ensure clearance (assuming patient completes treatment course)  Explanation: your biopsy shows a basal cell skin cancer limited to the top layer of skin. This is the most common kind of skin cancer and is caused by damage from sun exposure. Basal cell skin cancers almost never spread beyond the skin, so they are not dangerous to your overall health. However, they will continue to grow, can bleed, cause nonhealing wounds, and disrupt nearby structures unless fully treated.  Treatment option 1: you return for a brief appointment where I perform electrodesiccation and curettage [facility]). This involves three rounds of scraping and burning to destroy the skin cancer. It has about an 85% cure rate and leaves a round wound slightly larger than the skin cancer and leaves a round white scar. No additional pathology is done. If the skin cancer comes back, we would need to do a surgery to remove it.   Treatment option 2: imiquimod is a cream that helps your immune system clear the skin cancer. You will apply the cream on weekdays for 6 weeks. If redness and irritation develop, take 3 days off before restarting. If an open sore develops, stop the cream and send [place] a message on MyChart or call us.  For option 2: Free text instructions and copy paste: "apply the cream once daily 5 days per week, prior to normal sleeping hours, for 6 weeks. Leave it on your skin for ~8 hours, then remove with mild soap and water. Apply enough cream to cover the treatment area, including a quarter inch of skin surrounding the tumor." Prescribe 12 packets with 2 refills.

## 2023-06-29 ENCOUNTER — Encounter: Payer: Self-pay | Admitting: Dermatology

## 2023-06-29 ENCOUNTER — Ambulatory Visit: Payer: Medicare Other | Admitting: Dermatology

## 2023-06-29 VITALS — BP 158/80 | HR 64

## 2023-06-29 DIAGNOSIS — C44719 Basal cell carcinoma of skin of left lower limb, including hip: Secondary | ICD-10-CM

## 2023-06-29 NOTE — Patient Instructions (Signed)

## 2023-06-29 NOTE — Progress Notes (Signed)
   Follow-Up Visit   Subjective  Jimmy Rangel is a 77 y.o. male who presents for the following: EDC of biopsy proven superficial BCC at left lateral thigh   The following portions of the chart were reviewed this encounter and updated as appropriate: medications, allergies, medical history  Review of Systems:  No other skin or systemic complaints except as noted in HPI or Assessment and Plan.  Objective  Well appearing patient in no apparent distress; mood and affect are within normal limits.  A focused examination was performed of the following areas: Left lateral thigh  Relevant exam findings are noted in the Assessment and Plan.  Left Thigh - lateral Healing biopsy site with central eschar    Assessment & Plan     Basal cell carcinoma (BCC) of skin of left lower extremity including hip Left Thigh - lateral  Destruction of lesion  Destruction method: electrodesiccation and curettage   Informed consent: discussed and consent obtained   Timeout:  patient name, date of birth, surgical site, and procedure verified Anesthesia: the lesion was anesthetized in a standard fashion   Anesthetic:  1% lidocaine w/ epinephrine 1-100,000 buffered w/ 8.4% NaHCO3 Curettage performed in three different directions: Yes   Electrodesiccation performed over the curetted area: Yes   Curettage cycles:  3 Final wound size (cm):  2 Hemostasis achieved with:  electrodesiccation Outcome: patient tolerated procedure well with no complications   Post-procedure details: sterile dressing applied and wound care instructions given   Dressing type: petrolatum      Return for TBSE As Scheduled.  I, Lawson Radar, CMA, am acting as scribe for Elie Goody, MD.   Documentation: I have reviewed the above documentation for accuracy and completeness, and I agree with the above.  Elie Goody, MD

## 2023-07-02 ENCOUNTER — Encounter: Payer: Self-pay | Admitting: Dermatology

## 2023-07-06 MED ORDER — MUPIROCIN 2 % EX OINT: 1.0000 | TOPICAL_OINTMENT | Freq: Two times a day (BID) | CUTANEOUS | 0 refills | Status: DC

## 2023-12-09 ENCOUNTER — Other Ambulatory Visit: Payer: Self-pay | Admitting: Dermatology

## 2023-12-09 ENCOUNTER — Ambulatory Visit: Payer: Medicare Other | Admitting: Dermatology

## 2023-12-09 ENCOUNTER — Encounter: Payer: Self-pay | Admitting: Dermatology

## 2023-12-09 DIAGNOSIS — D492 Neoplasm of unspecified behavior of bone, soft tissue, and skin: Secondary | ICD-10-CM

## 2023-12-09 DIAGNOSIS — L821 Other seborrheic keratosis: Secondary | ICD-10-CM

## 2023-12-09 DIAGNOSIS — L814 Other melanin hyperpigmentation: Secondary | ICD-10-CM | POA: Diagnosis not present

## 2023-12-09 DIAGNOSIS — W908XXA Exposure to other nonionizing radiation, initial encounter: Secondary | ICD-10-CM

## 2023-12-09 DIAGNOSIS — L570 Actinic keratosis: Secondary | ICD-10-CM | POA: Diagnosis not present

## 2023-12-09 DIAGNOSIS — D229 Melanocytic nevi, unspecified: Secondary | ICD-10-CM

## 2023-12-09 DIAGNOSIS — Z1283 Encounter for screening for malignant neoplasm of skin: Secondary | ICD-10-CM

## 2023-12-09 DIAGNOSIS — L858 Other specified epidermal thickening: Secondary | ICD-10-CM

## 2023-12-09 DIAGNOSIS — C44719 Basal cell carcinoma of skin of left lower limb, including hip: Secondary | ICD-10-CM

## 2023-12-09 DIAGNOSIS — Z85828 Personal history of other malignant neoplasm of skin: Secondary | ICD-10-CM

## 2023-12-09 DIAGNOSIS — D1801 Hemangioma of skin and subcutaneous tissue: Secondary | ICD-10-CM

## 2023-12-09 DIAGNOSIS — L578 Other skin changes due to chronic exposure to nonionizing radiation: Secondary | ICD-10-CM

## 2023-12-09 NOTE — Progress Notes (Signed)
 Follow-Up Visit   Subjective  Jimmy Rangel is a 78 y.o. male who presents for the following: Skin Cancer Screening and Full Body Skin Exam hx of BCCs, SCC, Aks, check itchy spot L upper back  The patient presents for Total-Body Skin Exam (TBSE) for skin cancer screening and mole check. The patient has spots, moles and lesions to be evaluated, some may be new or changing and the patient may have concern these could be cancer.    The following portions of the chart were reviewed this encounter and updated as appropriate: medications, allergies, medical history  Review of Systems:  No other skin or systemic complaints except as noted in HPI or Assessment and Plan.  Objective  Well appearing patient in no apparent distress; mood and affect are within normal limits.  A full examination was performed including scalp, head, eyes, ears, nose, lips, neck, chest, axillae, abdomen, back, buttocks, bilateral upper extremities, bilateral lower extremities, hands, feet, fingers, toes, fingernails, and toenails. All findings within normal limits unless otherwise noted below.   Relevant physical exam findings are noted in the Assessment and Plan.  L lower proximal lat leg pink scaly plaque 15 mm    R periorbital   Assessment & Plan   SKIN CANCER SCREENING PERFORMED TODAY.  ACTINIC DAMAGE - Chronic condition, secondary to cumulative UV/sun exposure - diffuse scaly erythematous macules with underlying dyspigmentation - Recommend daily broad spectrum sunscreen SPF 30+ to sun-exposed areas, reapply every 2 hours as needed.  - Staying in the shade or wearing long sleeves, sun glasses (UVA+UVB protection) and wide brim hats (4-inch brim around the entire circumference of the hat) are also recommended for sun protection.  - Call for new or changing lesions.  LENTIGINES, SEBORRHEIC KERATOSES, HEMANGIOMAS - Benign normal skin lesions - Benign-appearing - Call for any changes  MELANOCYTIC  NEVI - Tan-brown and/or pink-flesh-colored symmetric macules and papules - Benign appearing on exam today - Observation - Call clinic for new or changing moles - Recommend daily use of broad spectrum spf 30+ sunscreen to sun-exposed areas.   HISTORY OF BASAL CELL CARCINOMA OF THE SKIN - No evidence of recurrence today - Recommend regular full body skin exams - Recommend daily broad spectrum sunscreen SPF 30+ to sun-exposed areas, reapply every 2 hours as needed.  - Call if any new or changing lesions are noted between office visits  - Multiple - L lat thigh clear  HISTORY OF SQUAMOUS CELL CARCINOMA OF THE SKIN - No evidence of recurrence today - No lymphadenopathy - Recommend regular full body skin exams - Recommend daily broad spectrum sunscreen SPF 30+ to sun-exposed areas, reapply every 2 hours as needed.  - Call if any new or changing lesions are noted between office visits - L upper forehead   CUTANEOUS HORN Exam: R periorbital cutaneous horn  Treatment Plan: Recommended bx, pt prefers to observe  ACTINIC KERATOSIS Exam: pink scaly macules face neck arms hands  Actinic keratoses are precancerous spots that appear secondary to cumulative UV radiation exposure/sun exposure over time. They are chronic with expected duration over 1 year. A portion of actinic keratoses will progress to squamous cell carcinoma of the skin. It is not possible to reliably predict which spots will progress to skin cancer and so treatment is recommended to prevent development of skin cancer.  Recommend daily broad spectrum sunscreen SPF 30+ to sun-exposed areas, reapply every 2 hours as needed.  Recommend staying in the shade or wearing long sleeves, sun  glasses (UVA+UVB protection) and wide brim hats (4-inch brim around the entire circumference of the hat). Call for new or changing lesions.  Treatment Plan: Patient declines LN2 today  NEOPLASM OF SKIN L lower proximal lat leg Skin / nail  biopsy Type of biopsy: tangential   Informed consent: discussed and consent obtained   Timeout: patient name, date of birth, surgical site, and procedure verified   Procedure prep:  Patient was prepped and draped in usual sterile fashion Prep type:  Isopropyl alcohol Anesthesia: the lesion was anesthetized in a standard fashion   Anesthetic:  1% lidocaine w/ epinephrine 1-100,000 buffered w/ 8.4% NaHCO3 Instrument used: DermaBlade   Hemostasis achieved with: pressure and aluminum chloride   Outcome: patient tolerated procedure well   Post-procedure details: sterile dressing applied and wound care instructions given   Dressing type: bandage and petrolatum   Specimen 1 - Surgical pathology Differential Diagnosis: D48.5 BCC vs SCC   Check Margins: No pink scaly plaque 15mm MULTIPLE BENIGN NEVI   LENTIGINES   ACTINIC ELASTOSIS   SEBORRHEIC KERATOSES   CHERRY ANGIOMA   Return in about 6 months (around 06/07/2024) for UBSE, Hx of BCC, Hx of SCC, Hx of AKs.  I, Ardis Rowan, RMA, am acting as scribe for Elie Goody, MD .   Documentation: I have reviewed the above documentation for accuracy and completeness, and I agree with the above.  Elie Goody, MD

## 2023-12-09 NOTE — Patient Instructions (Addendum)

## 2023-12-10 DIAGNOSIS — C4491 Basal cell carcinoma of skin, unspecified: Secondary | ICD-10-CM

## 2023-12-10 HISTORY — DX: Basal cell carcinoma of skin, unspecified: C44.91

## 2023-12-10 LAB — SURGICAL PATHOLOGY

## 2023-12-13 ENCOUNTER — Telehealth: Payer: Self-pay

## 2023-12-13 NOTE — Telephone Encounter (Signed)
-----   Message from [place] sent at 12/13/2023  9:06 AM EST ----- Diagnosis: L lower proximal lat leg :       SUPERFICIAL BASAL CELL CARCINOMA    Please call to share diagnosis and discuss treatment options. Please message me with patient's choice and schedule ED&C or prescribe imiquimod with 3 month follow up for recheck to ensure clearance (assuming patient completes treatment course)  Explanation: your biopsy shows a basal cell skin cancer limited to the top layer of skin. This is the most common kind of skin cancer and is caused by damage from sun exposure. Basal cell skin cancers almost never spread beyond the skin, so they are not dangerous to your overall health. However, they will continue to grow, can bleed, cause nonhealing wounds, and disrupt nearby structures unless fully treated.  Treatment option 1: you return for a brief appointment where I perform electrodesiccation and curettage [facility]). This involves three rounds of scraping and burning to destroy the skin cancer. It has about an 85% cure rate and leaves a round wound slightly larger than the skin cancer and leaves a round white scar. No additional pathology is done. If the skin cancer comes back, we would need to do a surgery to remove it.   Treatment option 2: imiquimod is a cream that helps your immune system clear the skin cancer. You will apply the cream on weekdays for 6 weeks. It has an 88% cure rate. If redness and irritation develop, take 3 days off before restarting. If an open sore develops, stop the cream and send [place] a message on MyChart or call us.  If the cream does not clear the cancer, surgery will be required. ##please make 2 month follow up for recheck  For option 2: Free text instructions and copy paste: "apply cream to your skin cancer once daily on 5 days per week, prior to normal sleeping hours, for 6 weeks. Leave it on your skin for ~8 hours, then remove with mild soap and water. Apply enough cream to  cover the treatment area and a quarter inch of skin surrounding the tumor. If redness and irritation develop, take 3 days off then restart." Prescribe 12 packets with 2 refills.

## 2023-12-16 ENCOUNTER — Ambulatory Visit: Admitting: Dermatology

## 2023-12-16 ENCOUNTER — Telehealth: Payer: Self-pay

## 2023-12-16 NOTE — Telephone Encounter (Signed)
 Called patient but no answer and no VM available. aw

## 2023-12-16 NOTE — Telephone Encounter (Signed)
 Called patient discussed biopsy results, patient would like to come into the office for [facility] of BCC treatment, scheduled patient for 4/17 at 10:45

## 2024-01-27 ENCOUNTER — Ambulatory Visit: Admitting: Dermatology

## 2024-01-27 ENCOUNTER — Encounter: Payer: Self-pay | Admitting: Dermatology

## 2024-01-27 DIAGNOSIS — C44719 Basal cell carcinoma of skin of left lower limb, including hip: Secondary | ICD-10-CM | POA: Diagnosis not present

## 2024-01-27 NOTE — Progress Notes (Signed)
   Follow-Up Visit   Subjective  Jimmy Rangel is a 78 y.o. male who presents for the following: EDC to biopsy proven BCC at L lower proximal lateral leg.  The patient has spots, moles and lesions to be evaluated, some may be new or changing and the patient may have concern these could be cancer.   The following portions of the chart were reviewed this encounter and updated as appropriate: medications, allergies, medical history  Review of Systems:  No other skin or systemic complaints except as noted in HPI or Assessment and Plan.  Objective  Well appearing patient in no apparent distress; mood and affect are within normal limits.  A focused examination was performed of the following areas: L leg  Relevant exam findings are noted in the Assessment and Plan.  Left lower proximal lateral leg Healing biopsy site   Assessment & Plan    BCC (BASAL CELL CARCINOMA), LEG, LEFT Left lower proximal lateral leg Destruction of lesion Complexity: simple   Destruction method: electrodesiccation and curettage   Informed consent: discussed and consent obtained   Timeout:  patient name, date of birth, surgical site, and procedure verified Procedure prep:  Patient was prepped and draped in usual sterile fashion Prep type:  Isopropyl alcohol Anesthesia: the lesion was anesthetized in a standard fashion   Anesthetic:  1% lidocaine w/ epinephrine 1-100,000 local infiltration Curettage performed in three different directions: Yes   Electrodesiccation performed over the curetted area: Yes   Curettage cycles:  3 Final wound size (cm):  2 Hemostasis achieved with:  aluminum chloride and electrodesiccation Outcome: patient tolerated procedure well with no complications   Post-procedure details: sterile dressing applied and wound care instructions given   Dressing type: bandage and petrolatum    Return for As scheduled, w/ Dr. Felipe Horton, UBSE, Hx BCC, Hx SCC, Hx AKs.  I, Jacquelynn Vera, CMA, am  acting as scribe for Harris Liming, MD .   Documentation: I have reviewed the above documentation for accuracy and completeness, and I agree with the above.  Harris Liming, MD

## 2024-01-27 NOTE — Patient Instructions (Addendum)

## 2024-02-14 DIAGNOSIS — E119 Type 2 diabetes mellitus without complications: Secondary | ICD-10-CM | POA: Insufficient documentation

## 2024-05-23 DIAGNOSIS — K5730 Diverticulosis of large intestine without perforation or abscess without bleeding: Secondary | ICD-10-CM | POA: Insufficient documentation

## 2024-06-06 NOTE — Patient Instructions (Incomplete)
 Please continue using your CPAP regularly. While your insurance requires that you use CPAP at least 4 hours each night on 70% of the nights, I recommend, that you not skip any nights and use it throughout the night if you can. Getting used to CPAP and staying with the treatment long term does take time and patience and discipline. Untreated obstructive sleep apnea when it is moderate to severe can have an adverse impact on cardiovascular health and raise her risk for heart disease, arrhythmias, hypertension, congestive heart failure, stroke and diabetes. Untreated obstructive sleep apnea causes sleep disruption, nonrestorative sleep, and sleep deprivation. This can have an impact on your day to day functioning and cause daytime sleepiness and impairment of cognitive function, memory loss, mood disturbance, and problems focussing. Using CPAP regularly can improve these symptoms.  We will update supply orders, today. DME: Kimber SQUIBB: [phone]  Follow up in 1 year

## 2024-06-06 NOTE — Progress Notes (Unsigned)
 PATIENT: Jimmy Rangel DOB: May 19, 1946  REASON FOR VISIT: follow up HISTORY FROM: patient  No chief complaint on file.    HISTORY OF PRESENT ILLNESS:  06/06/24 ALL:  Jimmy Rangel is a 78 y.o. male here today for follow up for OSA on CPAP.  06/03/2023 ALL:  Jimmy Rangel is a 78 y.o. male here today for follow up for OSA on CPAP.  He was seen in consult with Dr Chalice 01/2023 in need of new CPAP machine. Previously managed with [facility]. HST 02/2023 showed moderate severe sleep apnea following the AASM criteria and mild to moderate sleep apnea following the CMS criteria which rely on 4% oxygen desaturation to score and apnea. There were no central events noted it is clear that the patient is still snoring and that there is no positional component to this. Total AHI 28.4/hr, REM AHI 45.8/h and O2 nadir 84%. AutoPAP advised. Since, he reports doing well. He is using therapy nightly for about 7-8 hours. He denies concerns with new machine or supplies. He is using a nasal pillow. He feels he is sleeping better. He has less daytime sleepiness. Rarely taking naps.     HISTORY: (copied from Dr Dohmeier's previous note)  Jimmy Rangel is a 78 y.o. male patient who is seen upon referral on 02/08/2023 from PCP Dr KANDICE Reilly  for a new CPAP machine and OSA evaluation. .  Chief concern according to patient :  I am snoring through my CPAP, using a nasal pillow.  2007 Dr Geroge, [facility], [facility], [address] .   I have the pleasure of seeing Jimmy Rangel 02/08/23 a right-handed male with a possible sleep disorder.    Sleep relevant medical history: Nocturia 0-2 times, retrognathia. No Sleep walking,  No Tonsillectomy , no wisdom tooth removal- whiplash ate age [age],  3 spinal surgery lumbar- no cervical spine surgery. anemia, got IV iron, started on B12 for neuropathy. DM,   Family medical /sleep history: daughter and brother dx with OSA,  brother on CPAP with OSA, insomnia, sleep walkers.    Social history:  Patient is  retired from [employer] , [employer], retired 2006-  and lives in a household with spouse and Careers information officer and a dog.  He has an adult daughter.  The dog sleeps in the bed-  The patient used to work in shifts( night/ rotating,), but none since over 20 years-  Tobacco use: none .  ETOH use; social- wine - 2 glasses at most,  not every week.,  Caffeine intake in form of Coffee( 2  cups) Soda(  coke, no more than 1 / week ) Tea ( when eating out) or energy drinks. Exercise in form of walking- more strolling, yard work.     Sleep habits are as follows: The patient's dinner time is between 5.30 PM. The patient goes to bed at 9.30-10  PM and continues to sleep in intervals of 4 hours, followed by 3 hours, wakes for 0-2 bathroom breaks, the first time at 2-3 AM.  Bedroom a is cool, dark and quiet.  The preferred sleep position is left lateral, with the support of 1-2 pillows. Dreams are reportedly  frequent.   The patient wakes up as the dog wakes him, 5.30 AM . 6  AM is the usual rise time. He reports not feeling refreshed or restored in AM, with symptoms such as dry mouth, no morning headaches, some residual fatigue.  Naps are taken frequently, after lunch, in an armchair-   lasting from 20 to 60 minutes and are refreshing.    REVIEW OF SYSTEMS: Out of a complete 14 system review of symptoms, the patient complains only of the following symptoms, none and all other reviewed systems are negative.  ESS: 4/24, previously 9/24  ALLERGIES: Allergies  Allergen Reactions   Atorvastatin  Other (See Comments)    Muscle pain   Penicillins Other (See Comments)    Passed out Has patient had a PCN reaction causing immediate rash, facial/tongue/throat swelling, SOB or lightheadedness with hypotension: Yes Has patient had a PCN reaction causing severe rash involving mucus membranes or skin necrosis: No Has patient had a PCN  reaction that required hospitalization: No Has patient had a PCN reaction occurring within the last 10 years: No If all of the above answers are NO, then may proceed with Cephalosporin use.     HOME MEDICATIONS: Outpatient Medications Prior to Visit  Medication Sig Dispense Refill   Alpha-Lipoic Acid 200 MG CAPS Once a day po 30 capsule 0   Carboxymethylcellulose Sodium (REFRESH TEARS OP) Place 1 drop into both eyes daily as needed (Dry eyes).     diclofenac (VOLTAREN) 75 MG EC tablet Take 75 mg by mouth 2 (two) times daily.     fluticasone  (FLONASE ) 50 MCG/ACT nasal spray Place 2 sprays into both nostrils daily as needed for allergies or rhinitis.      MOUNJARO 5 MG/0.5ML Pen SMARTSIG:0.5 Milliliter(s) SUB-Q Once a Week     Multiple Vitamin (MULTIVITAMIN WITH MINERALS) TABS tablet Take 1 tablet by mouth daily. Package from [facility]     Omega-3 1000 MG CAPS Take 1,000 mg by mouth daily.     omeprazole (PRILOSEC) 20 MG capsule Take 20 mg by mouth daily before breakfast.     OVER THE COUNTER MEDICATION Apply 1 application. topically daily as needed (For pain).     pregabalin (LYRICA) 75 MG capsule Take 75 mg by mouth 2 (two) times daily.     rosuvastatin  (CRESTOR ) 10 MG tablet Take 10 mg by mouth at bedtime.     tamsulosin  (FLOMAX ) 0.4 MG CAPS capsule Take 0.4 mg by mouth at bedtime.     No facility-administered medications prior to visit.    PAST MEDICAL HISTORY: Past Medical History:  Diagnosis Date   Actinic keratosis 10/22/2021   vertex scalp, EDC   Arthritis    Basal cell carcinoma 10/13/2006   Basal cell carcinoma 10/13/2006   right anterior deltoid   Basal cell carcinoma 08/16/2007   left distal posterior deltoid   Basal cell carcinoma 07/31/2009   left zygomatic lat canthus    Basal cell carcinoma 07/14/2010   right medial chest, EDC   Basal cell carcinoma 02/26/2020   left sideburn ED&C   Basal cell carcinoma 02/26/2020   left pretibia ED&C   Basal cell carcinoma  07/02/2020   L mid pretibia   Basal cell carcinoma 05/15/2020   left mid pretibia  superficial    Basal cell carcinoma 02/17/2021   right posterior neck, EDC 6/22   Basal cell carcinoma 07/07/2021   L mid sternum, EDC   Basal cell carcinoma    Basal cell carcinoma 10/22/2021   left lower neck, EDC   Basal cell carcinoma 11/17/2022   left postauricular neck, EDC   Basal cell carcinoma 06/08/2023   Left lateral thigh. Superficial, pigmented. 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 coronary CTA in 01/2022: Coronary calcium  score 604 (66th percentile for age and sex) and moderate CAD. FFR showed no significant stenosis.   Calcification of aortic valve 09/20/2018   Aortic valve: Trileaflet; moderately thickened, moderately   calcified leaflets. Valve mobility was restricted.   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   SCC IS, Left Upper Forehead, EDC   Superficial basal cell carcinoma    left distal anterior deltoid   Superficial basal cell carcinoma 08/16/2007   left mid lateral bicep   Superficial basal cell carcinoma 02/18/2009   right medial pretibial   Superficial basal cell carcinoma 02/18/2009   right lateral calf   Weakness    left leg     PAST SURGICAL HISTORY: Past Surgical History:  Procedure Laterality Date   ANTERIOR LAT LUMBAR FUSION N/A 09/02/2021   Procedure: STAGEI: Anterior lateral lumbar fusion, Lumbar four- lumbar five, lumbar one- lumabr -two;  Surgeon: Carollee Lani BROCKS, DO;  Location: [facility] OR;  Service: Neurosurgery;  Laterality: N/A;   BACK SURGERY     Lumbar fusion x2, 1997, 2016   CHOLECYSTECTOMY     COLONOSCOPY     ESOPHAGOGASTRODUODENOSCOPY     EYE SURGERY Right 2021   cataract   FRACTURE SURGERY  1972   had to put screws in left elbow   INCISIONAL HERNIA REPAIR N/A 08/09/2020   Procedure: LAPAROSCOPIC REPAIR INCISIONAL HERNIA WITH MESH AND LYSIS OF ADHESIONS;  Surgeon: Tanda Locus, MD;  Location: [facility] ORS;  Service: General;  Laterality: N/A;   INJECTION KNEE Left 09/19/2018   Procedure: LEFT KNEE INJECTION;  Surgeon: Jane Charleston, MD;  [facility];  Service: Orthopedics;  Laterality: Left;   KNEE ARTHROSCOPY Right    spleenectomy     TOTAL KNEE ARTHROPLASTY Right 09/19/2018   Procedure: TOTAL KNEE ARTHROPLASTY;  Surgeon: Jane Charleston, MD;  Location: [facility] OR;  Service: Orthopedics;  Laterality: Right;    FAMILY HISTORY: Family History  Problem Relation Age of Onset   Leukemia Mother    Lung cancer Father     SOCIAL HISTORY: Social History   Socioeconomic History   Marital status: Married    Spouse name: Not on file   Number of children: Not on file   Years of education: Not on file   Highest education level: Not on file  Occupational History   Not on file  Tobacco Use   Smoking status: Never   Smokeless tobacco: Never  Vaping Use   Vaping status: Never Used  Substance and Sexual Activity   Alcohol use: Yes    Comment: occ   Drug use: No   Sexual activity: Not Currently  Other Topics  Concern   Not on file  Social History Narrative   Not on file   Social Drivers of Health   Financial Resource Strain: Low Risk  (03/01/2024)   Received from [facility]   Overall  Financial Resource Strain (CARDIA)    Difficulty of Paying Living Expenses: Not hard at all  Food Insecurity: No Food Insecurity (03/01/2024)   Received from [facility]   Hunger Vital Sign    Within the past 12 months, you worried that your food would run out before you got the money to buy more.: Never true    Within the past 12 months, the food you bought just didn't last and you didn't have money to get more.: Never true  Transportation Needs: No Transportation Needs (03/01/2024)   Received from [facility] - Transportation    Lack of Transportation (Medical): No    Lack of Transportation (Non-Medical): No  Physical Activity: Sufficiently Active (03/01/2024)   Received from [facility]   Exercise Vital Sign    On average, how many days per week do you engage in moderate to strenuous exercise (like a brisk walk)?: 7 days    On average, how many minutes do you engage in exercise at this level?: 30 min  Stress: No Stress Concern Present (03/01/2024)   Received from [facility] - Occupational Stress Questionnaire    Feeling of Stress : Not at all  Social Connections: Socially Integrated (03/01/2024)   Received from [facility]   Social Network    How would you rate your social network (family, work, friends)?: Good participation with social networks  Intimate Partner Violence: Not At Risk (03/01/2024)   Received from [facility]   HITS    Over the last 12 months how often did your partner physically hurt you?: Never    Over the last 12 months how often did your partner insult you or talk down to you?: Never    Over the last 12 months how often did your partner threaten you with physical harm?: Never    Over the last 12 months how often did your partner scream or curse at you?: Never     PHYSICAL EXAM  There were no vitals filed for this visit.  There is no height or weight on file to calculate BMI.  Generalized: Well  developed, in no acute distress  Cardiology: normal rate and rhythm, no murmur noted Respiratory: clear to auscultation bilaterally  Neurological examination  Mentation: Alert oriented to time, place, history taking. Follows all commands speech and language fluent Cranial nerve II-XII: Pupils were equal round reactive to light. Extraocular movements were full, visual field were full  Motor: The motor testing reveals 5 over 5 strength of all 4 extremities. Good symmetric motor tone is noted throughout.  Gait and station: Gait is normal.    DIAGNOSTIC DATA (LABS, IMAGING, TESTING) - I reviewed patient records, labs, notes, testing and imaging myself where available.      No data to display           Lab Results  Component Value Date   WBC 7.9 02/05/2022   HGB 13.4 02/05/2022   HCT 41.5 02/05/2022   MCV 95.0 02/05/2022   PLT 436 (H) 02/05/2022      Component Value Date/Time   NA 139 02/05/2022 1202   K 4.2 02/05/2022 1202   CL 103 02/05/2022 1202   CO2 26 02/05/2022 1202  GLUCOSE 121 (H) 02/05/2022 1202   BUN 14 02/05/2022 1202   CREATININE 0.98 02/05/2022 1202   CALCIUM  9.5 02/05/2022 1202   PROT 8.0 07/31/2020 0926   ALBUMIN  4.3 07/31/2020 0926   AST 30 07/31/2020 0926   ALT 40 07/31/2020 0926   ALKPHOS 65 07/31/2020 0926   BILITOT 0.6 07/31/2020 0926   GFRNONAA >60 02/05/2022 1202   GFRAA >60 10/04/2019 1122   Lab Results  Component Value Date   CHOL 124 02/05/2022   HDL 34 (L) 02/05/2022   LDLCALC 55 02/05/2022   TRIG 177 (H) 02/05/2022   CHOLHDL 3.6 02/05/2022   No results found for: HGBA1C No results found for: VITAMINB12 No results found for: TSH   ASSESSMENT AND PLAN 78 y.o. year old male  has a past medical history of Actinic keratosis (10/22/2021), Arthritis, Basal cell carcinoma (10/13/2006), Basal cell carcinoma (10/13/2006), Basal cell carcinoma (08/16/2007), Basal cell carcinoma (07/31/2009), Basal cell carcinoma (07/14/2010), Basal cell  carcinoma (02/26/2020), Basal cell carcinoma (02/26/2020), Basal cell carcinoma (07/02/2020), Basal cell carcinoma (05/15/2020), Basal cell carcinoma (02/17/2021), Basal cell carcinoma (07/07/2021), Basal cell carcinoma, Basal cell carcinoma (10/22/2021), Basal cell carcinoma (11/17/2022), Basal cell carcinoma (06/08/2023), Basal cell carcinoma (BCC) (02/17/2021), Basosquamous carcinoma of skin (08/16/2007), BCC (basal cell carcinoma of skin) (12/10/2023), CAD (coronary artery disease), Calcification of aortic valve (09/20/2018), Chronic back pain, GERD (gastroesophageal reflux disease), Headache, Heart murmur, History of bronchitis as a child, Hyperlipidemia, Infiltrative basal cell carcinoma (02/15/2007), Neuropathy, Nodular basal cell carcinoma (06/02/2019), Primary localized osteoarthritis of right knee, Sleep apnea, Squamous cell carcinoma of skin (03/03/2023), Superficial basal cell carcinoma, Superficial basal cell carcinoma (08/16/2007), Superficial basal cell carcinoma (02/18/2009), Superficial basal cell carcinoma (02/18/2009), and Weakness. here with   No diagnosis found.     Ozell LITTIE Cleaves is doing well on CPAP therapy. Compliance report reveals excellent compliance. He was encouraged to continue using CPAP nightly and for greater than 4 hours each night. We will update supply orders as indicated. Risks of untreated sleep apnea review and education materials provided. Healthy lifestyle habits encouraged. He will follow up in 1 year, sooner if needed. He verbalizes understanding and agreement with this plan.    No orders of the defined types were placed in this encounter.    No orders of the defined types were placed in this encounter.     Greig Forbes, FNP-C 06/06/2024, 4:36 PM [facility] [address] [place], [place] [address] [phone]

## 2024-06-07 ENCOUNTER — Encounter: Payer: Medicare Other | Admitting: Dermatology

## 2024-06-07 NOTE — Progress Notes (Unsigned)
 Jimmy Rangel

## 2024-06-08 ENCOUNTER — Encounter: Payer: Self-pay | Admitting: Family Medicine

## 2024-06-08 ENCOUNTER — Ambulatory Visit: Payer: Medicare Other | Admitting: Dermatology

## 2024-06-08 ENCOUNTER — Ambulatory Visit: Payer: Medicare Other | Admitting: Family Medicine

## 2024-06-08 VITALS — BP 111/66 | HR 79 | Ht 71.0 in | Wt 181.0 lb

## 2024-06-08 DIAGNOSIS — M545 Low back pain, unspecified: Secondary | ICD-10-CM | POA: Insufficient documentation

## 2024-06-08 DIAGNOSIS — G4733 Obstructive sleep apnea (adult) (pediatric): Secondary | ICD-10-CM | POA: Diagnosis not present

## 2024-06-08 DIAGNOSIS — Z008 Encounter for other general examination: Secondary | ICD-10-CM | POA: Insufficient documentation

## 2024-06-08 DIAGNOSIS — H9190 Unspecified hearing loss, unspecified ear: Secondary | ICD-10-CM | POA: Insufficient documentation

## 2024-06-08 DIAGNOSIS — E119 Type 2 diabetes mellitus without complications: Secondary | ICD-10-CM | POA: Insufficient documentation

## 2024-06-08 DIAGNOSIS — Z7189 Other specified counseling: Secondary | ICD-10-CM | POA: Insufficient documentation

## 2024-06-08 DIAGNOSIS — R7989 Other specified abnormal findings of blood chemistry: Secondary | ICD-10-CM | POA: Insufficient documentation

## 2024-06-08 DIAGNOSIS — F32A Depression, unspecified: Secondary | ICD-10-CM | POA: Insufficient documentation

## 2024-06-08 DIAGNOSIS — D509 Iron deficiency anemia, unspecified: Secondary | ICD-10-CM | POA: Insufficient documentation

## 2025-06-14 ENCOUNTER — Ambulatory Visit: Admitting: Family Medicine

## 2025-07-10 ENCOUNTER — Ambulatory Visit: Admitting: Family Medicine
